# Patient Record
Sex: Female | Born: 1965 | Race: White | Hispanic: No | Smoking: Never smoker
Health system: Southern US, Community
[De-identification: ages and names within clinical notes are randomized; demographics above are authoritative.]

## PROBLEM LIST (undated history)

## (undated) DIAGNOSIS — I341 Nonrheumatic mitral (valve) prolapse: Secondary | ICD-10-CM

## (undated) DIAGNOSIS — D0510 Intraductal carcinoma in situ of unspecified breast: Secondary | ICD-10-CM

## (undated) DIAGNOSIS — C801 Malignant (primary) neoplasm, unspecified: Secondary | ICD-10-CM

## (undated) DIAGNOSIS — K589 Irritable bowel syndrome without diarrhea: Secondary | ICD-10-CM

## (undated) DIAGNOSIS — B019 Varicella without complication: Secondary | ICD-10-CM

## (undated) DIAGNOSIS — I7300 Raynaud's syndrome without gangrene: Secondary | ICD-10-CM

## (undated) DIAGNOSIS — D509 Iron deficiency anemia, unspecified: Principal | ICD-10-CM

## (undated) DIAGNOSIS — J302 Other seasonal allergic rhinitis: Secondary | ICD-10-CM

## (undated) DIAGNOSIS — N200 Calculus of kidney: Secondary | ICD-10-CM

## (undated) HISTORY — PX: HERNIA REPAIR: SHX51

## (undated) HISTORY — DX: Iron deficiency anemia, unspecified: D50.9

## (undated) HISTORY — DX: Intraductal carcinoma in situ of unspecified breast: D05.10

## (undated) HISTORY — DX: Other seasonal allergic rhinitis: J30.2

## (undated) HISTORY — DX: Calculus of kidney: N20.0

## (undated) HISTORY — PX: MASTECTOMY, PARTIAL: SHX709

## (undated) HISTORY — DX: Varicella without complication: B01.9

## (undated) HISTORY — DX: Raynaud's syndrome without gangrene: I73.00

## (undated) HISTORY — PX: TONSILLECTOMY: SUR1361

---

## 1997-11-09 ENCOUNTER — Encounter: Admission: RE | Admit: 1997-11-09 | Discharge: 1997-11-29 | Payer: Self-pay | Admitting: Gynecology

## 1997-12-26 ENCOUNTER — Other Ambulatory Visit: Admission: RE | Admit: 1997-12-26 | Discharge: 1997-12-26 | Payer: Self-pay | Admitting: Gynecology

## 1998-12-27 ENCOUNTER — Other Ambulatory Visit: Admission: RE | Admit: 1998-12-27 | Discharge: 1998-12-27 | Payer: Self-pay | Admitting: Gynecology

## 2000-01-04 ENCOUNTER — Other Ambulatory Visit: Admission: RE | Admit: 2000-01-04 | Discharge: 2000-01-04 | Payer: Self-pay | Admitting: Gynecology

## 2009-12-29 ENCOUNTER — Ambulatory Visit: Payer: Self-pay | Admitting: Diagnostic Radiology

## 2009-12-29 ENCOUNTER — Emergency Department (HOSPITAL_BASED_OUTPATIENT_CLINIC_OR_DEPARTMENT_OTHER): Admission: EM | Admit: 2009-12-29 | Discharge: 2009-12-29 | Payer: Self-pay | Admitting: Emergency Medicine

## 2010-08-23 ENCOUNTER — Ambulatory Visit: Admit: 2010-08-23 | Payer: Self-pay | Admitting: Psychology

## 2010-11-06 LAB — D-DIMER, QUANTITATIVE: D-Dimer, Quant: 0.24 ug/mL-FEU (ref 0.00–0.48)

## 2010-11-06 LAB — HEMOGLOBIN AND HEMATOCRIT, BLOOD
HCT: 29.7 % — ABNORMAL LOW (ref 36.0–46.0)
Hemoglobin: 9.5 g/dL — ABNORMAL LOW (ref 12.0–15.0)

## 2011-07-20 DIAGNOSIS — N200 Calculus of kidney: Secondary | ICD-10-CM

## 2011-07-20 HISTORY — DX: Calculus of kidney: N20.0

## 2011-07-23 ENCOUNTER — Encounter: Payer: Self-pay | Admitting: *Deleted

## 2011-07-23 ENCOUNTER — Emergency Department (INDEPENDENT_AMBULATORY_CARE_PROVIDER_SITE_OTHER): Payer: BC Managed Care – PPO

## 2011-07-23 ENCOUNTER — Emergency Department (HOSPITAL_BASED_OUTPATIENT_CLINIC_OR_DEPARTMENT_OTHER)
Admission: EM | Admit: 2011-07-23 | Discharge: 2011-07-23 | Disposition: A | Discharge status: Home / Self Care | Payer: BC Managed Care – PPO | Attending: Emergency Medicine | Admitting: Emergency Medicine

## 2011-07-23 DIAGNOSIS — R109 Unspecified abdominal pain: Secondary | ICD-10-CM | POA: Insufficient documentation

## 2011-07-23 DIAGNOSIS — N1330 Unspecified hydronephrosis: Secondary | ICD-10-CM | POA: Insufficient documentation

## 2011-07-23 DIAGNOSIS — K589 Irritable bowel syndrome without diarrhea: Secondary | ICD-10-CM | POA: Insufficient documentation

## 2011-07-23 DIAGNOSIS — N201 Calculus of ureter: Secondary | ICD-10-CM

## 2011-07-23 DIAGNOSIS — N134 Hydroureter: Secondary | ICD-10-CM

## 2011-07-23 HISTORY — DX: Irritable bowel syndrome, unspecified: K58.9

## 2011-07-23 HISTORY — DX: Nonrheumatic mitral (valve) prolapse: I34.1

## 2011-07-23 LAB — BASIC METABOLIC PANEL
CO2: 21 mEq/L (ref 19–32)
Calcium: 10 mg/dL (ref 8.4–10.5)

## 2011-07-23 LAB — URINALYSIS, ROUTINE W REFLEX MICROSCOPIC
Glucose, UA: NEGATIVE mg/dL
Nitrite: NEGATIVE
Protein, ur: NEGATIVE mg/dL
Specific Gravity, Urine: 1.019 (ref 1.005–1.030)
Urobilinogen, UA: 0.2 mg/dL (ref 0.0–1.0)
pH: 8.5 — ABNORMAL HIGH (ref 5.0–8.0)

## 2011-07-23 LAB — URINE MICROSCOPIC-ADD ON

## 2011-07-23 LAB — CBC
HCT: 37.1 % (ref 36.0–46.0)
MCH: 26.3 pg (ref 26.0–34.0)
MCHC: 34 g/dL (ref 30.0–36.0)
MCV: 77.3 fL — ABNORMAL LOW (ref 78.0–100.0)

## 2011-07-23 LAB — PREGNANCY, URINE: Preg Test, Ur: NEGATIVE

## 2011-07-23 MED ORDER — ONDANSETRON HCL 4 MG PO TABS: 4.0000 mg | ORAL_TABLET | Freq: Four times a day (QID) | ORAL | Status: AC

## 2011-07-23 MED ORDER — SODIUM CHLORIDE 0.9 % IV BOLUS (SEPSIS)
1000.0000 mL | Freq: Once | INTRAVENOUS | Status: AC
  Administered 2011-07-23: 1000 mL via INTRAVENOUS

## 2011-07-23 MED ORDER — KETOROLAC TROMETHAMINE 30 MG/ML IJ SOLN
30.0000 mg | Freq: Once | INTRAMUSCULAR | Status: AC
  Administered 2011-07-23: 30 mg via INTRAVENOUS
  Filled 2011-07-23: qty 1

## 2011-07-23 MED ORDER — IBUPROFEN 600 MG PO TABS: 600.0000 mg | ORAL_TABLET | Freq: Three times a day (TID) | ORAL | Status: AC | PRN

## 2011-07-23 MED ORDER — HYDROMORPHONE HCL PF 1 MG/ML IJ SOLN
1.0000 mg | Freq: Once | INTRAMUSCULAR | Status: AC
  Administered 2011-07-23: 1 mg via INTRAVENOUS
  Filled 2011-07-23: qty 1

## 2011-07-23 MED ORDER — ONDANSETRON HCL 4 MG/2ML IJ SOLN
4.0000 mg | Freq: Once | INTRAMUSCULAR | Status: AC
  Administered 2011-07-23: 4 mg via INTRAVENOUS
  Filled 2011-07-23: qty 2

## 2011-07-23 MED ORDER — OXYCODONE-ACETAMINOPHEN 5-325 MG PO TABS: 1.0000 | ORAL_TABLET | ORAL | Status: AC | PRN

## 2011-07-23 NOTE — ED Notes (Signed)
Patient states she developed sudden on set of left flank pain approximately one hour pta.  Now pain is radiating LLQ of abdomen which are associated with difficulty urinating.

## 2011-07-23 NOTE — ED Provider Notes (Signed)
History     CSN: [id number] Arrival date & time: 07/23/2011  9:21 AM   First MD Initiated Contact with Patient 07/23/11 [phone]      Chief Complaint  Patient presents with  . Flank Pain    left    (Consider location/radiation/quality/duration/timing/severity/associated sxs/prior treatment) The history is provided by the patient.   patient reports developing acute onset left flank pain with radiation down to the left groin.  She's been nauseated she has not vomited.  She's never had symptoms consistent with this before.  She has no discomfort with urination or frequency of urination.  She's had no fever or chills.  She reports this pain came on abruptly.  She still does have menstrual cycles.  She denies recent irregular vaginal bleeding and her last normal menstrual period was last week.  She has no prior history of ureteral stones.  Denies chest pain shortness of breath.  Her pain is severe.  Nothing worsens her symptoms.  Nothing improves her symptoms.  Her pain is constant  Past Medical History  Diagnosis Date  . Migraine   . Mitral valve prolapse   . IBS (irritable bowel syndrome)     Past Surgical History  Procedure Date  . Tonsillectomy     No family history on file.  History  Substance Use Topics  . Smoking status: Never Smoker   . Smokeless tobacco: Not on file  . Alcohol Use: No    OB History    Grav Para Term Preterm Abortions TAB SAB Ect Mult Living                  Review of Systems  Genitourinary: Positive for flank pain.  All other systems reviewed and are negative.    Allergies  Penicillins  Home Medications  No current outpatient prescriptions on file.  BP 90/58  Pulse 82  Temp 98.5 F (36.9 C)  Resp 26  Ht 5\' 5"  (1.651 m)  Wt 130 lb (58.968 kg)  BMI 21.63 kg/m2  SpO2 100%  LMP 07/17/2011  Physical Exam  Nursing note and vitals reviewed. Constitutional: She is oriented to person, place, and time. She appears well-developed and  well-nourished. No distress.       Writhing in pain  HENT:  Head: Normocephalic and atraumatic.  Eyes: EOM are normal.  Neck: Normal range of motion.  Cardiovascular: Normal rate, regular rhythm and normal heart sounds.   Pulmonary/Chest: Effort normal and breath sounds normal.  Abdominal: Soft. She exhibits no distension. There is no tenderness.  Musculoskeletal: Normal range of motion.  Neurological: She is alert and oriented to person, place, and time.  Skin: Skin is warm and dry.  Psychiatric: She has a normal mood and affect. Judgment normal.    ED Course  Procedures (including critical care time)  Labs Reviewed  URINALYSIS, ROUTINE W REFLEX MICROSCOPIC - Abnormal; Notable for the following:    APPearance TURBID (*)    pH 8.5 (*)    Hgb urine dipstick LARGE (*)    All other components within normal limits  CBC - Abnormal; Notable for the following:    MCV 77.3 (*)    All other components within normal limits  BASIC METABOLIC PANEL - Abnormal; Notable for the following:    Glucose, Bld 183 (*)    All other components within normal limits  URINE MICROSCOPIC-ADD ON - Abnormal; Notable for the following:    Bacteria, UA FEW (*)    All other components within normal  limits  PREGNANCY, URINE   Ct Abdomen Pelvis Wo Contrast  07/23/2011  *RADIOLOGY REPORT*  Clinical Data: Left flank pain  CT ABDOMEN AND PELVIS WITHOUT CONTRAST  Technique:  Multidetector CT imaging of the abdomen and pelvis was performed following the standard protocol without intravenous contrast.  Comparison: None.  Findings: Lung bases are unremarkable.  Sagittal images of the spine are unremarkable.  Post hernia repair changes noted lower pelvic wall.  Unenhanced liver, spleen, pancreas and adrenals are unremarkable. No calcified gallstones are noted within contracted gallbladder. No aortic aneurysm.  No nephrolithiasis.  There is minimal left hydronephrosis and minimal left hydroureter.  No proximal or mid  calcified ureteral calculi are noted.  No small bowel obstruction.  No ascites or free air.  No adenopathy.  There is no pericecal inflammation. Essure micro-inserts are noted bilateral fallopian tube region.  The urinary bladder is under distended limiting its assessment.  In axial image 69 there is a 3 mm calcification in distal left ureter region about 5 mm from left UVJ highly suspicious for distal left ureteral calculus.  Multiple bilateral pelvic phleboliths are noted.  IMPRESSION:  1.  No nephrolithiasis.  There is minimal left hydronephrosis and left hydroureter.  There is 3 mm calcification in distal left ureteral region about 5 mm from left UVJ suspicious for distal left ureteral calculus. 2.  No small bowel obstruction.  No ascites or free air. 3.  Essure micro-inserts in expected location of fallopian tubes.  Original Report Authenticated By: Natasha Mead, M.D.   I personally reviewed the CT scan  1. Ureteral stone       MDM  Patient symptoms are concerning for left ureteral lithiasis.  Pain medicine and nausea medicine given.  We'll obtain CT scan to stay time to better evaluate.  10:39 AM The patient feels much better at this time.  The patient be discharged home with urology followup.  She understands the importance of urology followup and to return to the ER for new or worsening symptoms.  She was instructed to return to the ER for development of fever severe nausea vomiting or worsening abdominal pain. Pt and family understand. All questions answered        Lyanne Co, MD 07/23/11 1042

## 2011-09-05 ENCOUNTER — Ambulatory Visit (INDEPENDENT_AMBULATORY_CARE_PROVIDER_SITE_OTHER): Payer: 59 | Admitting: Family Medicine

## 2011-09-05 ENCOUNTER — Encounter: Payer: Self-pay | Admitting: Family Medicine

## 2011-09-05 DIAGNOSIS — Z0000 Encounter for general adult medical examination without abnormal findings: Secondary | ICD-10-CM

## 2011-09-05 DIAGNOSIS — R799 Abnormal finding of blood chemistry, unspecified: Secondary | ICD-10-CM

## 2011-09-05 DIAGNOSIS — R790 Abnormal level of blood mineral: Secondary | ICD-10-CM

## 2011-09-05 DIAGNOSIS — Z23 Encounter for immunization: Secondary | ICD-10-CM

## 2011-09-05 LAB — POCT URINALYSIS DIPSTICK
Glucose, UA: NEGATIVE
Leukocytes, UA: NEGATIVE
Nitrite, UA: NEGATIVE
Protein, UA: NEGATIVE
Urobilinogen, UA: 0.2

## 2011-09-05 LAB — LIPID PANEL
Cholesterol: 209 mg/dL — ABNORMAL HIGH (ref 0–200)
Triglycerides: 41 mg/dL (ref 0.0–149.0)

## 2011-09-05 LAB — CBC WITH DIFFERENTIAL/PLATELET
Basophils Absolute: 0 10*3/uL (ref 0.0–0.1)
Basophils Relative: 0.1 % (ref 0.0–3.0)
HCT: 33.9 % — ABNORMAL LOW (ref 36.0–46.0)
Hemoglobin: 11.3 g/dL — ABNORMAL LOW (ref 12.0–15.0)
MCHC: 33.2 g/dL (ref 30.0–36.0)
MCV: 77.2 fl — ABNORMAL LOW (ref 78.0–100.0)
Monocytes Absolute: 0.4 10*3/uL (ref 0.1–1.0)
Neutrophils Relative %: 62 % (ref 43.0–77.0)
Platelets: 225 10*3/uL (ref 150.0–400.0)
RBC: 4.39 Mil/uL (ref 3.87–5.11)
RDW: 15.4 % — ABNORMAL HIGH (ref 11.5–14.6)
WBC: 4.9 10*3/uL (ref 4.5–10.5)

## 2011-09-05 LAB — BASIC METABOLIC PANEL
BUN: 15 mg/dL (ref 6–23)
Calcium: 9.1 mg/dL (ref 8.4–10.5)
Chloride: 104 mEq/L (ref 96–112)
Creatinine, Ser: 0.6 mg/dL (ref 0.4–1.2)
Glucose, Bld: 90 mg/dL (ref 70–99)
Sodium: 137 mEq/L (ref 135–145)

## 2011-09-05 LAB — HEPATIC FUNCTION PANEL
Albumin: 3.9 g/dL (ref 3.5–5.2)
Bilirubin, Direct: 0.1 mg/dL (ref 0.0–0.3)

## 2011-09-05 LAB — TSH: TSH: 1.6 u[IU]/mL (ref 0.35–5.50)

## 2011-09-05 NOTE — Assessment & Plan Note (Signed)
Pt's PE WNL.  UTD on health maintenance.  Check labs.  Anticipatory guidance provided.  

## 2011-09-05 NOTE — Progress Notes (Signed)
  Subjective:    Patient ID: Pamela Ray, female    DOB: 1965/11/11, 46 y.o.   MRN: [id number]  HPI New to establish.  Previous MD- none.  GYN- [facility], last pap 08/2010, mammo 12/12.  GI- Dr Loreta Ave (2010)  Migraines- following w/ Dr Neale Burly, getting Botox q3 months.  On Baclofen, imitrex, zonegran prn.  Was previously averaging 19 migraines monthly.   Review of Systems Patient reports no vision/ hearing changes,fever, weight change,  persistant/recurrent hoarseness , swallowing issues, chest pain, palpitations, edema, persistant/recurrent cough, hemoptysis, dyspnea (rest/exertional/paroxysmal nocturnal), gastrointestinal bleeding (melena, rectal bleeding), abdominal pain, significant heartburn, bowel changes, GU symptoms (dysuria, hematuria, incontinence), Gyn symptoms (abnormal  bleeding, pain),  syncope, focal weakness, memory loss, numbness & tingling, skin/hair/nail changes, abnormal bruising or bleeding, anxiety, or depression.  L post-auricular LAD (chronic) Hx of kidney stone    Objective:   Physical Exam General Appearance:    Alert, cooperative, no distress, appears stated age  Head:    Normocephalic, without obvious abnormality, atraumatic  Eyes:    PERRL, conjunctiva/corneas clear, EOM's intact, fundi    benign, both eyes  Ears:    Normal TM's and external ear canals, both ears  Nose:   Nares normal, septum midline, mucosa normal, no drainage    or sinus tenderness  Throat:   Lips, mucosa, and tongue normal; teeth and gums normal  Neck:   Supple, symmetrical, trachea midline, no adenopathy;    Thyroid: no enlargement/tenderness/nodules  Back:     Symmetric, no curvature, ROM normal, no CVA tenderness  Lungs:     Clear to auscultation bilaterally, respirations unlabored  Chest Wall:    No tenderness or deformity   Heart:    Regular rate and rhythm, S1 and S2 normal, no murmur, rub   or gallop  Breast Exam:    Deferred to GYN  Abdomen:     Soft, non-tender, bowel sounds  active all four quadrants,    no masses, no organomegaly  Genitalia:    Deferred to GYN  Rectal:    Extremities:   Extremities normal, atraumatic, no cyanosis or edema  Pulses:   2+ and symmetric all extremities  Skin:   Skin color, texture, turgor normal, no rashes or lesions  Lymph nodes:   Cervical, supraclavicular, and axillary nodes normal  Neurologic:   CNII-XII intact, normal strength, sensation and reflexes    throughout          Assessment & Plan:

## 2011-09-05 NOTE — Patient Instructions (Signed)
We'll notify you of your lab results Keep up the good work!  You look great! Call with any questions or concerns Think of us as your home base Welcome!  We're glad to have you!!! 

## 2011-09-09 LAB — VITAMIN D 1,25 DIHYDROXY
Vitamin D 1, 25 (OH)2 Total: 31 pg/mL (ref 18–72)
Vitamin D3 1, 25 (OH)2: 31 pg/mL

## 2011-09-10 LAB — FERRITIN: Ferritin: 5.1 ng/mL — ABNORMAL LOW (ref 10.0–291.0)

## 2011-09-11 ENCOUNTER — Encounter: Payer: Self-pay | Admitting: *Deleted

## 2011-09-11 NOTE — Progress Notes (Signed)
Addended by: Derry Lory A on: 09/11/2011 02:45 PM   Modules accepted: Orders

## 2011-09-16 ENCOUNTER — Telehealth: Payer: Self-pay | Admitting: Hematology & Oncology

## 2011-09-16 ENCOUNTER — Encounter: Payer: Self-pay | Admitting: *Deleted

## 2011-09-16 NOTE — Telephone Encounter (Signed)
Pt aware of 10-11-11 appointment

## 2011-10-10 ENCOUNTER — Telehealth: Payer: Self-pay | Admitting: Hematology & Oncology

## 2011-10-10 NOTE — Telephone Encounter (Signed)
Confirmed 11/08/11 appt.

## 2011-10-11 ENCOUNTER — Ambulatory Visit (HOSPITAL_BASED_OUTPATIENT_CLINIC_OR_DEPARTMENT_OTHER): Payer: 59 | Admitting: Hematology & Oncology

## 2011-10-11 ENCOUNTER — Other Ambulatory Visit (HOSPITAL_BASED_OUTPATIENT_CLINIC_OR_DEPARTMENT_OTHER): Payer: 59 | Admitting: Lab

## 2011-10-11 ENCOUNTER — Ambulatory Visit: Payer: 59

## 2011-10-11 ENCOUNTER — Encounter: Payer: Self-pay | Admitting: Hematology & Oncology

## 2011-10-11 DIAGNOSIS — G43909 Migraine, unspecified, not intractable, without status migrainosus: Secondary | ICD-10-CM | POA: Insufficient documentation

## 2011-10-11 DIAGNOSIS — I7300 Raynaud's syndrome without gangrene: Secondary | ICD-10-CM

## 2011-10-11 DIAGNOSIS — D509 Iron deficiency anemia, unspecified: Secondary | ICD-10-CM

## 2011-10-11 HISTORY — DX: Raynaud's syndrome without gangrene: I73.00

## 2011-10-11 HISTORY — DX: Iron deficiency anemia, unspecified: D50.9

## 2011-10-11 LAB — IRON AND TIBC
Iron: 20 ug/dL — ABNORMAL LOW (ref 42–145)
UIBC: 521 ug/dL — ABNORMAL HIGH (ref 125–400)

## 2011-10-11 LAB — CBC WITH DIFFERENTIAL (CANCER CENTER ONLY)
EOS%: 4 % (ref 0.0–7.0)
Eosinophils Absolute: 0.2 10*3/uL (ref 0.0–0.5)
LYMPH#: 1.9 10*3/uL (ref 0.9–3.3)
LYMPH%: 34.7 % (ref 14.0–48.0)
MCH: 23.4 pg — ABNORMAL LOW (ref 26.0–34.0)
MCV: 73 fL — ABNORMAL LOW (ref 81–101)
MONO#: 0.5 10*3/uL (ref 0.1–0.9)
NEUT#: 2.9 10*3/uL (ref 1.5–6.5)
NEUT%: 52.6 % (ref 39.6–80.0)
RDW: 15.5 % (ref 11.1–15.7)
WBC: 5.6 10*3/uL (ref 3.9–10.0)

## 2011-10-11 LAB — FERRITIN: Ferritin: 2 ng/mL — ABNORMAL LOW (ref 10–291)

## 2011-10-11 NOTE — Progress Notes (Signed)
This office note has been dictated.

## 2011-10-14 NOTE — Progress Notes (Signed)
CC:   Pamela Ray, M.D. [facility] Pamela Ray, Pamela Ray, [facility] Santiago Glad, Pamela Ray  DIAGNOSIS:  Microcytic anemia, likely iron deficiency.  HISTORY OF PRESENT ILLNESS:  Pamela Ray is a very nice, 46 year old, white female.  She is from [place] originally.  I had a great time talking to her about [place].  My relatives are from the same county that she is from.  She has been seen by Dr. Beverely Ray.  She has been having problems with anemia for quite a while.  She has a hard time taking oral iron.  She had been found to have iron-deficiency anemia.  Going through her records, she had a ferritin done back in January, which was 5.1.  She did have a CBC done previously.  This was done on 09/05/2011.  This showed a white cell count 4.9, hemoglobin 11.3, hematocrit 33.9, platelet count 225.  MCV was 77.  She had a normal white cell differential.  Pamela Ray has just been feeling tired.  She has been feeling tired for quite a while.  She just does not have a lot of energy.  She does have her monthly cycles.  However, she gets migraines with these.  She had been on a medication previously to try to regulate her cycle better so she would not get migraines.  She has not had any bleeding other than her monthly cycles.  She has had no cough.  There has been no weight loss or weight gain.  She has had no swallowing difficulties.  She has had no skin changes.  Her skin has not been dry or moist.  She is not a vegetarian.  Her husband actually is a Investment banker, operational.  He has a very interesting job in which he actually goes about, I think, cooking healthy for clients.  She does have 2 kids.  One is age, I think, 84 and one age 36.  They are healthy themselves.  She has not had any mouth sores.  Her tongue is not sore.  She has not been chewing ice.  Dr. Beverely Ray ask that we see Pamela Ray to try to help out with her anemia. We are more than happy to do this.  Again, Pamela Ray says that  she has been on iron before.  She says she just has a very are time taking oral iron because it causes a lot of cramping and abdominal pain.  She does state some Raynaud's type symptoms.  She says that her fingers do get blue when she exposes them to cold.  She has had no rashes.  She has had no swelling of the extremities.  PAST MEDICAL HISTORY:  Remarkable for: 1. Migraines, induced by menstrual cycles. 2. Chronic anemia.  ALLERGIES: 1. KEFLEX: 2. PENICILLIN.  MEDICATIONS: 1. Baclofen 10 mg p.o. t.i.d. p.r.n. 2. Skelaxin 800 mg p.o. b.i.d. p.r.n. 3. Phenergan 25 mg p.o. q.6 hours p.r.n. 4. Imitrex 100 mg p.o. q.2 hours p.r.n. 5. Sumavel 6 mg subcutaneously q.2 hours p.r.n. migraines. 6. Zonegran 400 mg p.o. at bedtime.  SOCIAL HISTORY:  Negative for tobacco use.  There is really no alcohol use.  She has no obvious occupational exposures.  She works for [employer].  FAMILY HISTORY:  Remarkable for, I think, a maternal grandmother with cervical cancer when she was fairly elderly.  There is no obvious anemia in the family.  REVIEW OF SYSTEMS:  As stated in the history of present illness.  No additional findings are noted on  a 12 system review.  PHYSICAL EXAMINATION:  General Appearance:  This is a well-developed, well-nourished, white female in no obvious distress.  Vital Signs: Temperature 97.7.  Pulse 78.  Respiratory rate 18.  Blood pressure 103/68.  Weight is 142.  Head and Neck Exam:  A normocephalic, atraumatic skull.  There are no ocular or oral lesions.  There are no palpable cervical or supraclavicular lymph nodes.  Lungs:  Clear to percussion and auscultation bilaterally.  Cardiac Exam:  Regular rhythm with a normal S1 and S2.  There are no murmurs, rubs, or bruits. Abdominal Exam:  Soft with good bowel sounds.  There is no palpable abdominal mass.  There is no fluid wave.  There is no palpable hepatosplenomegaly.  Back Exam:  No tenderness  over the spine, ribs, or hips.  Extremities:  No clubbing, cyanosis, or edema.  She has good range of motion of her joints.  Skin Exam:  A few hyperpigmented lesions.  None appear suspicious.  She has no rashes, ecchymosis, or petechia.  Neurological Exam:  No focal neurological deficits.  LABORATORY STUDIES:  White cell count is 5.6, hemoglobin 10.8, hematocrit 33.4, platelet count is 280.  MCV is 73.  Peripheral smear shows moderate anisocytosis and poikilocytosis.  She has microcytic red cells.  There are no schistocytes.  I do not see polychromasia.  I do not see any target cells.  There is no rouleaux formation.  White cells appear normal in morphology and maturation.  There are no immature myeloid or lymphoid forms.  Platelets are adequate in number and size. She has a few large platelets.  Platelets are well granulated.  IMPRESSION:  Pamela Ray is a very nice, 46 year old, white female.  She has microcytic anemia.  By her blood smear, one would have to suspect that this is truly iron deficiency.  She had a ferritin done about a month ago which showed a very Ray ferritin.  She is just not able to absorb iron.  She is losing iron through her monthly cycles.  We will see what her ferritin is.  If we do find that it is Ray, we will give her IV iron via Feraheme.  I believe this would be the best way to try to replenish her iron stores.  I do not see an indication for any additional testing on Pamela Ray.  I do not see a need for a bone marrow test.  I had a really nice time with Pamela Ray.  It was fun talking to her about [place].  We will plan to make a followup appointment for Pamela Ray once we get her iron results back.  We spent about an hour so with Pamela Ray today.    ______________________________ Josph Macho, M.D. PRE/MEDQ  D:  10/11/2011  T:  10/11/2011  Job:  [id number]

## 2011-10-17 ENCOUNTER — Other Ambulatory Visit: Payer: Self-pay | Admitting: Hematology & Oncology

## 2011-10-17 DIAGNOSIS — D509 Iron deficiency anemia, unspecified: Secondary | ICD-10-CM

## 2011-10-22 ENCOUNTER — Ambulatory Visit (HOSPITAL_BASED_OUTPATIENT_CLINIC_OR_DEPARTMENT_OTHER): Payer: 59

## 2011-10-22 VITALS — BP 104/69 | HR 88 | Temp 98.3°F

## 2011-10-22 DIAGNOSIS — D509 Iron deficiency anemia, unspecified: Secondary | ICD-10-CM

## 2011-10-22 MED ORDER — SODIUM CHLORIDE 0.9 % IV SOLN
1020.0000 mg | Freq: Once | INTRAVENOUS | Status: AC
  Administered 2011-10-22: 1020 mg via INTRAVENOUS
  Filled 2011-10-22: qty 34

## 2011-10-22 MED ORDER — SODIUM CHLORIDE 0.9 % IV SOLN
Freq: Once | INTRAVENOUS | Status: AC
  Administered 2011-10-22: 13:00:00 via INTRAVENOUS

## 2011-11-12 ENCOUNTER — Ambulatory Visit (HOSPITAL_BASED_OUTPATIENT_CLINIC_OR_DEPARTMENT_OTHER): Payer: 59 | Admitting: Hematology & Oncology

## 2011-11-12 ENCOUNTER — Other Ambulatory Visit (HOSPITAL_BASED_OUTPATIENT_CLINIC_OR_DEPARTMENT_OTHER): Payer: 59 | Admitting: Lab

## 2011-11-12 VITALS — BP 106/65 | HR 77 | Temp 97.3°F | Wt 142.0 lb

## 2011-11-12 DIAGNOSIS — D509 Iron deficiency anemia, unspecified: Secondary | ICD-10-CM

## 2011-11-12 LAB — CBC WITH DIFFERENTIAL (CANCER CENTER ONLY)
BASO#: 0 10*3/uL (ref 0.0–0.2)
Eosinophils Absolute: 0.1 10*3/uL (ref 0.0–0.5)
HGB: 11.6 g/dL (ref 11.6–15.9)
MCH: 25.9 pg — ABNORMAL LOW (ref 26.0–34.0)
MCV: 79 fL — ABNORMAL LOW (ref 81–101)
MONO#: 0.6 10*3/uL (ref 0.1–0.9)
MONO%: 10.2 % (ref 0.0–13.0)
NEUT#: 3.3 10*3/uL (ref 1.5–6.5)
RBC: 4.48 10*6/uL (ref 3.70–5.32)

## 2011-11-12 NOTE — Progress Notes (Signed)
This office note has been dictated.

## 2011-11-12 NOTE — Progress Notes (Signed)
CC:   Neena Rhymes, M.D. Anselmo Rod, MD, Clementeen Graham Santiago Glad, MD  DIAGNOSIS:  Iron-deficiency anemia.  CURRENT THERAPY:  IV iron as indicated.  INTERIM HISTORY:  Ms. Edinger comes in for followup.  When we first saw her back in February, she really was iron deficient.  We did iron studies on her.  Her ferritin was only 2.  Her iron saturation was 4. We did go ahead and give her IV iron.  She got IV Feraheme back on 10/22/2011.  She got a dose of 1020 mg.  She is feeling a little bit better.  She reports having a migraine right now.  This seems to be improving.  She takes several medications for this.  She does have her monthly cycles which have not changed.  She has no other bleeding.  She and her family did recently get a rescue pup.  They are happy with this.  She showed me a picture of him.  He is a cute little dog.  She has not had any "cravings."  She is not chewing ice.  She is having no change in bowel or bladder habits.  There have been no rashes.  PHYSICAL EXAMINATION:  General:  This is a well-developed, well- nourished white female in no obvious distress.  Vital Signs:  Show a temperature of 97.3, pulse 77, respiratory rate 18, blood pressure 106/65.  Weight is 142.  Head and Neck Exam:  Shows a normocephalic, atraumatic skull.  There are no ocular or oral lesions.  There are no palpable cervical or supraclavicular lymph nodes.  Lungs:  Clear bilaterally.  Cardiac Exam:  Regular rate and rhythm with normal S1 and S2.  There are no murmurs, rubs or bruits.  Abdominal Exam:  Soft with good bowel sounds.  There is no palpable abdominal mass.  There is no palpable hepatosplenomegaly.  Back Exam:  No tenderness over the spine, ribs or hips.  Extremities:  Show no clubbing, cyanosis, or edema. Neurological Exam:  Shows no focal neurological deficits.  LABORATORY STUDIES:  White cell count is 5.4, hemoglobin 11.6, hematocrit 35.3, platelet count 211.  MCV is  79.  IMPRESSION:  Ms. Karle is a charming 46 year old white female with iron- deficiency anemia.  She got IV iron.  This has improved her anemia.  Her MCV is also coming up.  Hopefully, since she just got this 3 weeks ago she will start feeling more of the effects in 1 or 2 weeks.  It would not surprise me if she needed another dose of iron.  Her ferritin was incredibly low; as such, 1 more dose of iron would not be a bad idea.  We will have to see what her ferritin levels are.  I will plan to see her back myself in 2 months.  Again, I believe that she may need to have another dose of iron. Hopefully, we will be able to do this, if necessary, this week as she does have some time off.    ______________________________ Josph Macho, M.D. PRE/MEDQ  D:  11/12/2011  T:  11/12/2011  Job:  [id number]

## 2011-11-18 ENCOUNTER — Telehealth: Payer: Self-pay | Admitting: *Deleted

## 2011-11-18 NOTE — Telephone Encounter (Addendum)
Message copied by Mirian Capuchin on Mon Nov 18, 2011  4:23 PM ------      Message from: Arlan Organ R      Created: Wed Nov 13, 2011  8:29 AM       Call:  Iron is much better!!  Ferritin is 200.  Will keep f/u appt as scheduled.  Cindee Lame  This message left on pt's home answering machine.

## 2011-12-17 ENCOUNTER — Encounter (HOSPITAL_BASED_OUTPATIENT_CLINIC_OR_DEPARTMENT_OTHER): Payer: Self-pay | Admitting: Student

## 2011-12-17 ENCOUNTER — Emergency Department (HOSPITAL_BASED_OUTPATIENT_CLINIC_OR_DEPARTMENT_OTHER)
Admission: EM | Admit: 2011-12-17 | Discharge: 2011-12-17 | Disposition: A | Discharge status: Home / Self Care | Payer: 59 | Attending: Emergency Medicine | Admitting: Emergency Medicine

## 2011-12-17 ENCOUNTER — Emergency Department (INDEPENDENT_AMBULATORY_CARE_PROVIDER_SITE_OTHER): Payer: 59

## 2011-12-17 DIAGNOSIS — S93409A Sprain of unspecified ligament of unspecified ankle, initial encounter: Secondary | ICD-10-CM

## 2011-12-17 DIAGNOSIS — M25579 Pain in unspecified ankle and joints of unspecified foot: Secondary | ICD-10-CM | POA: Insufficient documentation

## 2011-12-17 DIAGNOSIS — K589 Irritable bowel syndrome without diarrhea: Secondary | ICD-10-CM | POA: Insufficient documentation

## 2011-12-17 DIAGNOSIS — X500XXA Overexertion from strenuous movement or load, initial encounter: Secondary | ICD-10-CM | POA: Insufficient documentation

## 2011-12-17 DIAGNOSIS — M25476 Effusion, unspecified foot: Secondary | ICD-10-CM | POA: Insufficient documentation

## 2011-12-17 DIAGNOSIS — M25473 Effusion, unspecified ankle: Secondary | ICD-10-CM | POA: Insufficient documentation

## 2011-12-17 DIAGNOSIS — M7730 Calcaneal spur, unspecified foot: Secondary | ICD-10-CM

## 2011-12-17 DIAGNOSIS — I059 Rheumatic mitral valve disease, unspecified: Secondary | ICD-10-CM | POA: Insufficient documentation

## 2011-12-17 MED ORDER — IBUPROFEN 800 MG PO TABS: 800.0000 mg | ORAL_TABLET | Freq: Once | ORAL | Status: DC

## 2011-12-17 NOTE — ED Notes (Signed)
Twisting injury s/p getting up - able to ambulate and bear weight.

## 2011-12-17 NOTE — Discharge Instructions (Signed)
Ankle Sprain An ankle sprain is an injury to the strong, fibrous tissues (ligaments) that hold the bones of your ankle joint together.  CAUSES Ankle sprain usually is caused by a fall or by twisting your ankle. People who participate in sports are more prone to these types of injuries.  SYMPTOMS  Symptoms of ankle sprain include:  Pain in your ankle. The pain may be present at rest or only when you are trying to stand or walk.   Swelling.   Bruising. Bruising may develop immediately or within 1 to 2 days after your injury.   Difficulty standing or walking.  DIAGNOSIS  Your caregiver will ask you details about your injury and perform a physical exam of your ankle to determine if you have an ankle sprain. During the physical exam, your caregiver will press and squeeze specific areas of your foot and ankle. Your caregiver will try to move your ankle in certain ways. An X-ray exam may be done to be sure a bone was not broken or a ligament did not separate from one of the bones in your ankle (avulsion).  TREATMENT  Certain types of braces can help stabilize your ankle. Your caregiver can make a recommendation for this. Your caregiver may recommend the use of medication for pain. If your sprain is severe, your caregiver may refer you to a surgeon who helps to restore function to parts of your skeletal system (orthopedist) or a physical therapist. HOME CARE INSTRUCTIONS  Apply ice to your injury for 1 to 2 days or as directed by your caregiver. Applying ice helps to reduce inflammation and pain.  Put ice in a plastic bag.   Place a towel between your skin and the bag.   Leave the ice on for 15 to 20 minutes at a time, every 2 hours while you are awake.   Take over-the-counter or prescription medicines for pain, discomfort, or fever only as directed by your caregiver.   Keep your injured leg elevated, when possible, to lessen swelling.   If your caregiver recommends crutches, use them as  instructed. Gradually, put weight on the affected ankle. Continue to use crutches or a cane until you can walk without feeling pain in your ankle.   If you have a plaster splint, wear the splint as directed by your caregiver. Do not rest it on anything harder than a pillow the first 24 hours. Do not put weight on it. Do not get it wet. You may take it off to take a shower or bath.   You may have been given an elastic bandage to wear around your ankle to provide support. If the elastic bandage is too tight (you have numbness or tingling in your foot or your foot becomes cold and blue), adjust the bandage to make it comfortable.   If you have an air splint, you may blow more air into it or let air out to make it more comfortable. You may take your splint off at night and before taking a shower or bath.   Wiggle your toes in the splint several times per day if you are able.  SEEK MEDICAL CARE IF:   You have an increase in bruising, swelling, or pain.   Your toes feel cold.   Pain relief is not achieved with medication.  SEEK IMMEDIATE MEDICAL CARE IF: Your toes are numb or blue or you have severe pain. MAKE SURE YOU:   Understand these instructions.   Will watch your condition.     Will get help right away if you are not doing well or get worse.  Document Released: 08/05/2005 Document Revised: 07/25/2011 Document Reviewed: 03/09/2008 [employer] Patient Information 2012 [employer]. 

## 2011-12-20 NOTE — ED Provider Notes (Signed)
History     CSN: [id number]  Arrival date & time 12/17/11  1954   First MD Initiated Contact with Patient 12/17/11 2223      Chief Complaint  Patient presents with  . Ankle Pain    right    (Consider location/radiation/quality/duration/timing/severity/associated sxs/prior treatment) HPI Comments: Pt was attempting to stand up and pushed off and had a pain and a "pop" senstaion and noise from ankle on lateral portion near heel on right.  Was able to walk, but with each step, more pain and now slightly swollen.  No knee pain, no numbness or weakness, no bruising or abrasions or lacerations noted.  No fall.  Patient is a 46 y.o. female presenting with ankle pain. The history is provided by the patient.  Ankle Pain  Pertinent negatives include no numbness.    Past Medical History  Diagnosis Date  . Migraine   . Mitral valve prolapse   . IBS (irritable bowel syndrome)   . Kidney stone 12/12  . Chicken pox   . Seasonal allergies   . Anemia, iron deficiency 10/11/2011  . Migraine 10/11/2011  . Raynaud's phenomenon (by history or observed) 10/11/2011    Past Surgical History  Procedure Date  . Tonsillectomy   . Hernia repair     double 1999    Family History  Problem Relation Age of Onset  . Rheum arthritis Mother   . Rheum arthritis Maternal Grandmother   . Rheum arthritis Paternal Grandmother   . Heart disease      pgm,pgf,mgm,mgf  . Stroke Maternal Grandmother   . Stroke Maternal Grandfather   . Stroke Maternal Grandfather   . Stroke Paternal Grandmother   . Stroke Paternal Grandfather   . Hypertension Maternal Grandmother   . Hypertension Maternal Grandfather   . Hypertension Paternal Grandmother   . Hypertension Paternal Grandfather   . Diabetes Maternal Grandmother   . Diabetes Maternal Grandfather   . Diabetes Paternal Grandmother   . Diabetes Paternal Grandfather     History  Substance Use Topics  . Smoking status: Never Smoker   . Smokeless tobacco:  Not on file  . Alcohol Use: No    OB History    Grav Para Term Preterm Abortions TAB SAB Ect Mult Living                  Review of Systems  Constitutional: Negative.   Musculoskeletal: Positive for joint swelling and arthralgias.  Skin: Negative for wound.  Neurological: Negative for weakness and numbness.    Allergies  Cephalexin and Penicillins  Home Medications   Current Outpatient Rx  Name Route Sig Dispense Refill  . ASPIRIN 81 MG PO TABS Oral Take 81 mg by mouth daily.    Marland Kitchen METAXALONE 800 MG PO TABS Oral Take 800 mg by mouth 2 (two) times daily as needed. For migraines    . PRESCRIPTION MEDICATION Topical Apply 1 application topically 2 (two) times daily as needed. Eczema cream    . SUMATRIPTAN SUCCINATE 100 MG PO TABS Oral Take 100 mg by mouth every 2 (two) hours as needed. Take 1 tablet at onset of migraine  may repeat in 2 hours  Limit use to 2 days a week     . ZONISAMIDE 100 MG PO CAPS Oral Take 400 mg by mouth at bedtime.      . SUMATRIPTAN SUCCINATE 6 MG/0.5ML [place] DEVI Subcutaneous Inject 6 mg into the skin every 2 (two) hours as needed. Use 1 injection  subcutaneously at onset of migraine may repeat in 2 hours as directed       BP 106/58  Pulse 80  Temp(Src) 98.3 F (36.8 C) (Oral)  Resp 20  Ht 5\' 6"  (1.676 m)  Wt 130 lb (58.968 kg)  BMI 20.98 kg/m2  SpO2 99%  LMP 12/14/2011  Physical Exam  Nursing note and vitals reviewed. Constitutional: She appears well-developed and well-nourished.  HENT:  Head: Normocephalic and atraumatic.  Pulmonary/Chest: Effort normal.  Musculoskeletal: Normal range of motion.       Right ankle: She exhibits normal range of motion, no ecchymosis, no deformity, no laceration and normal pulse. No lateral malleolus, no medial malleolus, no head of 5th metatarsal and no proximal fibula tenderness found. Achilles tendon normal.       Feet:  Neurological: She is alert.  Skin: Skin is warm and dry. No rash noted. No pallor.    Psychiatric: She has a normal mood and affect.    ED Course  Procedures (including critical care time)  Labs Reviewed - No data to display No results found.   1. Ankle sprain       MDM  Likely ligamentous strain.  Ankle film performed.  I reviewed myself, discussed with pt, no acute fracture noted per radiologist.  ASO provided, pt has been able to ambulate.  Follow up encouraged if needed.  RICE at home.          Gavin Pound. Terryl Niziolek, MD 12/20/11 1512

## 2012-01-17 ENCOUNTER — Ambulatory Visit (HOSPITAL_BASED_OUTPATIENT_CLINIC_OR_DEPARTMENT_OTHER): Payer: 59 | Admitting: Hematology & Oncology

## 2012-01-17 ENCOUNTER — Other Ambulatory Visit (HOSPITAL_BASED_OUTPATIENT_CLINIC_OR_DEPARTMENT_OTHER): Payer: 59 | Admitting: Lab

## 2012-01-17 VITALS — BP 102/66 | HR 77 | Temp 97.1°F | Ht 66.0 in | Wt 141.0 lb

## 2012-01-17 DIAGNOSIS — D509 Iron deficiency anemia, unspecified: Secondary | ICD-10-CM

## 2012-01-17 DIAGNOSIS — R5383 Other fatigue: Secondary | ICD-10-CM

## 2012-01-17 DIAGNOSIS — R0602 Shortness of breath: Secondary | ICD-10-CM

## 2012-01-17 DIAGNOSIS — IMO0001 Reserved for inherently not codable concepts without codable children: Secondary | ICD-10-CM

## 2012-01-17 DIAGNOSIS — R5381 Other malaise: Secondary | ICD-10-CM

## 2012-01-17 LAB — CBC WITH DIFFERENTIAL (CANCER CENTER ONLY)
BASO%: 0.4 % (ref 0.0–2.0)
Eosinophils Absolute: 0.1 10*3/uL (ref 0.0–0.5)
HCT: 39.1 % (ref 34.8–46.6)
LYMPH#: 1.3 10*3/uL (ref 0.9–3.3)
MCV: 84 fL (ref 81–101)
MONO#: 0.5 10*3/uL (ref 0.1–0.9)
NEUT%: 59.1 % (ref 39.6–80.0)
RBC: 4.65 10*6/uL (ref 3.70–5.32)
RDW: 15.5 % (ref 11.1–15.7)
WBC: 4.8 10*3/uL (ref 3.9–10.0)

## 2012-01-17 NOTE — Progress Notes (Signed)
This office note has been dictated.

## 2012-01-17 NOTE — Progress Notes (Signed)
CC:   Neena Rhymes, M.D. Anselmo Rod, MD, Clementeen Graham Santiago Glad, MD  DIAGNOSIS:  Iron-deficiency anemia.  CURRENT THERAPY:  The patient is status post IV iron with Feraheme in March.  INTERIM HISTORY:  Ms. Samudio comes in for followup.  She is feeling better.  She still does have some fatigue.  She says she does get short- winded with exertion.  She thinks that she just needs to exercise more.  When we last checked her iron back in March, her ferritin was up to 196. When we initially saw her, her ferritin was only 2 with an iron saturation of 4%.  In March, her iron saturation was 27%.  As I said before, she is feeling better.  She is not having any issues with respect to bleeding.  She does have the issues with migraine. These seem to be improving, however.  She does have some myalgias which she associates with the migraines.  She has had no rashes.  There has been no leg swelling.  She has had no cough.  PHYSICAL EXAMINATION:  This is a well-developed, well-nourished white female in no obvious distress.  Vital Signs:  temperature 97.1, pulse 77, respiratory rate 18, blood pressure 102/66.  Weight is 141.  Head and neck:  A normocephalic, atraumatic skull.  There are no ocular or oral lesions.  There are no palpable cervical or supraclavicular lymph nodes.  Lungs:  Clear bilaterally.  Cardiac:  Regular rate and rhythm with a normal S1 and S2.  There are no murmurs, rubs or bruits. Abdomen:  Soft with good bowel sounds.  There is no palpable abdominal mass.  There is no palpable hepatosplenomegaly.  Back:  No tenderness over the spine, ribs, or hips.  Extremities:  No clubbing, cyanosis or edema.  Skin:  No rashes, ecchymosis or petechia.  LABORATORY STUDIES:  White cell count is 4.8, hemoglobin 13.2, hematocrit 39.1, platelet count 177.  MCV is 84.  IMPRESSION:  Ms. Fake is a nice 46 year old white female with a history of iron-deficiency anemia.  We corrected her  iron quite nicely with Feraheme.  I could not imagine her iron being low right now.  At this point in time, I really do not think we need to get Ms. Nunnelley back unless she feels she needs to have her blood checked.  I just do not see that we would be adding much to her medical care.  It certainly has been pleasure seeing Ms. Vonstein.  We will be more than happy to see her in the future if necessary.    ______________________________ Josph Macho, M.D. PRE/MEDQ  D:  01/17/2012  T:  01/17/2012  Job:  [id number]

## 2012-01-18 LAB — IRON AND TIBC
%SAT: 11 % — ABNORMAL LOW (ref 20–55)
TIBC: 362 ug/dL (ref 250–470)
UIBC: 323 ug/dL (ref 125–400)

## 2012-01-18 LAB — RETICULOCYTES (CHCC): RBC.: 4.68 MIL/uL (ref 3.87–5.11)

## 2012-01-18 LAB — FERRITIN: Ferritin: 9 ng/mL — ABNORMAL LOW (ref 10–291)

## 2012-01-21 ENCOUNTER — Telehealth: Payer: Self-pay | Admitting: Hematology & Oncology

## 2012-01-21 ENCOUNTER — Other Ambulatory Visit: Payer: Self-pay | Admitting: Hematology & Oncology

## 2012-01-21 DIAGNOSIS — D509 Iron deficiency anemia, unspecified: Secondary | ICD-10-CM

## 2012-01-21 NOTE — Telephone Encounter (Signed)
Patient made 6-7 iron appointment

## 2012-01-24 ENCOUNTER — Ambulatory Visit (HOSPITAL_BASED_OUTPATIENT_CLINIC_OR_DEPARTMENT_OTHER): Payer: 59

## 2012-01-24 VITALS — BP 96/64 | HR 77 | Temp 98.0°F

## 2012-01-24 DIAGNOSIS — D509 Iron deficiency anemia, unspecified: Secondary | ICD-10-CM

## 2012-01-24 MED ORDER — HEPARIN SOD (PORK) LOCK FLUSH 100 UNIT/ML IV SOLN
250.0000 [IU] | Freq: Once | INTRAVENOUS | Status: DC | PRN
  Filled 2012-01-24: qty 5

## 2012-01-24 MED ORDER — FERUMOXYTOL INJECTION 510 MG/17 ML
1020.0000 mg | Freq: Once | INTRAVENOUS | Status: AC
  Administered 2012-01-24: 1020 mg via INTRAVENOUS
  Filled 2012-01-24: qty 34

## 2012-01-24 MED ORDER — SODIUM CHLORIDE 0.9 % IJ SOLN
3.0000 mL | Freq: Once | INTRAMUSCULAR | Status: DC | PRN
  Filled 2012-01-24: qty 10

## 2012-01-24 MED ORDER — HEPARIN SOD (PORK) LOCK FLUSH 100 UNIT/ML IV SOLN
500.0000 [IU] | Freq: Once | INTRAVENOUS | Status: DC | PRN
  Filled 2012-01-24: qty 5

## 2012-01-24 MED ORDER — ALTEPLASE 2 MG IJ SOLR
2.0000 mg | Freq: Once | INTRAMUSCULAR | Status: DC | PRN
  Filled 2012-01-24: qty 2

## 2012-01-24 MED ORDER — SODIUM CHLORIDE 0.9 % IJ SOLN
10.0000 mL | INTRAMUSCULAR | Status: DC | PRN
  Filled 2012-01-24: qty 10

## 2012-01-24 MED ORDER — SODIUM CHLORIDE 0.9 % IV SOLN
Freq: Once | INTRAVENOUS | Status: AC
  Administered 2012-01-24: 15:00:00 via INTRAVENOUS

## 2012-01-24 NOTE — Patient Instructions (Signed)
Ferumoxytol injection What is this medicine? FERUMOXYTOL is an iron complex. Iron is used to make healthy red blood cells, which carry oxygen and nutrients throughout the body. This medicine is used to treat iron deficiency anemia in people with chronic kidney disease. This medicine may be used for other purposes; ask your health care provider or pharmacist if you have questions. What should I tell my health care provider before I take this medicine? They need to know if you have any of these conditions: -anemia not caused by low iron levels -high levels of iron in the blood -magnetic resonance imaging (MRI) test scheduled -an unusual or allergic reaction to iron, other medicines, foods, dyes, or preservatives -pregnant or trying to get pregnant -breast-feeding How should I use this medicine? This medicine is for infusion into a vein. It is given by a health care professional in a hospital or clinic setting. Talk to your pediatrician regarding the use of this medicine in children. Special care may be needed. Overdosage: If you think you've taken too much of this medicine contact a poison control center or emergency room at once. Overdosage: If you think you have taken too much of this medicine contact a poison control center or emergency room at once. NOTE: This medicine is only for you. Do not share this medicine with others. What if I miss a dose? It is important not to miss your dose. Call your doctor or health care professional if you are unable to keep an appointment. What may interact with this medicine? This medicine may interact with the following medications: -other iron products This list may not describe all possible interactions. Give your health care provider a list of all the medicines, herbs, non-prescription drugs, or dietary supplements you use. Also tell them if you smoke, drink alcohol, or use illegal drugs. Some items may interact with your medicine. What should I watch  for while using this medicine? Visit your doctor or healthcare professional regularly. Tell your doctor or healthcare professional if your symptoms do not start to get better or if they get worse. You may need blood work done while you are taking this medicine. You may need to follow a special diet. Talk to your doctor. Foods that contain iron include: whole grains/cereals, dried fruits, beans, or peas, leafy green vegetables, and organ meats (liver, kidney). What side effects may I notice from receiving this medicine? Side effects that you should report to your doctor or health care professional as soon as possible: -allergic reactions like skin rash, itching or hives, swelling of the face, lips, or tongue -breathing problems -changes in blood pressure -feeling faint or lightheaded, falls -fever or chills -flushing, sweating, or hot feelings -swelling of the ankles or feet Side effects that usually do not require medical attention (Report these to your doctor or health care professional if they continue or are bothersome.): -diarrhea -headache -nausea, vomiting -stomach pain This list may not describe all possible side effects. Call your doctor for medical advice about side effects. You may report side effects to FDA at 1-800-FDA-1088. Where should I keep my medicine? This drug is given in a hospital or clinic and will not be stored at home. NOTE: This sheet is a summary. It may not cover all possible information. If you have questions about this medicine, talk to your doctor, pharmacist, or health care provider.  2012, [employer]vier/Gold Standard. (04/27/2008 9:48:25 PM) 

## 2012-03-03 ENCOUNTER — Other Ambulatory Visit (HOSPITAL_BASED_OUTPATIENT_CLINIC_OR_DEPARTMENT_OTHER): Payer: 59 | Admitting: Lab

## 2012-03-06 ENCOUNTER — Other Ambulatory Visit: Payer: 59 | Admitting: Lab

## 2012-03-06 DIAGNOSIS — D509 Iron deficiency anemia, unspecified: Secondary | ICD-10-CM

## 2012-03-06 LAB — CBC WITH DIFFERENTIAL (CANCER CENTER ONLY)
BASO#: 0 10*3/uL (ref 0.0–0.2)
EOS%: 1.7 % (ref 0.0–7.0)
HCT: 41 % (ref 34.8–46.6)
HGB: 14 g/dL (ref 11.6–15.9)
MCH: 29.5 pg (ref 26.0–34.0)
MCHC: 34.1 g/dL (ref 32.0–36.0)
MONO%: 9.8 % (ref 0.0–13.0)
NEUT%: 57.8 % (ref 39.6–80.0)
RDW: 15.1 % (ref 11.1–15.7)

## 2012-03-10 ENCOUNTER — Telehealth: Payer: Self-pay | Admitting: *Deleted

## 2012-03-10 NOTE — Telephone Encounter (Addendum)
Message copied by Mirian Capuchin on Tue Mar 10, 2012 10:25 AM ------      Message from: Arlan Organ R      Created: Mon Mar 09, 2012  6:33 PM       Please call and tell her that her iron is much better. Thank you. Cindee Lame This message left on pt's home phone answering machine.

## 2012-04-16 ENCOUNTER — Emergency Department (HOSPITAL_BASED_OUTPATIENT_CLINIC_OR_DEPARTMENT_OTHER): Payer: 59

## 2012-04-16 ENCOUNTER — Encounter (HOSPITAL_BASED_OUTPATIENT_CLINIC_OR_DEPARTMENT_OTHER): Payer: Self-pay | Admitting: *Deleted

## 2012-04-16 ENCOUNTER — Telehealth: Payer: Self-pay | Admitting: Family Medicine

## 2012-04-16 ENCOUNTER — Emergency Department (HOSPITAL_BASED_OUTPATIENT_CLINIC_OR_DEPARTMENT_OTHER)
Admission: EM | Admit: 2012-04-16 | Discharge: 2012-04-16 | Disposition: A | Discharge status: Home / Self Care | Payer: 59 | Attending: Emergency Medicine | Admitting: Emergency Medicine

## 2012-04-16 DIAGNOSIS — G43809 Other migraine, not intractable, without status migrainosus: Secondary | ICD-10-CM | POA: Insufficient documentation

## 2012-04-16 DIAGNOSIS — Z9109 Other allergy status, other than to drugs and biological substances: Secondary | ICD-10-CM | POA: Insufficient documentation

## 2012-04-16 DIAGNOSIS — K589 Irritable bowel syndrome without diarrhea: Secondary | ICD-10-CM | POA: Insufficient documentation

## 2012-04-16 DIAGNOSIS — Z7982 Long term (current) use of aspirin: Secondary | ICD-10-CM | POA: Insufficient documentation

## 2012-04-16 LAB — COMPREHENSIVE METABOLIC PANEL
ALT: 8 U/L (ref 0–35)
CO2: 27 mEq/L (ref 19–32)
Calcium: 9.6 mg/dL (ref 8.4–10.5)
Creatinine, Ser: 0.5 mg/dL (ref 0.50–1.10)
GFR calc Af Amer: >90 mL/min (ref >90)
GFR calc non Af Amer: >90 mL/min (ref >90)
Glucose, Bld: 94 mg/dL (ref 70–99)
Sodium: 139 mEq/L (ref 135–145)

## 2012-04-16 LAB — CBC WITH DIFFERENTIAL/PLATELET
Eosinophils Relative: 2 % (ref 0–5)
HCT: 37.9 % (ref 36.0–46.0)
Hemoglobin: 13.2 g/dL (ref 12.0–15.0)
Lymphocytes Relative: 34 % (ref 12–46)
Lymphs Abs: 1.6 10*3/uL (ref 0.7–4.0)
MCV: 85.9 fL (ref 78.0–100.0)
Monocytes Absolute: 0.6 10*3/uL (ref 0.1–1.0)
Monocytes Relative: 12 % (ref 3–12)
RBC: 4.41 MIL/uL (ref 3.87–5.11)
WBC: 4.7 10*3/uL (ref 4.0–10.5)

## 2012-04-16 NOTE — Telephone Encounter (Signed)
Agree w/ immediate evaluation

## 2012-04-16 NOTE — Telephone Encounter (Signed)
Noted pt is currently in [facility] ED, MD Beverely Low made aware verbally

## 2012-04-16 NOTE — Telephone Encounter (Signed)
Caller: Pamela Ray/Patient; Patient Name: Pamela Ray; PCP: Sheliah Hatch.; Best Callback Phone Number: [phone]. Pt. reporting 2 episodes of nearly blacking out, with numbness on R face, and Right extremities.  Last episode 30 minutes ago.  Pt. seen by Neurology for migraines, and has sensation that a migraine is approaching.  She has never experienced these neuro deficits before.  Pt.s speech clear and appropriate. Pt. called her Neurologist, who said they could not see her until next week.   Triaged with Neurological Deficits and the Disposition for:  "Had recent episodes of paralysis, weakness, numbness/tingling of an arm or leg or the face, expecially on the same side of body, and NOW RESOLVED:  See ED Immediately.  Pt.s husband is available to drive to the ED now.  Care instructions given.  CAN/db

## 2012-04-16 NOTE — ED Notes (Signed)
Passed out this am. Same thing happened last week. States she had numbness on the right side of her body afterwards with both episodes. Ambulatory at triage. Alert oriented. Family is with her.

## 2012-04-16 NOTE — ED Provider Notes (Signed)
History     CSN: [id number]  Arrival date & time 04/16/12  1348   First MD Initiated Contact with Patient 04/16/12 1405      Chief Complaint  Patient presents with  . Loss of Consciousness    (Consider location/radiation/quality/duration/timing/severity/associated sxs/prior treatment) HPI Comments: Patient with history of migraines.  Today was sitting at her desk at work and had an apparent syncopal episode.  She woke with numbness to the right side of her body which lasted about 5 minutes then resolved.  She feels fine now.  She has a history of migraines and is receiving botox injections for this.  No injury or trauma.  No fevers or chills.    Patient is a 46 y.o. female presenting with weakness.  Weakness The primary symptoms include headaches and loss of consciousness. Episode onset: 11AM today. The episode lasted 1 minute. The symptoms are resolved. The neurological symptoms are focal.  The headache is associated with weakness.  Additional symptoms include weakness.    Past Medical History  Diagnosis Date  . Migraine   . Mitral valve prolapse   . IBS (irritable bowel syndrome)   . Kidney stone 12/12  . Chicken pox   . Seasonal allergies   . Anemia, iron deficiency 10/11/2011  . Migraine 10/11/2011  . Raynaud's phenomenon (by history or observed) 10/11/2011    Past Surgical History  Procedure Date  . Tonsillectomy   . Hernia repair     double 1999    Family History  Problem Relation Age of Onset  . Rheum arthritis Mother   . Rheum arthritis Maternal Grandmother   . Rheum arthritis Paternal Grandmother   . Heart disease      pgm,pgf,mgm,mgf  . Stroke Maternal Grandmother   . Stroke Maternal Grandfather   . Stroke Maternal Grandfather   . Stroke Paternal Grandmother   . Stroke Paternal Grandfather   . Hypertension Maternal Grandmother   . Hypertension Maternal Grandfather   . Hypertension Paternal Grandmother   . Hypertension Paternal Grandfather   .  Diabetes Maternal Grandmother   . Diabetes Maternal Grandfather   . Diabetes Paternal Grandmother   . Diabetes Paternal Grandfather     History  Substance Use Topics  . Smoking status: Never Smoker   . Smokeless tobacco: Not on file  . Alcohol Use: No    OB History    Grav Para Term Preterm Abortions TAB SAB Ect Mult Living                  Review of Systems  Neurological: Positive for loss of consciousness, weakness and headaches.  All other systems reviewed and are negative.    Allergies  Cephalexin and Penicillins  Home Medications   Current Outpatient Rx  Name Route Sig Dispense Refill  . ASPIRIN 81 MG PO TABS Oral Take 81 mg by mouth daily.    Marland Kitchen BACLOFEN 20 MG PO TABS Oral Take 10 mg by mouth as needed.    Marland Kitchen METAXALONE 800 MG PO TABS Oral Take 800 mg by mouth 2 (two) times daily as needed. For migraines    . PRESCRIPTION MEDICATION Topical Apply 1 application topically 2 (two) times daily as needed. Eczema cream    . SUMATRIPTAN SUCCINATE 100 MG PO TABS Oral Take 100 mg by mouth every 2 (two) hours as needed. Take 1 tablet at onset of migraine  may repeat in 2 hours  Limit use to 2 days a week     .  SUMATRIPTAN SUCCINATE 6 MG/0.5ML [place] DEVI Subcutaneous Inject 6 mg into the skin every 2 (two) hours as needed. Use 1 injection subcutaneously at onset of migraine may repeat in 2 hours as directed     . ZONISAMIDE 100 MG PO CAPS Oral Take 400 mg by mouth at bedtime.        BP 107/79  Pulse 74  Temp 98 F (36.7 C) (Oral)  Resp 18  SpO2 100%  Physical Exam  Nursing note and vitals reviewed. Constitutional: She is oriented to person, place, and time. She appears well-developed and well-nourished. No distress.  HENT:  Head: Normocephalic and atraumatic.  Eyes: EOM are normal. Pupils are equal, round, and reactive to light.  Neck: Normal range of motion. Neck supple.  Cardiovascular: Normal rate and regular rhythm.  Exam reveals no gallop and no friction rub.   No  murmur heard. Pulmonary/Chest: Effort normal and breath sounds normal. No respiratory distress. She has no wheezes.  Abdominal: Soft. Bowel sounds are normal. She exhibits no distension. There is no tenderness.  Musculoskeletal: Normal range of motion.  Neurological: She is alert and oriented to person, place, and time. No cranial nerve deficit. She exhibits normal muscle tone. Coordination normal.  Skin: Skin is warm and dry. She is not diaphoretic.    ED Course  Procedures (including critical care time)   Labs Reviewed  CBC WITH DIFFERENTIAL  COMPREHENSIVE METABOLIC PANEL   No results found.   No diagnosis found.    MDM  The patient presents after an apparent unresponsive episode followed by numbness to the right side of her body.  This lasted five minutes, then resolved.  She feels fine now and is neurologically intact.  The workup reveals a normal head ct, labs.  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  I have recommended that she follow up with her pcp/neurologist in the upcoming days and return prn.        Geoffery Lyons, MD 04/16/12 1530

## 2012-05-14 ENCOUNTER — Ambulatory Visit (INDEPENDENT_AMBULATORY_CARE_PROVIDER_SITE_OTHER): Payer: 59 | Admitting: Family Medicine

## 2012-05-14 ENCOUNTER — Encounter: Payer: Self-pay | Admitting: Family Medicine

## 2012-05-14 VITALS — BP 104/74 | HR 73 | Temp 98.2°F | Ht 66.0 in | Wt 138.2 lb

## 2012-05-14 DIAGNOSIS — D509 Iron deficiency anemia, unspecified: Secondary | ICD-10-CM

## 2012-05-14 DIAGNOSIS — R002 Palpitations: Secondary | ICD-10-CM

## 2012-05-14 DIAGNOSIS — G43809 Other migraine, not intractable, without status migrainosus: Secondary | ICD-10-CM

## 2012-05-14 DIAGNOSIS — Z23 Encounter for immunization: Secondary | ICD-10-CM

## 2012-05-14 DIAGNOSIS — R55 Syncope and collapse: Secondary | ICD-10-CM

## 2012-05-14 LAB — CBC WITH DIFFERENTIAL/PLATELET
Basophils Absolute: 0 10*3/uL (ref 0.0–0.1)
Hemoglobin: 12.5 g/dL (ref 12.0–15.0)
Lymphocytes Relative: 33.5 % (ref 12.0–46.0)
Monocytes Relative: 9.7 % (ref 3.0–12.0)
Neutro Abs: 2.3 10*3/uL (ref 1.4–7.7)
Platelets: 201 10*3/uL (ref 150.0–400.0)
RDW: 12.9 % (ref 11.5–14.6)

## 2012-05-14 LAB — IBC PANEL
Saturation Ratios: 14.1 % — ABNORMAL LOW (ref 20.0–50.0)
Transferrin: 264 mg/dL (ref 212.0–360.0)

## 2012-05-14 LAB — TSH: TSH: 2 u[IU]/mL (ref 0.35–5.50)

## 2012-05-14 NOTE — Assessment & Plan Note (Signed)
New.  Pt w/ hx of MVP.  Reports palpitations have increased in severity and frequency recently.  Short PR on EKG.  Will refer to cards.  Pt expressed understanding and is in agreement w/ plan.

## 2012-05-14 NOTE — Assessment & Plan Note (Signed)
Chronic problem, following w/ Dr Myna Hidalgo but would like labs done so that she doesn't need to make lab visit next month.  Labs ordered.

## 2012-05-14 NOTE — Progress Notes (Signed)
  Subjective:    Patient ID: Pamela Ray, female    DOB: [birth year], 46 y.o.   MRN: [id number]  HPI ER f/u- went to hospital on 8/29 after passing out at work.  Had passed out the previous week 'w/ the exact same feeling'.  Had associated numbness on entire R side of body.  Initially had sensation that she 'just fell asleep'.  Developed migraine after 2nd incident.  ER felt sxs were caused by migraine variant.  Pt w/ hx of MVP and reports sxs are 'much more pronounced'.  Reports she is having palpitations and at times will have SOB.  'it feels like my heart is hiccuping'.  Has called neuro- they told her to f/u here.  Has not had passing out or numbness since 8/29, but has had migraine.  Pt did not pass out w/ position change- was seated both times.  Hx of iron deficiency anemia- following w/ Dr Myna Hidalgo, would like labs rechecked.   Review of Systems For ROS see HPI     Objective:   Physical Exam  Vitals reviewed. Constitutional: She is oriented to person, place, and time. She appears well-developed and well-nourished. No distress.  HENT:  Head: Normocephalic and atraumatic.       TMs WNL No TTP over sinuses Minimal nasal congestion  Eyes: Conjunctivae normal and EOM are normal. Pupils are equal, round, and reactive to light.  Neck: Normal range of motion. Neck supple.  Cardiovascular: Normal rate, regular rhythm, normal heart sounds and intact distal pulses.   No murmur heard. Pulmonary/Chest: Effort normal and breath sounds normal. No respiratory distress. She has no wheezes. She has no rales.  Lymphadenopathy:    She has no cervical adenopathy.  Neurological: She is alert and oriented to person, place, and time. She has normal reflexes. No cranial nerve deficit. Coordination normal.  Psychiatric: She has a normal mood and affect. Her behavior is normal. Judgment and thought content normal.          Assessment & Plan:

## 2012-05-14 NOTE — Assessment & Plan Note (Signed)
New.  Pt's recent episodes may be consistent w/ hemiplegic migraine or variant of aura.  Today is asymptomatic.  Has f/u scheduled w/ Dr Neale Burly.  Will follow.

## 2012-05-14 NOTE — Patient Instructions (Addendum)
I think your passing out and numbness is likely a hemiplegic migraine or migraine variant Please discuss this w/ Dr Bess Harvest call you with your Cardiology appt to assess the palpitations Call with any questions or concerns Hang in there!

## 2012-05-14 NOTE — Assessment & Plan Note (Signed)
New.  Suspect this is part of migraine variant.  No arrhythmia on EKG.  Check TSH, CBC.  Will refer to cards for complete eval.

## 2012-05-15 ENCOUNTER — Encounter: Payer: Self-pay | Admitting: *Deleted

## 2012-05-20 ENCOUNTER — Encounter: Payer: 59 | Admitting: Cardiology

## 2012-05-20 NOTE — Progress Notes (Signed)
HPI: 46 year old female with PMH of MVP for evaluation of palpitations and syncope. Hemoglobin on September 26 was 12.5. Recent TSH normal. Recent potassium, BUN and creatinine normal.  Current Outpatient Prescriptions  Medication Sig Dispense Refill  . aspirin 81 MG tablet Take 81 mg by mouth daily.      . baclofen (LIORESAL) 20 MG tablet Take 10 mg by mouth as needed.      . metaxalone (SKELAXIN) 800 MG tablet Take 800 mg by mouth 2 (two) times daily as needed. For migraines      . PRESCRIPTION MEDICATION Apply 1 application topically 2 (two) times daily as needed. Eczema cream      . SUMAtriptan (IMITREX) 100 MG tablet Take 100 mg by mouth every 2 (two) hours as needed. Take 1 tablet at onset of migraine  may repeat in 2 hours  Limit use to 2 days a week       . SUMAtriptan Succinate (SUMAVEL DOSEPRO) 6 MG/0.5ML DEVI Inject 6 mg into the skin every 2 (two) hours as needed. Use 1 injection subcutaneously at onset of migraine may repeat in 2 hours as directed       . zonisamide (ZONEGRAN) 100 MG capsule Take 400 mg by mouth at bedtime.          Allergies  Allergen Reactions  . Cephalexin Itching  . Penicillins Hives    Past Medical History  Diagnosis Date  . Migraine   . Mitral valve prolapse   . IBS (irritable bowel syndrome)   . Kidney stone 12/12  . Chicken pox   . Seasonal allergies   . Anemia, iron deficiency 10/11/2011  . Migraine 10/11/2011  . Raynaud's phenomenon (by history or observed) 10/11/2011    Past Surgical History  Procedure Date  . Tonsillectomy   . Hernia repair     double 1999    History   Social History  . Marital Status: Married    Spouse Name: N/A    Number of Children: N/A  . Years of Education: N/A   Occupational History  . Not on file.   Social History Main Topics  . Smoking status: Never Smoker   . Smokeless tobacco: Not on file  . Alcohol Use: No  . Drug Use: No  . Sexually Active: Yes   Other Topics Concern  . Not on file    Social History Narrative  . No narrative on file    Family History  Problem Relation Age of Onset  . Rheum arthritis Mother   . Rheum arthritis Maternal Grandmother   . Rheum arthritis Paternal Grandmother   . Heart disease      pgm,pgf,mgm,mgf  . Stroke Maternal Grandmother   . Stroke Maternal Grandfather   . Stroke Maternal Grandfather   . Stroke Paternal Grandmother   . Stroke Paternal Grandfather   . Hypertension Maternal Grandmother   . Hypertension Maternal Grandfather   . Hypertension Paternal Grandmother   . Hypertension Paternal Grandfather   . Diabetes Maternal Grandmother   . Diabetes Maternal Grandfather   . Diabetes Paternal Grandmother   . Diabetes Paternal Grandfather     ROS:no fevers or chills, productive cough, hemoptysis, dysphasia, odynophagia, melena, hematochezia, dysuria, hematuria, rash, seizure activity, orthopnea, PND, pedal edema, claudication. Remaining systems are negative.  Physical Exam:   Last menstrual period 05/11/2012.  General:  Well developed/well nourished in NAD Skin warm/dry Patient not depressed No peripheral clubbing Back-normal HEENT-normal/normal eyelids Neck supple/normal carotid upstroke bilaterally; no bruits; no JVD;  no thyromegaly chest - CTA/ normal expansion CV - RRR/normal S1 and S2; no murmurs, rubs or gallops;  PMI nondisplaced Abdomen -NT/ND, no HSM, no mass, + bowel sounds, no bruit 2+ femoral pulses, no bruits Ext-no edema, chords, 2+ DP Neuro-grossly nonfocal  ECG 05/14/2012-sinus rhythm, short PR interval, RV conduction delay.   This encounter was created in error - please disregard.

## 2012-06-03 ENCOUNTER — Ambulatory Visit (INDEPENDENT_AMBULATORY_CARE_PROVIDER_SITE_OTHER): Payer: 59 | Admitting: Cardiology

## 2012-06-03 ENCOUNTER — Encounter: Payer: Self-pay | Admitting: Cardiology

## 2012-06-03 VITALS — BP 115/70 | HR 74 | Ht 66.0 in | Wt 138.0 lb

## 2012-06-03 DIAGNOSIS — R002 Palpitations: Secondary | ICD-10-CM

## 2012-06-03 DIAGNOSIS — R55 Syncope and collapse: Secondary | ICD-10-CM

## 2012-06-03 NOTE — Patient Instructions (Addendum)
Your physician recommends that you schedule a follow-up appointment in: 6 WEEKS WITH DR CRENSHAW IN [place]  Your physician has requested that you have an echocardiogram. Echocardiography is a painless test that uses sound waves to create images of your heart. It provides your doctor with information about the size and shape of your heart and how well your heart's chambers and valves are working. This procedure takes approximately one hour. There are no restrictions for this procedure.AT THE [facility] OFFICE  Your physician has recommended that you wear an event monitor. Event monitors are medical devices that record the heart's electrical activity. Doctors most often [place] these monitors to diagnose arrhythmias. Arrhythmias are problems with the speed or rhythm of the heartbeat. The monitor is a small, portable device. You can wear one while you do your normal daily activities. This is usually used to diagnose what is causing palpitations/syncope (passing out).AT THE [place] OFFICE

## 2012-06-03 NOTE — Assessment & Plan Note (Signed)
CardioNet an echocardiogram.

## 2012-06-03 NOTE — Progress Notes (Signed)
HPI: 46 year old female with past medical history of mitral valve prolapse for evaluation of palpitations and syncope. Laboratories in September of 2013 showed a normal hemoglobin, normal TSH and potassium was normal in August of 2013. Head CT in August of 2013 unremarkable. Patient has had intermittent palpitations and syncope in the past with previous negative evaluations by her report. She has had 2 recent episodes. She had sudden syncope followed by 5 minutes of none sensation on the right side of her body including her right upper and lower extremity. She did not have incontinence or seizure activity. There was no preceding palpitations, nausea, chest pain or dyspnea. She has had other times when she has had near syncopal episodes. These are not related to position, palpitations or chest pain. She does not have preceding nausea or diaphoresis. She does occasionally have palpitations. Otherwise she has dyspnea with more extreme activities but not routine activities. No orthopnea, PND, pedal edema or exertional chest pain.  Current Outpatient Prescriptions  Medication Sig Dispense Refill  . aspirin 81 MG tablet Take 81 mg by mouth daily.      . baclofen (LIORESAL) 20 MG tablet Take 10 mg by mouth as needed.      Marland Kitchen BOTOX 100 UNITS SOLR Every 3 months      . metaxalone (SKELAXIN) 800 MG tablet Take 800 mg by mouth 2 (two) times daily as needed. For migraines      . PRESCRIPTION MEDICATION Apply 1 application topically 2 (two) times daily as needed. Eczema cream      . SUMAtriptan (IMITREX) 100 MG tablet Take 100 mg by mouth every 2 (two) hours as needed. Take 1 tablet at onset of migraine  may repeat in 2 hours  Limit use to 2 days a week       . SUMAtriptan Succinate (SUMAVEL DOSEPRO) 6 MG/0.5ML DEVI Inject 6 mg into the skin every 2 (two) hours as needed. Use 1 injection subcutaneously at onset of migraine may repeat in 2 hours as directed       . zonisamide (ZONEGRAN) 100 MG capsule Take 400 mg by  mouth at bedtime.          Allergies  Allergen Reactions  . Cephalexin Itching  . Penicillins Hives    Past Medical History  Diagnosis Date  . Migraine   . Mitral valve prolapse   . IBS (irritable bowel syndrome)   . Kidney stone 12/12  . Chicken pox   . Seasonal allergies   . Anemia, iron deficiency 10/11/2011  . Migraine 10/11/2011  . Raynaud's phenomenon (by history or observed) 10/11/2011    Past Surgical History  Procedure Date  . Tonsillectomy   . Hernia repair     double 1999    History   Social History  . Marital Status: Married    Spouse Name: N/A    Number of Children: N/A  . Years of Education: N/A   Occupational History  . Not on file.   Social History Main Topics  . Smoking status: Never Smoker   . Smokeless tobacco: Not on file  . Alcohol Use: No  . Drug Use: No  . Sexually Active: Yes   Other Topics Concern  . Not on file   Social History Narrative  . No narrative on file    Family History  Problem Relation Age of Onset  . Rheum arthritis Mother   . Rheum arthritis Maternal Grandmother   . Rheum arthritis Paternal Grandmother   . Heart  disease      pgm,pgf,mgm,mgf  . Stroke Maternal Grandmother   . Stroke Maternal Grandfather   . Stroke Maternal Grandfather   . Stroke Paternal Grandmother   . Stroke Paternal Grandfather   . Hypertension Maternal Grandmother   . Hypertension Maternal Grandfather   . Hypertension Paternal Grandmother   . Hypertension Paternal Grandfather   . Diabetes Maternal Grandmother   . Diabetes Maternal Grandfather   . Diabetes Paternal Grandmother   . Diabetes Paternal Grandfather     ROS: no fevers or chills, productive cough, hemoptysis, dysphasia, odynophagia, melena, hematochezia, dysuria, hematuria, rash, seizure activity, orthopnea, PND, pedal edema, claudication. Remaining systems are negative.  Physical Exam:   Blood pressure 115/70, pulse 74, height 5\' 6"  (1.676 m), weight 138 lb (62.596 kg),  last menstrual period 05/11/2012.  General:  Well developed/well nourished in NAD Skin warm/dry Patient not depressed No peripheral clubbing Back-normal HEENT-normal/normal eyelids Neck supple/normal carotid upstroke bilaterally; no bruits; no JVD; no thyromegaly chest - CTA/ normal expansion CV - RRR/normal S1 and S2; no murmurs, rubs or gallops;  PMI nondisplaced Abdomen -NT/ND, no HSM, no mass, + bowel sounds, no bruit 2+ femoral pulses, no bruits Ext-no edema, chords, 2+ DP Neuro-grossly nonfocal  ECG 05/14/2012-sinus rhythm with RV conduction delay.

## 2012-06-03 NOTE — Assessment & Plan Note (Signed)
Etiology unclear. Given followup numbness on the right side of her body cardiac etiology seems less likely. Plan echocardiogram to quantify LV function and CardioNet. She is also scheduled to followup with her neurologist.

## 2012-06-11 ENCOUNTER — Other Ambulatory Visit (HOSPITAL_COMMUNITY): Payer: 59

## 2012-06-30 ENCOUNTER — Ambulatory Visit (HOSPITAL_COMMUNITY): Payer: 59 | Attending: Cardiology | Admitting: Radiology

## 2012-06-30 ENCOUNTER — Encounter (INDEPENDENT_AMBULATORY_CARE_PROVIDER_SITE_OTHER): Payer: 59

## 2012-06-30 ENCOUNTER — Encounter: Payer: Self-pay | Admitting: Cardiology

## 2012-06-30 DIAGNOSIS — R002 Palpitations: Secondary | ICD-10-CM | POA: Insufficient documentation

## 2012-06-30 DIAGNOSIS — R55 Syncope and collapse: Secondary | ICD-10-CM

## 2012-06-30 DIAGNOSIS — I059 Rheumatic mitral valve disease, unspecified: Secondary | ICD-10-CM | POA: Insufficient documentation

## 2012-06-30 DIAGNOSIS — G43909 Migraine, unspecified, not intractable, without status migrainosus: Secondary | ICD-10-CM | POA: Insufficient documentation

## 2012-06-30 NOTE — Progress Notes (Signed)
Echocardiogram performed.  

## 2012-07-28 ENCOUNTER — Telehealth: Payer: Self-pay | Admitting: *Deleted

## 2012-07-28 NOTE — Telephone Encounter (Signed)
Left message for pt, monitor reviewed by dr Jens Som shows sinus.

## 2012-07-29 ENCOUNTER — Ambulatory Visit: Payer: 59 | Admitting: Cardiology

## 2012-09-21 ENCOUNTER — Telehealth: Payer: Self-pay | Admitting: Hematology & Oncology

## 2012-09-21 NOTE — Telephone Encounter (Signed)
Pt called made 2-27 MD appointment. Dr. Myna Hidalgo aware. Talked with Andrey Campanile at Dr. Eliane Decree ( Oncologist at [facility] office [phone] and she will fax records to [place]. Pt is also going to [facility] for a special radiation treatment with Dr. Manson Passey.

## 2012-10-08 ENCOUNTER — Telehealth: Payer: Self-pay | Admitting: Family Medicine

## 2012-10-08 ENCOUNTER — Ambulatory Visit (INDEPENDENT_AMBULATORY_CARE_PROVIDER_SITE_OTHER): Payer: 59 | Admitting: Family Medicine

## 2012-10-08 ENCOUNTER — Encounter: Payer: Self-pay | Admitting: Family Medicine

## 2012-10-08 VITALS — BP 90/60 | HR 86 | Temp 98.1°F | Ht 66.0 in | Wt 144.0 lb

## 2012-10-08 DIAGNOSIS — I889 Nonspecific lymphadenitis, unspecified: Secondary | ICD-10-CM

## 2012-10-08 MED ORDER — SULFAMETHOXAZOLE-TRIMETHOPRIM 800-160 MG PO TABS: 1.0000 | ORAL_TABLET | Freq: Two times a day (BID) | ORAL | Status: DC

## 2012-10-08 NOTE — Patient Instructions (Addendum)
Start the Bactrim twice daily- take w/ food Warm compresses to the area If no improvement in 10 days- please call me and we'll get an ultrasound Call with any questions or concerns Hang in there!!!

## 2012-10-08 NOTE — Assessment & Plan Note (Signed)
New.  No obvious cause and pt otherwise feeling well.  Is currently having 6 day migraine.  No recent cat scratch or other illness.  Will start abx due to size and TTP.  If no improvement w/ abx will get [place] and/or bx.  Pt expressed understanding and is in agreement w/ plan.

## 2012-10-08 NOTE — Progress Notes (Signed)
  Subjective:    Patient ID: Pamela Ray, female    DOB: [birth year], 47 y.o.   MRN: [id number]  HPI Painful swollen gland- pt is currently in day 6 of migraine, got Botox yesterday for this.  Mentioned it to neuro and was told 'yeah, your gland is swollen'.  L sided, inferior to ear.  LN swelling will come and go.  + L ear pain but this has been associated w/ migraines in the past.  No fevers, nasal congestion, sore throat, cough.  1st noticed 4-5 days ago.  Has hx of similar LN swelling but 'not this pronounced' and not painful.   Review of Systems For ROS see HPI     Objective:   Physical Exam  Vitals reviewed. Constitutional: She appears well-developed and well-nourished. No distress.  HENT:  Head: Normocephalic and atraumatic.  Nose: Nose normal.  Mouth/Throat: Oropharynx is clear and moist. No oropharyngeal exudate.  TMs normal bilaterally No TTP over sinuses  Neck: Normal range of motion. Neck supple.  Lymphadenopathy:       Head (right side): No submental, no submandibular, no tonsillar, no preauricular, no posterior auricular and no occipital adenopathy present.       Head (left side): Posterior auricular (2 cm, mobile, TTP) adenopathy present. No submental, no submandibular, no tonsillar, no preauricular and no occipital adenopathy present.    She has no cervical adenopathy.       Right: No supraclavicular and no epitrochlear adenopathy present.       Left: No supraclavicular and no epitrochlear adenopathy present.          Assessment & Plan:

## 2012-10-08 NOTE — Telephone Encounter (Signed)
error 

## 2012-10-15 ENCOUNTER — Other Ambulatory Visit (HOSPITAL_BASED_OUTPATIENT_CLINIC_OR_DEPARTMENT_OTHER): Payer: 59 | Admitting: Lab

## 2012-10-15 ENCOUNTER — Ambulatory Visit (HOSPITAL_BASED_OUTPATIENT_CLINIC_OR_DEPARTMENT_OTHER): Payer: 59 | Admitting: Hematology & Oncology

## 2012-10-15 ENCOUNTER — Ambulatory Visit: Payer: 59

## 2012-10-15 VITALS — BP 91/58 | HR 84 | Temp 98.3°F | Resp 16 | Ht 66.0 in | Wt 139.0 lb

## 2012-10-15 DIAGNOSIS — D509 Iron deficiency anemia, unspecified: Secondary | ICD-10-CM

## 2012-10-15 DIAGNOSIS — D0512 Intraductal carcinoma in situ of left breast: Secondary | ICD-10-CM

## 2012-10-15 DIAGNOSIS — M810 Age-related osteoporosis without current pathological fracture: Secondary | ICD-10-CM

## 2012-10-15 DIAGNOSIS — D0590 Unspecified type of carcinoma in situ of unspecified breast: Secondary | ICD-10-CM

## 2012-10-15 LAB — CBC WITH DIFFERENTIAL (CANCER CENTER ONLY)
BASO%: 0.5 % (ref 0.0–2.0)
EOS%: 2.3 % (ref 0.0–7.0)
LYMPH#: 1.1 10*3/uL (ref 0.9–3.3)
MCHC: 31.1 g/dL — ABNORMAL LOW (ref 32.0–36.0)
NEUT#: 2.4 10*3/uL (ref 1.5–6.5)
Platelets: 267 10*3/uL (ref 145–400)
RDW: 18 % — ABNORMAL HIGH (ref 11.1–15.7)

## 2012-10-16 ENCOUNTER — Ambulatory Visit: Payer: 59

## 2012-10-16 ENCOUNTER — Ambulatory Visit (HOSPITAL_BASED_OUTPATIENT_CLINIC_OR_DEPARTMENT_OTHER): Payer: 59

## 2012-10-16 VITALS — BP 115/73 | HR 73 | Temp 97.0°F

## 2012-10-16 DIAGNOSIS — D509 Iron deficiency anemia, unspecified: Secondary | ICD-10-CM

## 2012-10-16 LAB — COMPREHENSIVE METABOLIC PANEL
ALT: 11 U/L (ref 0–35)
AST: 17 U/L (ref 0–37)
Albumin: 4 g/dL (ref 3.5–5.2)
Calcium: 9.1 mg/dL (ref 8.4–10.5)
Chloride: 104 mEq/L (ref 96–112)
Potassium: 4 mEq/L (ref 3.5–5.3)

## 2012-10-16 LAB — FERRITIN: Ferritin: 3 ng/mL — ABNORMAL LOW (ref 10–291)

## 2012-10-16 LAB — IRON AND TIBC
%SAT: 3 % — ABNORMAL LOW (ref 20–55)
TIBC: 493 ug/dL — ABNORMAL HIGH (ref 250–470)

## 2012-10-16 MED ORDER — SODIUM CHLORIDE 0.9 % IV SOLN
Freq: Once | INTRAVENOUS | Status: AC
  Administered 2012-10-16: 11:00:00 via INTRAVENOUS

## 2012-10-16 MED ORDER — SODIUM CHLORIDE 0.9 % IV SOLN
1020.0000 mg | Freq: Once | INTRAVENOUS | Status: AC
  Administered 2012-10-16: 1020 mg via INTRAVENOUS
  Filled 2012-10-16: qty 34

## 2012-10-16 NOTE — Patient Instructions (Signed)
Ferumoxytol injection What is this medicine? FERUMOXYTOL is an iron complex. Iron is used to make healthy red blood cells, which carry oxygen and nutrients throughout the body. This medicine is used to treat iron deficiency anemia in people with chronic kidney disease. This medicine may be used for other purposes; ask your health care provider or pharmacist if you have questions. What should I tell my health care provider before I take this medicine? They need to know if you have any of these conditions: -anemia not caused by low iron levels -high levels of iron in the blood -magnetic resonance imaging (MRI) test scheduled -an unusual or allergic reaction to iron, other medicines, foods, dyes, or preservatives -pregnant or trying to get pregnant -breast-feeding How should I use this medicine? This medicine is for infusion into a vein. It is given by a health care professional in a hospital or clinic setting. Talk to your pediatrician regarding the use of this medicine in children. Special care may be needed. Overdosage: If you think you've taken too much of this medicine contact a poison control center or emergency room at once. Overdosage: If you think you have taken too much of this medicine contact a poison control center or emergency room at once. NOTE: This medicine is only for you. Do not share this medicine with others. What if I miss a dose? It is important not to miss your dose. Call your doctor or health care professional if you are unable to keep an appointment. What may interact with this medicine? This medicine may interact with the following medications: -other iron products This list may not describe all possible interactions. Give your health care provider a list of all the medicines, herbs, non-prescription drugs, or dietary supplements you use. Also tell them if you smoke, drink alcohol, or use illegal drugs. Some items may interact with your medicine. What should I watch  for while using this medicine? Visit your doctor or healthcare professional regularly. Tell your doctor or healthcare professional if your symptoms do not start to get better or if they get worse. You may need blood work done while you are taking this medicine. You may need to follow a special diet. Talk to your doctor. Foods that contain iron include: whole grains/cereals, dried fruits, beans, or peas, leafy green vegetables, and organ meats (liver, kidney). What side effects may I notice from receiving this medicine? Side effects that you should report to your doctor or health care professional as soon as possible: -allergic reactions like skin rash, itching or hives, swelling of the face, lips, or tongue -breathing problems -changes in blood pressure -feeling faint or lightheaded, falls -fever or chills -flushing, sweating, or hot feelings -swelling of the ankles or feet Side effects that usually do not require medical attention (Report these to your doctor or health care professional if they continue or are bothersome.): -diarrhea -headache -nausea, vomiting -stomach pain This list may not describe all possible side effects. Call your doctor for medical advice about side effects. You may report side effects to FDA at 1-800-FDA-1088. Where should I keep my medicine? This drug is given in a hospital or clinic and will not be stored at home. NOTE: This sheet is a summary. It may not cover all possible information. If you have questions about this medicine, talk to your doctor, pharmacist, or health care provider.  2012, [employer]vier/Gold Standard. (04/27/2008 9:48:25 PM) 

## 2012-10-17 NOTE — Progress Notes (Signed)
This office note has been dictated.

## 2012-10-19 NOTE — Progress Notes (Signed)
CC:   Neena Rhymes, M.D. Lanice Schwab, MD, Fax [phone]  DIAGNOSES: 1. Ductal carcinoma in situ of the left breast. 2. Recurrent iron deficiency anemia.  CURRENT THERAPY: 1. The patient to start radiation therapy. 2. IV iron as indicated.  INTERIM HISTORY:  Pamela Ray comes in for a visit.  We have been seeing her in the past for iron deficiency anemia.  However, she now has a new problem.  She apparently has been found to have ductal carcinoma in situ of the left breast.  She did have a mammogram.  She, herself, did not notice anything that was unusual.  I think the mammogram was done in [facility].  She underwent a lumpectomy.  This was done on 08/06/2012.  The pathology report ([id number])showed ductal carcinoma in situ.  This was 2.2 mm in size.  Margins were negative.  There was noted to be florid duct hyperplasia.  There were some fibrocystic changes.  She has been seen by Radiation Oncology.  They feel that radiation therapy would be appropriate.  I totally agree with this.  Of note, her DCIS was ER positive and PR positive.  She wanted to come to see [place] to see what else needed to be done.  She feels good.  She really does not feel tired.  She is still premenopausal.  She has not noted any cough or shortness of breath.  There is no bony pain.  There is no leg swelling.  She has not noticed any rashes.  I think that the last time that we gave her iron was back in June of last year.  She does have 2 kids.  She is still premenopausal.  Her age at onset of menstrual cycles was age 47.  PHYSICAL EXAMINATION:  General:  This is a well-developed, well- nourished white female in no obvious distress.  Vital signs:  Show a temperature of 98.3, pulse 84, respiratory rate 16, blood pressure 91/58.  Weight is 139.  Head and neck:  Shows a normocephalic, atraumatic skull.  There are no ocular or oral lesions.  There are no palpable cervical or supraclavicular lymph  nodes.  Lungs:  Clear bilaterally.  Cardiac:  Regular rate and rhythm with a normal S1, S2. There are no murmurs, rubs or bruits.  Breasts:  Shows right breast with no masses, edema or erythema.  There is no right axillary adenopathy. Left breast shows well-healed lumpectomy at the 2 o'clock position.  No distinct masses noted in the left breast.  There is no left axillary adenopathy.  Abdomen:  Soft with good bowel sounds.  There is no palpable abdominal mass.  There is no palpable hepatosplenomegaly. Extremities:  Show no clubbing, cyanosis or edema.  Neurological:  Shows no focal neurological deficits.  Skin:  No rash, ecchymosis or petechia.  LABORATORY STUDIES:  White cell count is 4, hemoglobin 9.3, hematocrit 30, platelet count 267.  MCV is 67.  IMPRESSION:  Pamela Ray is a very nice 47 year old white female.  We have known her for a year or so.  She initially saw [place] for iron deficiency anemia.  She still has recurrent iron deficiency anemia.  We will go ahead and get her set up with IV iron on 02/28.  I do think that she would benefit from systemic therapy for the DCIS. This will be to try to help prevent recurrence.  As she is premenopausal, tamoxifen would be appropriate for her.  I do not think we need to start tamoxifen until  after the radiation is finished.  I think she is going out to [facility] for radiation.  They can have the special radiation done after that to minimize pulmonary cardiac side affects.  We will go ahead and get her back to see [place] in about 6 weeks or so.  By then the radiation should be done or getting close to being done.  We can then address the tamoxifen issue.  Given that she has DCIS, I do not see that she needs to have a BRCA analysis done.  There is no real family history of breast cancer.  There is a paternal grandmother with cervical cancer.  It was nice to see Pamela Ray.  This time, she brought her husband.  He is a Investment banker, operational who  travels and tries to promote good eating.  I spent about 45 minutes or so with Pamela Ray and her husband today. Again, it is always good to see her.    ______________________________ Josph Macho, M.D. PRE/MEDQ  D:  10/17/2012  T:  10/17/2012  Job:  [id number]

## 2012-11-27 ENCOUNTER — Ambulatory Visit (HOSPITAL_BASED_OUTPATIENT_CLINIC_OR_DEPARTMENT_OTHER): Payer: 59 | Admitting: Hematology & Oncology

## 2012-11-27 ENCOUNTER — Other Ambulatory Visit (HOSPITAL_BASED_OUTPATIENT_CLINIC_OR_DEPARTMENT_OTHER): Payer: 59 | Admitting: Lab

## 2012-11-27 VITALS — BP 87/63 | HR 84 | Temp 98.5°F | Resp 16 | Ht 66.0 in | Wt 139.0 lb

## 2012-11-27 DIAGNOSIS — D0590 Unspecified type of carcinoma in situ of unspecified breast: Secondary | ICD-10-CM

## 2012-11-27 DIAGNOSIS — D509 Iron deficiency anemia, unspecified: Secondary | ICD-10-CM

## 2012-11-27 DIAGNOSIS — D0511 Intraductal carcinoma in situ of right breast: Secondary | ICD-10-CM

## 2012-11-27 LAB — IRON AND TIBC
%SAT: 30 % (ref 20–55)
Iron: 99 ug/dL (ref 42–145)
UIBC: 234 ug/dL (ref 125–400)

## 2012-11-27 LAB — CBC WITH DIFFERENTIAL (CANCER CENTER ONLY)
BASO%: 0.5 % (ref 0.0–2.0)
HCT: 40 % (ref 34.8–46.6)
LYMPH%: 26.1 % (ref 14.0–48.0)
MCHC: 33.5 g/dL (ref 32.0–36.0)
MCV: 79 fL — ABNORMAL LOW (ref 81–101)
MONO#: 0.5 10*3/uL (ref 0.1–0.9)
MONO%: 11.2 % (ref 0.0–13.0)
NEUT#: 2.4 10*3/uL (ref 1.5–6.5)
Platelets: 170 10*3/uL (ref 145–400)

## 2012-11-27 LAB — CHCC SATELLITE - SMEAR

## 2012-11-27 LAB — FERRITIN: Ferritin: 67 ng/mL (ref 10–291)

## 2012-11-27 NOTE — Progress Notes (Signed)
This office note has been dictated.

## 2012-11-30 NOTE — Progress Notes (Signed)
CC:   Neena Rhymes, M.D. Lanice Schwab, MD, Fax [phone] [facility]iation [facility]cology, Fax [phone]  DIAGNOSES: 1. Ductal carcinoma in situ (DCIS) of the left breast. 2. History of recurrent iron-deficiency anemia.  CURRENT THERAPY: 1. The patient has completed radiation therapy to the left breast. 2. IV iron as indicated. 3. The patient to start Fareston 60 mg p.o. daily in 2 weeks.  INTERIM HISTORY:  Ms. Mandala comes in for followup.  She is doing well. She completed her radiation therapy, I think, a week or so ago.  She had this at [facility].  She feels well.  There is no fatigue.  She has had no bony pain.  She has had no abdominal pain.  She has had no cough or shortness of breath.  We have been giving her iron in the past.  She has always done well with iron infusions.  The last time we gave her iron was back on 10/16/2012. She got 1020 mg of Feraheme.  PHYSICAL EXAMINATION:  General:  This is a well-developed, well- nourished white female in no obvious distress.  Vital signs: Temperature of 98.5, pulse 84, respiratory rate 16, blood pressure 87/63.  Weight is 139.  Head and neck:  Normocephalic, atraumatic skull. There are no ocular or oral lesions.  There are no palpable cervical or supraclavicular lymph nodes.  Lungs:  Clear bilaterally.  Cardiac: Regular rate and rhythm, with a normal S1 and S2.  There are no murmurs, rubs, or bruits.  Abdomen:  Soft with good bowel sounds.  There is no palpable abdominal mass.  There is no fluid wave.  There is no palpable hepatosplenomegaly.  Breasts:  Exam shows right breast with no masses, edema, or erythema.  There is no right axillary adenopathy.  Left breast shows the well-healed lumpectomy at the 1 o'clock position.  She has erythema about the lumpectomy site from our radiation boost.  There is some tenderness to the left breast with palpitation.  There is no left axillary adenopathy.  Back:  Shows no tenderness over  the spine, ribs, or hips.  Extremities:  Show no clubbing, cyanosis, or edema. Neurological:  Shows no focal neurological deficits.  LABORATORY STUDIES:  White cell count is 4, hemoglobin is 13.4, hematocrit 40, platelet count 170,000.  Iron saturation 30%.  Her iron is 99.  IMPRESSION:  Ms. Gehlhausen is a very charming 47 year old white female with a recent diagnosis of ductal carcinoma in situ of the left breast.  She underwent lumpectomy.  She had a small tumor.  The margins were negative.  Her tumor was ER positive.  She has completed radiation therapy.  I believe that she would be a good candidate for systemic therapy to decrease relapse in the left breast and also contralateral involvement of the right breast.  I personally would prefer to use Fareston.  She is premenopausal. Malachi Bonds is a "relative" to tamoxifen.  The toxicity profile of Fareston seems to be better from my point of view than tamoxifen.  I do not see the risk of uterine cancer with Fareston.  There is also a lower risk of thromboembolic events.  We also do not need to really be too worried about other medications that could be inducers or inhibitors.  I gave her a 2-week sample of Fareston.  She will start this the first Monday in May.  I want to see her back in about 6 weeks' time.  We will see how she is doing with the Fareston.  We will keep her on Fareston for 5 years.  Whenever we see her, we always have to watch her iron studies as she does become iron deficient.    ______________________________ Josph Macho, M.D. PRE/MEDQ  D:  11/27/2012  T:  11/28/2012  Job:  [id number]

## 2012-12-07 ENCOUNTER — Encounter: Payer: Self-pay | Admitting: Hematology & Oncology

## 2013-01-01 ENCOUNTER — Ambulatory Visit (HOSPITAL_BASED_OUTPATIENT_CLINIC_OR_DEPARTMENT_OTHER): Payer: 59 | Admitting: Medical

## 2013-01-01 ENCOUNTER — Other Ambulatory Visit (HOSPITAL_BASED_OUTPATIENT_CLINIC_OR_DEPARTMENT_OTHER): Payer: 59 | Admitting: Lab

## 2013-01-01 VITALS — BP 100/59 | HR 68 | Temp 97.6°F | Resp 16 | Ht 66.0 in | Wt 138.0 lb

## 2013-01-01 DIAGNOSIS — D0511 Intraductal carcinoma in situ of right breast: Secondary | ICD-10-CM

## 2013-01-01 DIAGNOSIS — Z170 Estrogen receptor positive status [ER+]: Secondary | ICD-10-CM

## 2013-01-01 DIAGNOSIS — D0590 Unspecified type of carcinoma in situ of unspecified breast: Secondary | ICD-10-CM

## 2013-01-01 DIAGNOSIS — D509 Iron deficiency anemia, unspecified: Secondary | ICD-10-CM

## 2013-01-01 DIAGNOSIS — N644 Mastodynia: Secondary | ICD-10-CM

## 2013-01-01 DIAGNOSIS — D0512 Intraductal carcinoma in situ of left breast: Secondary | ICD-10-CM

## 2013-01-01 LAB — CBC WITH DIFFERENTIAL (CANCER CENTER ONLY)
BASO#: 0 10*3/uL (ref 0.0–0.2)
Eosinophils Absolute: 0.1 10*3/uL (ref 0.0–0.5)
HCT: 40.4 % (ref 34.8–46.6)
HGB: 13.7 g/dL (ref 11.6–15.9)
LYMPH%: 27.6 % (ref 14.0–48.0)
MCH: 28.6 pg (ref 26.0–34.0)
MCV: 84 fL (ref 81–101)
MONO#: 0.6 10*3/uL (ref 0.1–0.9)
NEUT%: 59.9 % (ref 39.6–80.0)
Platelets: 192 10*3/uL (ref 145–400)
RBC: 4.79 10*6/uL (ref 3.70–5.32)
WBC: 5.8 10*3/uL (ref 3.9–10.0)

## 2013-01-01 LAB — FERRITIN: Ferritin: 15 ng/mL (ref 10–291)

## 2013-01-01 LAB — IRON AND TIBC
%SAT: 14 % — ABNORMAL LOW (ref 20–55)
Iron: 66 ug/dL (ref 42–145)

## 2013-01-01 MED ORDER — TOREMIFENE CITRATE 60 MG PO TABS: 60.0000 mg | ORAL_TABLET | Freq: Every day | ORAL | Status: DC

## 2013-01-01 NOTE — Progress Notes (Signed)
DIAGNOSES: 1. Ductal carcinoma in situ (DCIS) of the left breast. 2. History of recurrent iron-deficiency anemia.  CURRENT THERAPY: 1. The patient has completed radiation therapy to the left breast. 2. IV iron as indicated. 3. Fareston 60 mg p.o. daily in 2 weeks.  INTERIM HISTORY:  [name] presents today for an office followup visit.  Overall she reports that she's doing relatively well.  She is completed radiation therapy.  She is now on Fareston 60 mg by mouth daily.  She's been on Fareston for about 2 weeks now.  She's not reporting any side effects associated Fareston.  She does state that since radiation she does have intermittent stabbing pain of the left breast.  She reports that she will feels a "shock" like type pain of the left axillary area that shoots down to her left breast.  She does not report any tenderness, erythema, or warmth associated with this.  I advised her that this could be secondary to radiation/scar tissue.  She's not reporting any excessive fatigue or weakness.  In terms of her iron deficiency, her last IV iron was back in February 2014.  She reports she denies any nausea, vomiting, diarrhea, constipation, chest pain, shortness of breath, or cough. She denies any fevers, chills, or night sweats. She denies any abdominal pain or bony-type pain. She denies any obvious or abnormal bleeding. She denies any headaches visual changes or rashes.    Review of Systems: Constitutional:Negative for malaise/fatigue, fever, chills, weight loss, diaphoresis, activity change, appetite change, and unexpected weight change.  HEENT: Negative for double vision, blurred vision, visual loss, ear pain, tinnitus, congestion, rhinorrhea, epistaxis sore throat or sinus disease, oral pain/lesion, tongue soreness Respiratory: Negative for cough, chest tightness, shortness of breath, wheezing and stridor.  Cardiovascular: Negative for chest pain, palpitations, leg swelling, orthopnea, PND, DOE or  claudication Gastrointestinal: Negative for nausea, vomiting, abdominal pain, diarrhea, constipation, blood in stool, melena, hematochezia, abdominal distention, anal bleeding, rectal pain, anorexia and hematemesis.  Genitourinary: Negative for dysuria, frequency, hematuria,  Musculoskeletal: Negative for myalgias, back pain, joint swelling, arthralgias and gait problem.  Skin: Negative for rash, color change, pallor and wound.  Neurological:. Negative for dizziness/light-headedness, tremors, seizures, syncope, facial asymmetry, speech difficulty, weakness, numbness, headaches and paresthesias.  Hematological: Negative for adenopathy. Does not bruise/bleed easily.  Psychiatric/Behavioral:  Negative for depression, no loss of interest in normal activity or change in sleep pattern.   Physical Exam: This is a pleasant 47 year old well-developed well-nourished white female in no obvious distress Vitals: Temperature 97.6 degrees pulse 68 respirations 18 blood pressure 100/59 weight 138 pounds a HEENT reveals a normocephalic, atraumatic skull, no scleral icterus, no oral lesions  Neck is supple without any cervical or supraclavicular adenopathy.  Lungs are clear to auscultation bilaterally. There are no wheezes, rales or rhonci Cardiac is regular rate and rhythm with a normal S1 and S2. There are no murmurs, rubs, or bruits.  Abdomen is soft with good bowel sounds, there is no palpable mass. There is no palpable hepatosplenomegaly. There is no palpable fluid wave.  Musculoskeletal no tenderness of the spine, ribs, or hips.  Extremities there are no clubbing, cyanosis, or edema.  Skin no petechia, purpura or ecchymosis Neurologic is nonfocal. Breast exam: Shows right breast with no masses, edema, or erythema, there is no right axillary adenopathy.  Left breast shows a well healed lumpectomy scar at the 1:00 position.  She is no erythema, tenderness, or warmth.  There is no edema.  There is no  left  axillary adenopathy.  Laboratory Data: White count 5.8 hemoglobin 13.7 hematocrit 40.4 platelets 192,000  Current Outpatient Prescriptions on File Prior to Visit  Medication Sig Dispense Refill  . aspirin 81 MG tablet Take 81 mg by mouth daily.      . baclofen (LIORESAL) 20 MG tablet Take 10 mg by mouth as needed.      Marland Kitchen BOTOX 100 UNITS SOLR Every 3 months      . metaxalone (SKELAXIN) 800 MG tablet Take 800 mg by mouth 2 (two) times daily as needed. For migraines      . PRESCRIPTION MEDICATION Apply 1 application topically 2 (two) times daily as needed. Eczema cream      . SUMAtriptan (IMITREX) 100 MG tablet Take 100 mg by mouth every 2 (two) hours as needed. Take 1 tablet at onset of migraine  may repeat in 2 hours  Limit use to 2 days a week       . SUMAtriptan Succinate (SUMAVEL DOSEPRO) 6 MG/0.5ML DEVI Inject 6 mg into the skin every 2 (two) hours as needed. Use 1 injection subcutaneously at onset of migraine may repeat in 2 hours as directed       . zonisamide (ZONEGRAN) 100 MG capsule Take 400 mg by mouth at bedtime.         No current facility-administered medications on file prior to visit.   Assessment/Plan: This is a pleasant 47 year old female with the following issues:  #1.  Ductal carcinoma in situ of the left breast.  She underwent a lumpectomy.  Her margins were negative.  Her tumor was ER positive.  She completed radiation therapy.  She is now on Fareston.  She's been on Fareston for about 2 weeks.  She states that she has not had any side effects to [employer].  She will continue on Fareston for a total of 5 years.  #2.   Intermittent left breast pain.  Is most likely secondary to radiation/scar tissue.  I do not see any abnormalities.  Her next mammogram is in November.  I advised her if this should continue to give [place] a call back and set her up with an ultrasound.  #3.  Iron deficiency anemia.  Her last dose of IV iron was back on 10/16/2012.  We are rechecking her iron  studies today.  #4.  Followup.  We will follow back up with Ms. Tabares in about 2 months but before then should there be questions or concerns.

## 2013-01-05 ENCOUNTER — Telehealth: Payer: Self-pay | Admitting: Hematology & Oncology

## 2013-01-05 NOTE — Progress Notes (Signed)
Unable to leave message, mailbox full 01-05-13

## 2013-01-05 NOTE — Telephone Encounter (Addendum)
Message copied by Cathi Roan on Tue Jan 05, 2013  3:26 PM ------      Message from: Josph Macho      Created: Sun Jan 03, 2013  8:44 AM       Please call and let her know that her iron is a little bit lower but we're still okay and did not need to give her another dose of iron right now. Thanks. Pete ------  01-05-13  Called patient, no answer. Unable to leave a message d/t mailbox being full.  Lupita Raider LPN

## 2013-01-06 ENCOUNTER — Encounter: Payer: Self-pay | Admitting: *Deleted

## 2013-02-01 ENCOUNTER — Encounter: Payer: Self-pay | Admitting: Hematology & Oncology

## 2013-02-02 ENCOUNTER — Other Ambulatory Visit (HOSPITAL_BASED_OUTPATIENT_CLINIC_OR_DEPARTMENT_OTHER): Payer: 59 | Admitting: Lab

## 2013-02-02 ENCOUNTER — Other Ambulatory Visit: Payer: Self-pay | Admitting: *Deleted

## 2013-02-02 DIAGNOSIS — D509 Iron deficiency anemia, unspecified: Secondary | ICD-10-CM

## 2013-02-02 LAB — CBC WITH DIFFERENTIAL (CANCER CENTER ONLY)
BASO%: 0.2 % (ref 0.0–2.0)
LYMPH%: 29.7 % (ref 14.0–48.0)
MCV: 86 fL (ref 81–101)
MONO#: 0.5 10*3/uL (ref 0.1–0.9)
MONO%: 12.2 % (ref 0.0–13.0)
Platelets: 203 10*3/uL (ref 145–400)
RDW: 13.1 % (ref 11.1–15.7)
WBC: 4.4 10*3/uL (ref 3.9–10.0)

## 2013-02-02 LAB — IRON AND TIBC
%SAT: 7 % — ABNORMAL LOW (ref 20–55)
Iron: 31 ug/dL — ABNORMAL LOW (ref 42–145)

## 2013-02-04 ENCOUNTER — Telehealth: Payer: Self-pay | Admitting: Hematology & Oncology

## 2013-02-04 ENCOUNTER — Encounter: Payer: Self-pay | Admitting: *Deleted

## 2013-02-04 ENCOUNTER — Other Ambulatory Visit: Payer: Self-pay | Admitting: *Deleted

## 2013-02-04 DIAGNOSIS — D509 Iron deficiency anemia, unspecified: Secondary | ICD-10-CM

## 2013-02-04 NOTE — Telephone Encounter (Signed)
Pt called made 6-26 iron appointment, she is aware she may have to leave and come back for MD appointment

## 2013-02-11 ENCOUNTER — Ambulatory Visit (HOSPITAL_BASED_OUTPATIENT_CLINIC_OR_DEPARTMENT_OTHER): Payer: 59 | Admitting: Hematology & Oncology

## 2013-02-11 ENCOUNTER — Other Ambulatory Visit: Payer: 59 | Admitting: Lab

## 2013-02-11 ENCOUNTER — Encounter: Payer: Self-pay | Admitting: Hematology & Oncology

## 2013-02-11 ENCOUNTER — Ambulatory Visit (HOSPITAL_BASED_OUTPATIENT_CLINIC_OR_DEPARTMENT_OTHER): Payer: 59

## 2013-02-11 VITALS — BP 106/65 | HR 75 | Temp 98.5°F | Resp 18

## 2013-02-11 DIAGNOSIS — D0590 Unspecified type of carcinoma in situ of unspecified breast: Secondary | ICD-10-CM

## 2013-02-11 DIAGNOSIS — D509 Iron deficiency anemia, unspecified: Secondary | ICD-10-CM

## 2013-02-11 DIAGNOSIS — D0510 Intraductal carcinoma in situ of unspecified breast: Secondary | ICD-10-CM

## 2013-02-11 HISTORY — DX: Intraductal carcinoma in situ of unspecified breast: D05.10

## 2013-02-11 MED ORDER — SODIUM CHLORIDE 0.9 % IV SOLN
1020.0000 mg | Freq: Once | INTRAVENOUS | Status: AC
  Administered 2013-02-11: 1020 mg via INTRAVENOUS
  Filled 2013-02-11: qty 34

## 2013-02-11 NOTE — Patient Instructions (Signed)
Ferumoxytol injection What is this medicine? FERUMOXYTOL is an iron complex. Iron is used to make healthy red blood cells, which carry oxygen and nutrients throughout the body. This medicine is used to treat iron deficiency anemia in people with chronic kidney disease. This medicine may be used for other purposes; ask your health care provider or pharmacist if you have questions. What should I tell my health care provider before I take this medicine? They need to know if you have any of these conditions: -anemia not caused by low iron levels -high levels of iron in the blood -magnetic resonance imaging (MRI) test scheduled -an unusual or allergic reaction to iron, other medicines, foods, dyes, or preservatives -pregnant or trying to get pregnant -breast-feeding How should I use this medicine? This medicine is for infusion into a vein. It is given by a health care professional in a hospital or clinic setting. Talk to your pediatrician regarding the use of this medicine in children. Special care may be needed. Overdosage: If you think you've taken too much of this medicine contact a poison control center or emergency room at once. Overdosage: If you think you have taken too much of this medicine contact a poison control center or emergency room at once. NOTE: This medicine is only for you. Do not share this medicine with others. What if I miss a dose? It is important not to miss your dose. Call your doctor or health care professional if you are unable to keep an appointment. What may interact with this medicine? This medicine may interact with the following medications: -other iron products This list may not describe all possible interactions. Give your health care provider a list of all the medicines, herbs, non-prescription drugs, or dietary supplements you use. Also tell them if you smoke, drink alcohol, or use illegal drugs. Some items may interact with your medicine. What should I watch  for while using this medicine? Visit your doctor or healthcare professional regularly. Tell your doctor or healthcare professional if your symptoms do not start to get better or if they get worse. You may need blood work done while you are taking this medicine. You may need to follow a special diet. Talk to your doctor. Foods that contain iron include: whole grains/cereals, dried fruits, beans, or peas, leafy green vegetables, and organ meats (liver, kidney). What side effects may I notice from receiving this medicine? Side effects that you should report to your doctor or health care professional as soon as possible: -allergic reactions like skin rash, itching or hives, swelling of the face, lips, or tongue -breathing problems -changes in blood pressure -feeling faint or lightheaded, falls -fever or chills -flushing, sweating, or hot feelings -swelling of the ankles or feet Side effects that usually do not require medical attention (Report these to your doctor or health care professional if they continue or are bothersome.): -diarrhea -headache -nausea, vomiting -stomach pain This list may not describe all possible side effects. Call your doctor for medical advice about side effects. You may report side effects to FDA at 1-800-FDA-1088. Where should I keep my medicine? This drug is given in a hospital or clinic and will not be stored at home. NOTE: This sheet is a summary. It may not cover all possible information. If you have questions about this medicine, talk to your doctor, pharmacist, or health care provider.  2012, [employer]vier/Gold Standard. (04/27/2008 9:48:25 PM) 

## 2013-02-11 NOTE — Progress Notes (Signed)
This office note has been dictated.

## 2013-02-12 NOTE — Progress Notes (Signed)
CC:   Pamela Ray, M.D.  DIAGNOSIS: 1. Ductal carcinoma in situ of the left breast. 2. Recurrent iron deficiency anemia.  CURRENT THERAPY: 1. IV iron as indicated.  The patient to receive a dose of Feraheme     today. 2. Fareston 60 mg p.o. daily.  INTERIM HISTORY:  Pamela Ray comes in for followup.  She is feeling quite tired.  We did do some iron studies recently on her.  She does tend to drop her iron relatively quickly.  No surprise, her ferritin was only 8 with an iron saturation of 7%.  As such, we got her in today for an iron infusion.  She was in [facility] for work recently.  She was very worn out.  This usually is a sign of low iron for her.  She has been on Fareston now for about 2-3 weeks.  So far she has had no problems with it.  She has had some night sweats.  This I told her was not unusual.  She did not complain of any breast pain with the left breast.  She was having some discomfort with her radiation.  She has had no cough.  There has been no leg swelling.  She has had no rashes.  She has had no change in bowel or bladder habits.  She says that her cycles are a little off.  This may be more related to the lack of iron.  She has lost some hair.  She also feels this might be from the iron.  PHYSICAL EXAMINATION:  General:  This is a well-developed, well- nourished white female in no obvious distress.  Vital signs: Temperature of 98.5, pulse 75, respiratory rate 16, blood pressure 105/67.  Weight is 138.  Head and neck:  Normocephalic, atraumatic skull.  There are no ocular or oral lesions.  There are no palpable cervical or supraclavicular lymph nodes.  Lungs:  Clear to percussion and auscultation bilaterally.  Cardiac:  Regular rate and rhythm with a normal S1 and S2.  There are no murmurs, rubs or bruits.  Breasts: Shows right breast with no masses, edema or erythema.  There is no right axillary adenopathy.  Left breast shows well-healed lumpectomy.   This is at the 2 o'clock position.  There is some slight contraction at the lumpectomy site.  No tenderness is noted.  There is no distinct mass in the left breast.  There is no left axillary adenopathy.  Abdominal exam: Soft with good bowel sounds.  There is no palpable abdominal mass. There is no fluid wave.  There is no palpable hepatosplenomegaly.  Back exam:  No tenderness over the spine, ribs, or hips.  Extremities:  Show no clubbing, cyanosis or edema.  Neurological:  Shows no focal neurological deficits.  LABORATORY STUDIES:  White cell count 4.4, hemoglobin 12.5, hematocrit 37.5, platelet count 203.  IMPRESSION:  Pamela Ray is a very charming 47 year old white female with a ductal carcinoma in situ of the left breast.  She had this resected. She underwent radiation therapy.  She is now on Fareston, and doing well.  I will keep her on Fareston for 5 years.  In all honesty, I think that her biggest issue is probably going to be the recurrent iron deficiency anemia.  She still is premenopausal.  As such, I suspect that she likely will continue to have some iron loss through her monthly cycle.  I think we can probably get her back in 3 months' time now.  I  think this would be appropriate for her. I told her that she can always give [place] a call to let us know if she feels tired and if so, we can get iron studies on her.    ______________________________ Josph Macho, M.D. PRE/MEDQ  D:  02/11/2013  T:  02/12/2013  Job:  [id number]

## 2013-02-16 ENCOUNTER — Encounter: Payer: Self-pay | Admitting: Hematology & Oncology

## 2013-02-16 ENCOUNTER — Telehealth: Payer: Self-pay | Admitting: *Deleted

## 2013-02-24 ENCOUNTER — Encounter: Payer: Self-pay | Admitting: Family Medicine

## 2013-02-24 ENCOUNTER — Ambulatory Visit (INDEPENDENT_AMBULATORY_CARE_PROVIDER_SITE_OTHER): Payer: 59 | Admitting: Family Medicine

## 2013-02-24 VITALS — BP 90/70 | HR 81 | Temp 98.1°F | Ht 66.0 in | Wt 141.2 lb

## 2013-02-24 DIAGNOSIS — R0602 Shortness of breath: Secondary | ICD-10-CM | POA: Insufficient documentation

## 2013-02-24 DIAGNOSIS — R5383 Other fatigue: Secondary | ICD-10-CM | POA: Insufficient documentation

## 2013-02-24 DIAGNOSIS — R5381 Other malaise: Secondary | ICD-10-CM | POA: Insufficient documentation

## 2013-02-24 NOTE — Patient Instructions (Addendum)
I suspect this is a viral illness We'll notify you of your lab results Drink plenty of fluids REST! Immodium as needed Call with any questions or concerns Hang in there!

## 2013-02-24 NOTE — Assessment & Plan Note (Signed)
New.  Suspect this is part of a viral syndrome.  EKG w/out obvious cause and lungs CTA.  Will follow.

## 2013-02-24 NOTE — Assessment & Plan Note (Signed)
New.  Suspect viral syndrome- particularly now that pt is having diarrhea.  Will check labs to r/o anemia, electrolyte abnormality, thyroid problem.  Reviewed supportive care and red flags that should prompt return.  Pt expressed understanding and is in agreement w/ plan.

## 2013-02-24 NOTE — Progress Notes (Signed)
  Subjective:    Patient ID: Pamela Ray, female    DOB: May 14, 1966, 47 y.o.   MRN: [id number]  HPI Fatigue- 'i just feel yucky'  sxs started Monday.  Has been having SOB w/ exertion- called Dr Myna Hidalgo and had blood counts checked.  Required iron infusion.  Thought she would feel better after that.  Is making concerted effort to improve stamina- walking 30 min daily.  Monday started having SOB w/ conversation.  'just wiped'.  No fevers- temp was 96 when checked on 2 different thermometers.  No CP but reports chest heaviness.  Has not had chemo for DCIS but did have radiation.  No known sick contacts.  + diarrhea this AM.  + nausea, no vomiting.  No cough.  + migraine this morning.   Review of Systems For ROS see HPI     Objective:   Physical Exam  Vitals reviewed. Constitutional: She is oriented to person, place, and time. She appears well-developed and well-nourished. No distress.  HENT:  Head: Normocephalic and atraumatic.  Nose: Nose normal.  Mouth/Throat: Oropharynx is clear and moist. No oropharyngeal exudate.  TMs normal bilaterally No TTP over sinuses  Neck: Normal range of motion. Neck supple.  Cardiovascular: Normal rate, regular rhythm and normal heart sounds.   Pulmonary/Chest: Effort normal and breath sounds normal. No respiratory distress. She has no wheezes. She has no rales.  Abdominal: Soft. Bowel sounds are normal. She exhibits no distension. There is no tenderness. There is no rebound and no guarding.  Musculoskeletal: She exhibits no edema.  Lymphadenopathy:    She has no cervical adenopathy.  Neurological: She is alert and oriented to person, place, and time.  Skin: Skin is warm and dry.          Assessment & Plan:

## 2013-02-25 ENCOUNTER — Other Ambulatory Visit: Payer: 59

## 2013-02-25 LAB — CBC WITH DIFFERENTIAL/PLATELET
Basophils Absolute: 0 10*3/uL (ref 0.0–0.1)
Eosinophils Absolute: 0.1 10*3/uL (ref 0.0–0.7)
Hemoglobin: 13.6 g/dL (ref 12.0–15.0)
Lymphocytes Relative: 26.3 % (ref 12.0–46.0)
MCHC: 34.1 g/dL (ref 30.0–36.0)
Monocytes Relative: 13.7 % — ABNORMAL HIGH (ref 3.0–12.0)
Neutrophils Relative %: 57.7 % (ref 43.0–77.0)
RBC: 4.65 Mil/uL (ref 3.87–5.11)
RDW: 15.1 % — ABNORMAL HIGH (ref 11.5–14.6)

## 2013-02-25 LAB — BASIC METABOLIC PANEL
CO2: 27 mEq/L (ref 19–32)
Chloride: 109 mEq/L (ref 96–112)
Potassium: 3.9 mEq/L (ref 3.5–5.1)

## 2013-02-25 LAB — TSH: TSH: 2.38 u[IU]/mL (ref 0.35–5.50)

## 2013-05-12 ENCOUNTER — Telehealth: Payer: Self-pay | Admitting: Hematology & Oncology

## 2013-05-12 NOTE — Telephone Encounter (Signed)
Left message moved time of 9-26 appointment

## 2013-05-14 ENCOUNTER — Ambulatory Visit (HOSPITAL_BASED_OUTPATIENT_CLINIC_OR_DEPARTMENT_OTHER): Payer: 59 | Admitting: Hematology & Oncology

## 2013-05-14 ENCOUNTER — Other Ambulatory Visit: Payer: 59 | Admitting: Lab

## 2013-05-14 ENCOUNTER — Other Ambulatory Visit (HOSPITAL_BASED_OUTPATIENT_CLINIC_OR_DEPARTMENT_OTHER): Payer: 59 | Admitting: Lab

## 2013-05-14 ENCOUNTER — Ambulatory Visit: Payer: 59 | Admitting: Hematology & Oncology

## 2013-05-14 VITALS — BP 95/58 | HR 78 | Temp 98.2°F | Resp 14 | Ht 65.0 in | Wt 142.0 lb

## 2013-05-14 DIAGNOSIS — D0590 Unspecified type of carcinoma in situ of unspecified breast: Secondary | ICD-10-CM

## 2013-05-14 DIAGNOSIS — D0511 Intraductal carcinoma in situ of right breast: Secondary | ICD-10-CM

## 2013-05-14 DIAGNOSIS — D0512 Intraductal carcinoma in situ of left breast: Secondary | ICD-10-CM

## 2013-05-14 DIAGNOSIS — D509 Iron deficiency anemia, unspecified: Secondary | ICD-10-CM

## 2013-05-14 LAB — COMPREHENSIVE METABOLIC PANEL
AST: 15 U/L (ref 0–37)
Albumin: 3.8 g/dL (ref 3.5–5.2)
Alkaline Phosphatase: 42 U/L (ref 39–117)
Calcium: 9.4 mg/dL (ref 8.4–10.5)
Chloride: 104 mEq/L (ref 96–112)
Glucose, Bld: 91 mg/dL (ref 70–99)
Potassium: 4 mEq/L (ref 3.5–5.3)
Sodium: 136 mEq/L (ref 135–145)
Total Bilirubin: 0.5 mg/dL (ref 0.3–1.2)
Total Protein: 5.8 g/dL — ABNORMAL LOW (ref 6.0–8.3)

## 2013-05-14 LAB — CBC WITH DIFFERENTIAL (CANCER CENTER ONLY)
BASO#: 0 10*3/uL (ref 0.0–0.2)
BASO%: 0.3 % (ref 0.0–2.0)
Eosinophils Absolute: 0.1 10*3/uL (ref 0.0–0.5)
HCT: 38.3 % (ref 34.8–46.6)
HGB: 13 g/dL (ref 11.6–15.9)
LYMPH#: 1.2 10*3/uL (ref 0.9–3.3)
MCV: 88 fL (ref 81–101)
MONO#: 0.4 10*3/uL (ref 0.1–0.9)
NEUT%: 54.4 % (ref 39.6–80.0)
RBC: 4.34 10*6/uL (ref 3.70–5.32)
WBC: 3.7 10*3/uL — ABNORMAL LOW (ref 3.9–10.0)

## 2013-05-14 LAB — IRON AND TIBC CHCC: %SAT: 30 % (ref 21–57)

## 2013-05-14 NOTE — Progress Notes (Signed)
This office note has been dictated.

## 2013-05-18 NOTE — Progress Notes (Signed)
CC:   Neena Rhymes, M.D.  DIAGNOSES: 1. Ductal carcinoma in situ of the left breast. 2. Recurrent iron deficiency anemia.  CURRENT THERAPY: 1. IV iron as indicated.  The patient last received a dose on     02/11/2013. 2. Fareston 60 mg p.o. daily.  INTERIM HISTORY:  Ms. Clontz comes in for followup.  She is doing okay. She is having a little bit of tenderness with the left breast.  She says that it aches every now and then.  It is nothing that she cannot handle. She just has noticed this a little more frequently.  There has been no swelling of the left breast.  There are no left axillary issues.  The right breast has not given her any issues or problems.  She is still working.  She works for [employer].  She works at home.  She is trying to lose some weight.  She is watching what she eats.  She is trying to excise a little more.  She has had no problems with fever, sweats or chills.  She has had no bony pain.  She has had no arthralgias.  She is on Fareston and doing well with this.  She still has her monthly cycles.  She has had no rashes.  There has been no swallowing issues.  There has been no visual problems.  PHYSICAL EXAMINATION:  General:  This is a well-developed, well- nourished white female in no obvious distress.  Vital signs: Temperature of 98.2, pulse 78, respiratory rate 14, blood pressure 95/58.  Weight is 142 pounds.  Head and Neck:  Show a normocephalic, atraumatic skull.  There are no ocular or oral lesions.  There are no palpable cervical or supraclavicular lymph nodes.  Lungs:  Clear to percussion and auscultation bilaterally.  Cardiac:  Regular rate and rhythm with a normal S1 and S2.  There are no murmurs, rubs or bruits. Abdomen:  Soft.  She has good bowel sounds.  There is no fluid wave. There is no palpable abdominal mass.  There is no palpable hepatosplenomegaly.  Breasts:  Shows right breast with no masses, edema or erythema.  There is  no right axillary adenopathy.  Left breast shows a well-healed lumpectomy scar at the 2 o'clock position.  There is some slight firmness at the lumpectomy site.  There is some slight tenderness in the lower portion of the left breast.  There is no swelling or erythema of the left breast.  There is no mass in the left breast. There is no left axillary adenopathy.  Back exam:  No tenderness over the spine, ribs, or hips.  Extremities:  Show no clubbing, cyanosis or edema.  She has no lymphedema of the left arm.  She has good range motion of her joints.  Skin:  No rashes, ecchymosis, or petechia. Neurological:  No focal neurological deficits.  LABORATORY STUDIES:  White cell count is 3.7, hemoglobin 13, hematocrit 38.3, platelet count 145.  IMPRESSION:  Ms. Almendariz is a very charming 47 year old white female with DCIS of the left breast.  She underwent a lumpectomy.  She had radiation therapy.  I suspect that some of this discomfort that she is having might be related to her radiation and surgery.  I told her that this may be noted for another year or so.  For now, we will continue on the [facility].  I think this would be appropriate for her DCIS.  We will go ahead and plan to get her back in  another 3 months again. We are still checking her iron studies.  Her blood smear looked better.  Her red cells appeared more normochromic and normocytic.    ______________________________ Josph Macho, M.D. PRE/MEDQ  D:  05/14/2013  T:  05/18/2013  Job:  [id number]

## 2013-06-24 ENCOUNTER — Other Ambulatory Visit: Payer: Self-pay

## 2013-07-14 LAB — HM PAP SMEAR: HM Pap smear: NORMAL

## 2013-07-26 NOTE — Progress Notes (Signed)
CC:   Pamela Ray, M.D.  DIAGNOSES: 1. Ductal carcinoma in situ of the left breast. 2. Recurrent iron-deficiency anemia.  CURRENT THERAPY: 1. IV iron as indicated - the patient last received a dose on     02/11/2013. 2. Fareston 60 mg p.o. daily.  INTERIM HISTORY:  Pamela Ray comes in for followup.  She is doing okay. She is having a little bit of tenderness with the left breast.  She says that it aches every now and then.  It is nothing that she cannot handle. She just has noticed this a little more frequently.  There has been no swelling of the left breast.  There are no left axillary issues.  The right breast has not given her any issues or any problems.  She is still working.  She works for [employer].  She works at home.  She is trying to lose some weight.  She is watching what she eats.  She is trying to exercise a little more.  She has had no problems with fever, sweats, or chills.  She has had no bony pain.  She has had no arthralgias.  She is on Fareston and doing well with this.  She still has her monthly cycles.  She has had no rashes.  There has been no swallowing issues.  There have been no visual problems.  PHYSICAL EXAMINATION:  General:  This is a well-developed, well- nourished white female, in no obvious distress.  Vital Signs: Temperature of 98.2, pulse 78, respiratory rate 14, blood pressure 95/58.  Weight is 142 pounds.  Head and Neck:  Normocephalic and atraumatic skull.  There are no ocular or oral lesions.  There are no palpable cervical or supraclavicular lymph nodes.  Lungs:  Clear to percussion and auscultation bilaterally.  Cardiac:  Regular rate and rhythm with a normal S1 and S2.  There are no murmurs, rubs, or bruits. Abdomen:  Soft.  She has good bowel sounds.  There is no fluid wave. There is no palpable abdominal mass.  There is no palpable hepatosplenomegaly.  Breasts:  Right breast with no masses, edema, or erythema.  There is  no right axillary adenopathy.  Left breast shows a well-healed lumpectomy scar at the 2 o'clock position.  There is some slight firmness at the lumpectomy site.  There is some slight tenderness in the lower portion of the left breast.  There is no swelling or erythema of the left breast.  There is no mass in the left breast. There is no left axillary adenopathy.  Back:  No tenderness over the spine, ribs, or hips.  Extremities:  No clubbing, cyanosis, or edema. She has no lymphedema of the left arm.  She has good range motion of her joints.  Skin:  No rashes, ecchymoses, or petechiae.  Neurologic:  No focal neurological deficits.  LABORATORY STUDIES:  White cell count is 3.7, hemoglobin 13, hematocrit 38.3, platelet count 145.  IMPRESSION:  Pamela Ray is a very charming 47 year old white female with ductal carcinoma in situ of the left breast.  She underwent lumpectomy. She had radiation therapy.  I suspect that some of this discomfort that she is having might be related to her radiation and surgery.  I told her this may be noted for another year or so.  For now, we will continue her on the [facility].  I think this would be appropriate for her ductal carcinoma in situ.  We will go ahead and plan to get her back in  another 3 months again.  We are still checking her iron studies.  Her blood smear looked better.  Her red cells appeared more normochromic and normocytic.    ______________________________ Josph Macho, M.D. PRE/MEDQ  D:  05/14/2013  T:  07/25/2013  Job:  [id number]

## 2013-08-06 ENCOUNTER — Other Ambulatory Visit (HOSPITAL_BASED_OUTPATIENT_CLINIC_OR_DEPARTMENT_OTHER): Payer: 59 | Admitting: Lab

## 2013-08-06 ENCOUNTER — Ambulatory Visit (HOSPITAL_BASED_OUTPATIENT_CLINIC_OR_DEPARTMENT_OTHER): Payer: 59 | Admitting: Hematology & Oncology

## 2013-08-06 ENCOUNTER — Ambulatory Visit (HOSPITAL_BASED_OUTPATIENT_CLINIC_OR_DEPARTMENT_OTHER): Payer: 59

## 2013-08-06 VITALS — BP 127/63 | HR 81 | Temp 97.1°F | Resp 16

## 2013-08-06 VITALS — BP 109/62 | HR 75 | Temp 97.3°F | Resp 14 | Ht 65.0 in | Wt 143.0 lb

## 2013-08-06 DIAGNOSIS — D509 Iron deficiency anemia, unspecified: Secondary | ICD-10-CM

## 2013-08-06 DIAGNOSIS — D0512 Intraductal carcinoma in situ of left breast: Secondary | ICD-10-CM

## 2013-08-06 DIAGNOSIS — D0590 Unspecified type of carcinoma in situ of unspecified breast: Secondary | ICD-10-CM

## 2013-08-06 DIAGNOSIS — D0511 Intraductal carcinoma in situ of right breast: Secondary | ICD-10-CM

## 2013-08-06 LAB — CBC WITH DIFFERENTIAL (CANCER CENTER ONLY)
Eosinophils Absolute: 0.1 10*3/uL (ref 0.0–0.5)
HCT: 34.2 % — ABNORMAL LOW (ref 34.8–46.6)
LYMPH#: 1.4 10*3/uL (ref 0.9–3.3)
LYMPH%: 32.7 % (ref 14.0–48.0)
MCHC: 32.5 g/dL (ref 32.0–36.0)
MCV: 85 fL (ref 81–101)
MONO#: 0.4 10*3/uL (ref 0.1–0.9)
MONO%: 8.9 % (ref 0.0–13.0)
NEUT%: 55.2 % (ref 39.6–80.0)
Platelets: 211 10*3/uL (ref 145–400)
RBC: 4.01 10*6/uL (ref 3.70–5.32)
WBC: 4.4 10*3/uL (ref 3.9–10.0)

## 2013-08-06 LAB — IRON AND TIBC CHCC
%SAT: 4 % — ABNORMAL LOW (ref 21–57)
Iron: 19 ug/dL — ABNORMAL LOW (ref 41–142)
TIBC: 469 ug/dL — ABNORMAL HIGH (ref 236–444)
UIBC: 450 ug/dL — ABNORMAL HIGH (ref 120–384)

## 2013-08-06 LAB — CMP (CANCER CENTER ONLY)
ALT(SGPT): 16 U/L (ref 10–47)
Alkaline Phosphatase: 53 U/L (ref 26–84)
BUN, Bld: 11 mg/dL (ref 7–22)
CO2: 30 mEq/L (ref 18–33)
Calcium: 9.2 mg/dL (ref 8.0–10.3)
Chloride: 106 mEq/L (ref 98–108)
Creat: 0.7 mg/dl (ref 0.6–1.2)
Glucose, Bld: 93 mg/dL (ref 73–118)
Total Bilirubin: 0.6 mg/dl (ref 0.20–1.60)

## 2013-08-06 MED ORDER — SODIUM CHLORIDE 0.9 % IV SOLN
Freq: Once | INTRAVENOUS | Status: AC
  Administered 2013-08-06: 15:00:00 via INTRAVENOUS

## 2013-08-06 MED ORDER — SODIUM CHLORIDE 0.9 % IV SOLN
1020.0000 mg | Freq: Once | INTRAVENOUS | Status: AC
  Administered 2013-08-06: 1020 mg via INTRAVENOUS
  Filled 2013-08-06: qty 34

## 2013-08-06 NOTE — Progress Notes (Signed)
This office note has been dictated.

## 2013-08-06 NOTE — Patient Instructions (Signed)

## 2013-08-07 NOTE — Progress Notes (Signed)
CC:   Pamela Ray, M.D.  DIAGNOSES: 1. Ductal carcinoma in situ of the left breast. 2. Recurrent iron deficiency anemia.  CURRENT THERAPY: 1. IV iron as indicated. 2. Fareston 60 mg p.o. daily.  INTERIM HISTORY:  Pamela Ray comes in for followup.  She is doing pretty well.  She does feel quite tired.  This usually is a great indicator for her to have low iron.  We last gave her iron back in June.  Back when we saw her in September, her ferritin was 60 with an iron saturation of 30%.  Again, I suspect that she probably is low again.  She has had no problems with cough.  She has had no fever.  There has been no bony pain.  She did have a recent mammogram.  Unfortunately, this must be in out of the system.  I will have to somehow get the results of this.  She said that it was negative.  She has had no rashes.  She has had no dysphagia or odynophagia.  She does have some migraines.  She is on Botox injections every 3 months for the migraines.  PHYSICAL EXAMINATION:  General:  This is a well-developed, well- nourished white female in no obvious distress.  Vital Signs: Temperature of 97.3, pulse 75, respiratory rate 14, blood pressure 109/62, weight is 143 pounds.  Head and Neck:  Normocephalic, atraumatic skull.  There are no ocular or oral lesions.  There are no palpable cervical or supraclavicular lymph nodes.  Lungs:  Clear bilaterally. Cardiac:  Regular rate and rhythm with a normal S1 and S2.  There are no murmurs, rubs, or bruits.  Abdomen:  Soft.  She has good bowel sounds. There is no fluid wave.  There is no palpable abdominal mass.  There is no palpable hepatosplenomegaly.  Breasts:  Right breast with no masses, edema, or erythema.  There is no right axillary adenopathy.  Left breast shows well-healed lumpectomy at the 2 o'clock position.  There is some firmness at the lumpectomy site.  She has no distinct mass in the left breast.  There is no nipple discharge.   There is no left axillary adenopathy.  Back:  No tenderness over the spine, ribs, or hips. Extremities:  No clubbing, cyanosis, or edema.  She has good range motion of her joints.  Skin:  No rashes, ecchymosis, or petechia.  LABORATORY STUDIES:  White cell count is 4.4, hemoglobin 11.1, hematocrit 34.2, platelet count 211.  MCV is 85.  IMPRESSION:  Pamela Ray is very charming 47 year old white female.  She is premenopausal.  We have her on Fareston for the DCIS.  I forgot to mention that she does report a discharge from the left nipple.  It is not bloody.  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 If this continues, she will probably need to be seen by Surgery and may need to have a procedure done.  We will go ahead and get herself with the iron today.  Again, this always works for her.  She really does well when she needs IV iron.  We will plan to get her back in 3 more months.  I think the iron today, which will be Feraheme 1020 mg, should help [place] out.  She was diagnosed with DCIS a year ago in December 2013.  I will plan to see her back in 3 months.    ______________________________ Josph Macho, M.D. PRE/MEDQ  D:  08/06/2013  T:  08/07/2013  Job:  [id number] 

## 2013-10-25 ENCOUNTER — Other Ambulatory Visit: Payer: Self-pay | Admitting: Nurse Practitioner

## 2013-10-25 DIAGNOSIS — D0510 Intraductal carcinoma in situ of unspecified breast: Secondary | ICD-10-CM

## 2013-10-25 MED ORDER — TOREMIFENE CITRATE 60 MG PO TABS: 60.0000 mg | ORAL_TABLET | Freq: Every day | ORAL | Status: DC

## 2013-10-25 NOTE — Telephone Encounter (Signed)
Pt called and requested her Fareston rx be sent in to [facility] on [address]. She usually gets 11mos supply from [facility] however, due to finances will be feasible to get 59month at this time. Request completed.

## 2013-10-26 ENCOUNTER — Encounter: Payer: Self-pay | Admitting: General Practice

## 2013-11-05 ENCOUNTER — Ambulatory Visit: Payer: 59

## 2013-11-05 ENCOUNTER — Ambulatory Visit (HOSPITAL_BASED_OUTPATIENT_CLINIC_OR_DEPARTMENT_OTHER): Payer: 59 | Admitting: Hematology & Oncology

## 2013-11-05 ENCOUNTER — Other Ambulatory Visit (HOSPITAL_BASED_OUTPATIENT_CLINIC_OR_DEPARTMENT_OTHER): Payer: 59 | Admitting: Lab

## 2013-11-05 VITALS — Ht 65.0 in | Wt 139.0 lb

## 2013-11-05 DIAGNOSIS — D509 Iron deficiency anemia, unspecified: Secondary | ICD-10-CM

## 2013-11-05 DIAGNOSIS — D0512 Intraductal carcinoma in situ of left breast: Secondary | ICD-10-CM

## 2013-11-05 DIAGNOSIS — D0590 Unspecified type of carcinoma in situ of unspecified breast: Secondary | ICD-10-CM

## 2013-11-05 LAB — CBC WITH DIFFERENTIAL (CANCER CENTER ONLY)
BASO#: 0 10*3/uL (ref 0.0–0.2)
BASO%: 0.2 % (ref 0.0–2.0)
EOS ABS: 0.1 10*3/uL (ref 0.0–0.5)
EOS%: 1.6 % (ref 0.0–7.0)
HCT: 38.9 % (ref 34.8–46.6)
HEMOGLOBIN: 13.3 g/dL (ref 11.6–15.9)
LYMPH#: 1.2 10*3/uL (ref 0.9–3.3)
LYMPH%: 28.1 % (ref 14.0–48.0)
MCH: 30 pg (ref 26.0–34.0)
MCHC: 34.2 g/dL (ref 32.0–36.0)
MCV: 88 fL (ref 81–101)
MONO#: 0.4 10*3/uL (ref 0.1–0.9)
MONO%: 10 % (ref 0.0–13.0)
NEUT#: 2.6 10*3/uL (ref 1.5–6.5)
NEUT%: 60.1 % (ref 39.6–80.0)
Platelets: 189 10*3/uL (ref 145–400)
RBC: 4.44 10*6/uL (ref 3.70–5.32)
RDW: 14.3 % (ref 11.1–15.7)
WBC: 4.3 10*3/uL (ref 3.9–10.0)

## 2013-11-05 LAB — CMP (CANCER CENTER ONLY)
ALT: 15 U/L (ref 10–47)
AST: 21 U/L (ref 11–38)
Albumin: 3.4 g/dL (ref 3.3–5.5)
Alkaline Phosphatase: 42 U/L (ref 26–84)
BILIRUBIN TOTAL: 0.6 mg/dL (ref 0.20–1.60)
BUN: 11 mg/dL (ref 7–22)
CO2: 28 meq/L (ref 18–33)
CREATININE: 0.6 mg/dL (ref 0.6–1.2)
Calcium: 9.1 mg/dL (ref 8.0–10.3)
Chloride: 105 mEq/L (ref 98–108)
Glucose, Bld: 123 mg/dL — ABNORMAL HIGH (ref 73–118)
Potassium: 3.7 mEq/L (ref 3.3–4.7)
Sodium: 134 mEq/L (ref 128–145)
Total Protein: 6.5 g/dL (ref 6.4–8.1)

## 2013-11-05 LAB — FERRITIN CHCC: Ferritin: 25 ng/ml (ref 9–269)

## 2013-11-05 LAB — RETICULOCYTES (CHCC)
ABS RETIC: 49.9 10*3/uL (ref 19.0–186.0)
RBC.: 4.54 MIL/uL (ref 3.87–5.11)
Retic Ct Pct: 1.1 % (ref 0.4–2.3)

## 2013-11-05 LAB — CHCC SATELLITE - SMEAR

## 2013-11-05 LAB — IRON AND TIBC CHCC
%SAT: 22 % (ref 21–57)
Iron: 79 ug/dL (ref 41–142)
TIBC: 361 ug/dL (ref 236–444)
UIBC: 282 ug/dL (ref 120–384)

## 2013-11-05 NOTE — Progress Notes (Signed)
Hematology and Oncology Follow Up Visit  Pamela Ray [id number] [birth year] 48 y.o. 11/05/2013   Principle Diagnosis:   DCIS of the left breast  Recurrent iron deficiency anemia  Worsening migraines  Current Therapy:    IV iron as indicated-patient last received a dose in December 2014  Fareston 60 mg po daily. We will hold this for now.     Interim History:  Pamela Ray is back for followup. Unfortunately, she's not doing well. She just has not felt well for quite a while. We did give her iron back in December. Her ferritin I think was 7. Usually this helps her out.  She does has had more the way of migraines and headaches. She has not had any problems with nausea vomiting. She just feels tired. She has no energy.  I will go ahead and hold the [facility] and we will see how this makes her feel. We will hold this for 4 weeks.  She's going to see her headache Dr. Marlowe Sax for the medicines that he gave her is making her feel this way. She recently was started on a new headache medicine.  Medications: Current outpatient prescriptions:aspirin 81 MG tablet, Take 81 mg by mouth daily., Disp: , Rfl: ;  baclofen (LIORESAL) 20 MG tablet, Take 10 mg by mouth as needed., Disp: , Rfl: ;  BOTOX 100 UNITS SOLR, Every 3 months, Disp: , Rfl: ;  desonide (DESOWEN) 0.05 % ointment, Apply 1 application topically 2 (two) times daily. , Disp: , Rfl: ;  docusate sodium (COLACE) 100 MG capsule, Take 100 mg by mouth as needed for mild constipation., Disp: , Rfl:  metaxalone (SKELAXIN) 800 MG tablet, Take 800 mg by mouth 2 (two) times daily as needed. For migraines, Disp: , Rfl: ;  PRESCRIPTION MEDICATION, Apply 1 application topically 2 (two) times daily as needed (Cortisone). Eczema cream, Disp: , Rfl: ;  rizatriptan (MAXALT) 10 MG tablet, , Disp: , Rfl:  SUMAtriptan (IMITREX) 100 MG tablet, Take 100 mg by mouth every 2 (two) hours as needed. Take 1 tablet at onset of migraine  may repeat in 2 hours  Limit use to  2 days a week , Disp: , Rfl: ;  tiZANidine (ZANAFLEX) 4 MG tablet, Take 4 mg by mouth as needed. , Disp: , Rfl: ;  zonisamide (ZONEGRAN) 50 MG capsule, , Disp: , Rfl: ;  Toremifene Citrate (FARESTON) 60 MG tablet, Take 1 tablet (60 mg total) by mouth daily., Disp: 30 tablet, Rfl: 0  Allergies:  Allergies  Allergen Reactions  . Cephalexin Itching  . Penicillins Hives    Past Medical History, Surgical history, Social history, and Family History were reviewed and updated.  Review of Systems: As above  Physical Exam:  height is 5\' 5"  (1.651 m) and weight is 139 lb (63.05 kg).   Well-developed and well-nourished. Head and neck exam shows no ocular or oral lesions. She has no nystagmus. No adenopathy is noted. Thyroid is not palpable. Lungs are clear. Cardiac exam regular rate and rhythm with no murmurs rubs or bruits. Abdomen is soft. Has good bowel sounds. There is no fluid wave. There is no palpable liver or spleen. Breasts exam shows right breast with no masses edema or erythema. There is no right axillary adenopathy. Left breast shows the lobectomy at the 2:00 position. This is well-healed. No mass is noted in the left breast. There is no left axillary adenopathy. Extremities shows good range of motion of her joints. She has no edema.  Has good strength. Neurological exam shows no focal neurological deficits. Skin exam no rashes.  Lab Results  Component Value Date   WBC 4.3 11/05/2013   HGB 13.3 11/05/2013   HCT 38.9 11/05/2013   MCV 88 11/05/2013   PLT 189 11/05/2013     Chemistry      Component Value Date/Time   NA 134 11/05/2013 0912   NA 136 05/14/2013 0933   K 3.7 11/05/2013 0912   K 4.0 05/14/2013 0933   CL 105 11/05/2013 0912   CL 104 05/14/2013 0933   CO2 28 11/05/2013 0912   CO2 26 05/14/2013 0933   BUN 11 11/05/2013 0912   BUN 12 05/14/2013 0933   CREATININE 0.6 11/05/2013 0912   CREATININE 0.58 05/14/2013 0933      Component Value Date/Time   CALCIUM 9.1 11/05/2013 0912   CALCIUM  9.4 05/14/2013 0933   ALKPHOS 42 11/05/2013 0912   ALKPHOS 42 05/14/2013 0933   AST 21 11/05/2013 0912   AST 15 05/14/2013 0933   ALT 15 11/05/2013 0912   ALT 10 05/14/2013 0933   BILITOT 0.60 11/05/2013 0912   BILITOT 0.5 05/14/2013 0933         Impression and Plan: Pamela Ray is 57 her with white female. She is. Menopausal. She has been onFareston now probably for 10 months. I will stop this. Again to stop it for a month will not hurt her.  I spent over half hour with her. I am trying to help her feel better. I does feel bad that she feels bad.  We will see what her iron studies show.  I will see her in one month.   Volanda Napoleon, MD 3/20/201510:38 AM

## 2013-11-07 ENCOUNTER — Other Ambulatory Visit: Payer: Self-pay | Admitting: *Deleted

## 2013-11-07 ENCOUNTER — Encounter: Payer: Self-pay | Admitting: *Deleted

## 2013-11-07 DIAGNOSIS — D509 Iron deficiency anemia, unspecified: Secondary | ICD-10-CM

## 2013-11-08 ENCOUNTER — Telehealth: Payer: Self-pay | Admitting: Hematology & Oncology

## 2013-11-08 NOTE — Telephone Encounter (Signed)
Pt aware of 3-25 iron infusion appointment

## 2013-11-10 ENCOUNTER — Ambulatory Visit (HOSPITAL_BASED_OUTPATIENT_CLINIC_OR_DEPARTMENT_OTHER): Payer: 59

## 2013-11-10 VITALS — BP 100/65 | HR 70 | Temp 98.3°F | Resp 16

## 2013-11-10 DIAGNOSIS — D509 Iron deficiency anemia, unspecified: Secondary | ICD-10-CM

## 2013-11-10 MED ORDER — SODIUM CHLORIDE 0.9 % IV SOLN
Freq: Once | INTRAVENOUS | Status: AC
Start: 2013-11-10 — End: 2013-11-10
  Administered 2013-11-10: 12:00:00 via INTRAVENOUS

## 2013-11-10 MED ORDER — SODIUM CHLORIDE 0.9 % IV SOLN
1020.0000 mg | Freq: Once | INTRAVENOUS | Status: AC
  Administered 2013-11-10: 1020 mg via INTRAVENOUS
  Filled 2013-11-10: qty 34

## 2013-11-10 NOTE — Patient Instructions (Signed)

## 2013-12-03 ENCOUNTER — Other Ambulatory Visit: Payer: 59 | Admitting: Lab

## 2013-12-03 ENCOUNTER — Ambulatory Visit (HOSPITAL_BASED_OUTPATIENT_CLINIC_OR_DEPARTMENT_OTHER): Payer: 59 | Admitting: Hematology & Oncology

## 2013-12-03 ENCOUNTER — Encounter: Payer: Self-pay | Admitting: Lab

## 2013-12-03 ENCOUNTER — Ambulatory Visit (HOSPITAL_BASED_OUTPATIENT_CLINIC_OR_DEPARTMENT_OTHER): Payer: 59 | Admitting: Lab

## 2013-12-03 ENCOUNTER — Ambulatory Visit: Payer: 59 | Admitting: Hematology & Oncology

## 2013-12-03 DIAGNOSIS — G43909 Migraine, unspecified, not intractable, without status migrainosus: Secondary | ICD-10-CM

## 2013-12-03 DIAGNOSIS — D0590 Unspecified type of carcinoma in situ of unspecified breast: Secondary | ICD-10-CM

## 2013-12-03 DIAGNOSIS — D509 Iron deficiency anemia, unspecified: Secondary | ICD-10-CM

## 2013-12-03 DIAGNOSIS — E559 Vitamin D deficiency, unspecified: Secondary | ICD-10-CM

## 2013-12-03 DIAGNOSIS — D0510 Intraductal carcinoma in situ of unspecified breast: Secondary | ICD-10-CM

## 2013-12-03 LAB — CMP (CANCER CENTER ONLY)
ALK PHOS: 58 U/L (ref 26–84)
ALT: 14 U/L (ref 10–47)
AST: 23 U/L (ref 11–38)
Albumin: 3.4 g/dL (ref 3.3–5.5)
BILIRUBIN TOTAL: 0.6 mg/dL (ref 0.20–1.60)
BUN, Bld: 14 mg/dL (ref 7–22)
CO2: 31 mEq/L (ref 18–33)
Calcium: 9.3 mg/dL (ref 8.0–10.3)
Chloride: 103 mEq/L (ref 98–108)
Creat: 0.9 mg/dl (ref 0.6–1.2)
Glucose, Bld: 92 mg/dL (ref 73–118)
Potassium: 3.6 mEq/L (ref 3.3–4.7)
SODIUM: 139 meq/L (ref 128–145)
TOTAL PROTEIN: 6.8 g/dL (ref 6.4–8.1)

## 2013-12-03 LAB — CBC WITH DIFFERENTIAL (CANCER CENTER ONLY)
BASO#: 0 10*3/uL (ref 0.0–0.2)
BASO%: 0.5 % (ref 0.0–2.0)
EOS%: 1.7 % (ref 0.0–7.0)
Eosinophils Absolute: 0.1 10*3/uL (ref 0.0–0.5)
HCT: 40.6 % (ref 34.8–46.6)
HGB: 13.8 g/dL (ref 11.6–15.9)
LYMPH#: 1.2 10*3/uL (ref 0.9–3.3)
LYMPH%: 29.3 % (ref 14.0–48.0)
MCH: 30.5 pg (ref 26.0–34.0)
MCHC: 34 g/dL (ref 32.0–36.0)
MCV: 90 fL (ref 81–101)
MONO#: 0.5 10*3/uL (ref 0.1–0.9)
MONO%: 12.5 % (ref 0.0–13.0)
NEUT%: 56 % (ref 39.6–80.0)
NEUTROS ABS: 2.4 10*3/uL (ref 1.5–6.5)
Platelets: 192 10*3/uL (ref 145–400)
RBC: 4.52 10*6/uL (ref 3.70–5.32)
RDW: 14.2 % (ref 11.1–15.7)
WBC: 4.2 10*3/uL (ref 3.9–10.0)

## 2013-12-05 NOTE — Progress Notes (Signed)
Hematology and Oncology Follow Up Visit  Pamela Ray [id number] 04/22/1966 48 y.o. 12/05/2013   Principle Diagnosis:   DCIS of the left breast  Recurrent iron deficiency anemia  Worsening migraines    Current Therapy:   IV iron as indicated.    Interim History:  Ms.  Ray is back for followup. She still feels rough. Being I did not make her feel better. Stop the ferrous.did not help. She thinks this is from her migraine medications. She's going to talk to her headache doctor about this.  She's had no bleeding now so that with the cycles. She's had no rashes. She's had no fever.  She was down in [place] for a meeting. She enjoy herself down there.  She's had no change in bowel or bladder habits. She has had no swelling. She's had no nausea vomiting.  Medications: Current outpatient prescriptions:aspirin 81 MG tablet, Take 81 mg by mouth daily., Disp: , Rfl: ;  baclofen (LIORESAL) 20 MG tablet, Take 10 mg by mouth as needed., Disp: , Rfl: ;  BOTOX 100 UNITS SOLR, Every 3 months, Disp: , Rfl: ;  desonide (DESOWEN) 0.05 % ointment, Apply 1 application topically 2 (two) times daily. , Disp: , Rfl: ;  docusate sodium (COLACE) 100 MG capsule, Take 100 mg by mouth as needed for mild constipation., Disp: , Rfl:  metaxalone (SKELAXIN) 800 MG tablet, Take 800 mg by mouth 2 (two) times daily as needed. For migraines, Disp: , Rfl: ;  PRESCRIPTION MEDICATION, Apply 1 application topically 2 (two) times daily as needed (Cortisone). Eczema cream, Disp: , Rfl: ;  rizatriptan (MAXALT) 10 MG tablet, , Disp: , Rfl:  SUMAtriptan (IMITREX) 100 MG tablet, Take 100 mg by mouth every 2 (two) hours as needed. Take 1 tablet at onset of migraine  may repeat in 2 hours  Limit use to 2 days a week , Disp: , Rfl: ;  tiZANidine (ZANAFLEX) 4 MG tablet, Take 4 mg by mouth as needed. , Disp: , Rfl: ;  Toremifene Citrate (FARESTON) 60 MG tablet, Take 1 tablet (60 mg total) by mouth daily., Disp: 30 tablet, Rfl: 0;   zonisamide (ZONEGRAN) 50 MG capsule, , Disp: , Rfl:   Allergies:  Allergies  Allergen Reactions  . Cephalexin Itching  . Penicillins Hives    Past Medical History, Surgical history, Social history, and Family History were reviewed and updated.  Review of Systems: As above  Physical Exam:  vitals were not taken for this visit.  Well-developed and well-nourished white female. Vital signs are temperature of 98. Pulse 82. Blood pressure 104/65. Weight is 141 pounds. Head and neck exam shows no ocular or oral lesions. Shows no palpable cervical or supraclavicular lymph nodes. Lungs are clear. Cardiac exam regular rate rhythm. Showed no murmurs rubs or bruits. Abdomen is soft. Has good bowel sounds. There is no fluid wave. There is no palpable abdominal mass. There is no palpable hepato- spleno megaly back exam no tenderness over the spine ribs or hips. Extremities shows no clubbing cyanosis or edema.  Lab Results  Component Value Date   WBC 4.2 12/03/2013   HGB 13.8 12/03/2013   HCT 40.6 12/03/2013   MCV 90 12/03/2013   PLT 192 12/03/2013     Chemistry      Component Value Date/Time   NA 139 12/03/2013 1319   NA 136 05/14/2013 0933   K 3.6 12/03/2013 1319   K 4.0 05/14/2013 0933   CL 103 12/03/2013 1319   CL  104 05/14/2013 0933   CO2 31 12/03/2013 1319   CO2 26 05/14/2013 0933   BUN 14 12/03/2013 1319   BUN 12 05/14/2013 0933   CREATININE 0.9 12/03/2013 1319   CREATININE 0.58 05/14/2013 0933      Component Value Date/Time   CALCIUM 9.3 12/03/2013 1319   CALCIUM 9.4 05/14/2013 0933   ALKPHOS 58 12/03/2013 1319   ALKPHOS 42 05/14/2013 0933   AST 23 12/03/2013 1319   AST 15 05/14/2013 0933   ALT 14 12/03/2013 1319   ALT 10 05/14/2013 0933   BILITOT 0.60 12/03/2013 1319   BILITOT 0.5 05/14/2013 0933         Impression and Plan: Pamela Ray is a very nice 48 year old white female. She has carcinoma in situ of the left breast. We have her on ferrous done. She's doing well with this. She'll get  back on it now. It did not make her feel the migraine medicine.  Will try get her back for a routine followup now. Will see back in her back in about 2 months or so.  Avoid see her back, we can discuss with her genetic testing given that she has the DCIS at a young age. I think there is a history of breast cancer in her family.   Volanda Napoleon, MD 4/19/20153:56 PM

## 2013-12-06 LAB — FERRITIN CHCC: Ferritin: 369 ng/ml — ABNORMAL HIGH (ref 9–269)

## 2013-12-06 LAB — IRON AND TIBC CHCC
%SAT: 35 % (ref 21–57)
Iron: 104 ug/dL (ref 41–142)
TIBC: 293 ug/dL (ref 236–444)
UIBC: 189 ug/dL (ref 120–384)

## 2013-12-07 ENCOUNTER — Encounter: Payer: Self-pay | Admitting: Nurse Practitioner

## 2014-02-24 ENCOUNTER — Telehealth: Payer: Self-pay | Admitting: Hematology & Oncology

## 2014-02-24 NOTE — Telephone Encounter (Signed)
Patient called and cx her 03/04/14 lab apt only and resch for 02/25/14

## 2014-02-25 ENCOUNTER — Other Ambulatory Visit (HOSPITAL_BASED_OUTPATIENT_CLINIC_OR_DEPARTMENT_OTHER): Payer: 59 | Admitting: Lab

## 2014-02-25 DIAGNOSIS — D509 Iron deficiency anemia, unspecified: Secondary | ICD-10-CM

## 2014-02-25 LAB — CBC WITH DIFFERENTIAL (CANCER CENTER ONLY)
BASO#: 0 10*3/uL (ref 0.0–0.2)
BASO%: 0.5 % (ref 0.0–2.0)
EOS%: 2.2 % (ref 0.0–7.0)
Eosinophils Absolute: 0.1 10*3/uL (ref 0.0–0.5)
HCT: 39.2 % (ref 34.8–46.6)
HGB: 13.4 g/dL (ref 11.6–15.9)
LYMPH#: 1 10*3/uL (ref 0.9–3.3)
LYMPH%: 23.5 % (ref 14.0–48.0)
MCH: 30.9 pg (ref 26.0–34.0)
MCHC: 34.2 g/dL (ref 32.0–36.0)
MCV: 91 fL (ref 81–101)
MONO#: 0.4 10*3/uL (ref 0.1–0.9)
MONO%: 8.5 % (ref 0.0–13.0)
NEUT#: 2.7 10*3/uL (ref 1.5–6.5)
NEUT%: 65.3 % (ref 39.6–80.0)
PLATELETS: 177 10*3/uL (ref 145–400)
RBC: 4.33 10*6/uL (ref 3.70–5.32)
RDW: 13.2 % (ref 11.1–15.7)
WBC: 4.1 10*3/uL (ref 3.9–10.0)

## 2014-02-25 LAB — IRON AND TIBC CHCC
%SAT: 28 % (ref 21–57)
IRON: 80 ug/dL (ref 41–142)
TIBC: 289 ug/dL (ref 236–444)
UIBC: 209 ug/dL (ref 120–384)

## 2014-02-25 LAB — FERRITIN CHCC: FERRITIN: 60 ng/mL (ref 9–269)

## 2014-02-26 LAB — COMPREHENSIVE METABOLIC PANEL
ALBUMIN: 3.9 g/dL (ref 3.5–5.2)
ALT: 14 U/L (ref 0–35)
AST: 17 U/L (ref 0–37)
Alkaline Phosphatase: 52 U/L (ref 39–117)
BUN: 12 mg/dL (ref 6–23)
CALCIUM: 9.6 mg/dL (ref 8.4–10.5)
CHLORIDE: 104 meq/L (ref 96–112)
CO2: 28 mEq/L (ref 19–32)
CREATININE: 0.55 mg/dL (ref 0.50–1.10)
Glucose, Bld: 80 mg/dL (ref 70–99)
Potassium: 4.4 mEq/L (ref 3.5–5.3)
Sodium: 139 mEq/L (ref 135–145)
Total Bilirubin: 0.5 mg/dL (ref 0.2–1.2)
Total Protein: 6.1 g/dL (ref 6.0–8.3)

## 2014-02-26 LAB — VITAMIN D 25 HYDROXY (VIT D DEFICIENCY, FRACTURES): Vit D, 25-Hydroxy: 29 ng/mL — ABNORMAL LOW (ref 30–89)

## 2014-02-28 ENCOUNTER — Telehealth: Payer: Self-pay | Admitting: *Deleted

## 2014-02-28 NOTE — Telephone Encounter (Addendum)
Message copied by Lenn Sink on Mon Feb 28, 2014 12:23 PM ------      Message from: Burney Gauze R      Created: Fri Feb 25, 2014 10:45 PM       Call - iron is a little lower but still ok!!  pete ------Informed pt that iron is little lower, but per Dr Marin Olp it is okay.

## 2014-03-02 ENCOUNTER — Telehealth: Payer: Self-pay | Admitting: *Deleted

## 2014-03-02 NOTE — Telephone Encounter (Addendum)
Message copied by Lenn Sink on Wed Mar 02, 2014  1:08 PM ------      Message from: Burney Gauze R      Created: Wed Mar 02, 2014  7:08 AM       Call-  Vit D is low!!  Need to take 2000units a day.  Pete ------Informed pt that vit d result came back being low. Dr Marin Olp wants the pt to take 2000 units vit d daily. Pt verbalized understanding.

## 2014-03-04 ENCOUNTER — Other Ambulatory Visit: Payer: 59 | Admitting: Lab

## 2014-03-04 ENCOUNTER — Encounter: Payer: Self-pay | Admitting: Hematology & Oncology

## 2014-03-04 ENCOUNTER — Other Ambulatory Visit: Payer: Self-pay | Admitting: *Deleted

## 2014-03-04 ENCOUNTER — Ambulatory Visit (HOSPITAL_BASED_OUTPATIENT_CLINIC_OR_DEPARTMENT_OTHER): Payer: 59 | Admitting: Hematology & Oncology

## 2014-03-04 VITALS — BP 106/60 | HR 84 | Temp 98.0°F | Resp 14 | Ht 65.0 in | Wt 140.0 lb

## 2014-03-04 DIAGNOSIS — R5382 Chronic fatigue, unspecified: Secondary | ICD-10-CM

## 2014-03-04 DIAGNOSIS — D0512 Intraductal carcinoma in situ of left breast: Secondary | ICD-10-CM

## 2014-03-04 DIAGNOSIS — D509 Iron deficiency anemia, unspecified: Secondary | ICD-10-CM

## 2014-03-04 DIAGNOSIS — D0590 Unspecified type of carcinoma in situ of unspecified breast: Secondary | ICD-10-CM

## 2014-03-04 DIAGNOSIS — M810 Age-related osteoporosis without current pathological fracture: Secondary | ICD-10-CM

## 2014-03-04 MED ORDER — TOREMIFENE CITRATE 60 MG PO TABS: 60.0000 mg | ORAL_TABLET | Freq: Every day | ORAL | Status: DC

## 2014-03-04 NOTE — Progress Notes (Signed)
Hematology and Oncology Follow Up Visit  Pamela Ray [id number] [birth year] 48 y.o. 03/04/2014   Principle Diagnosis:   DCIS of the left breast  Recurrent iron deficiency anemia  Current Therapy:    IV iron with Feraheme as indicated  Fareston 60mg  po q day     Interim History:  Ms.  Ray is back for followup. Her main problem continues to be fatigued. I am not sure what else we can do to try to solve this problem for her. We gave her iron about 3 months ago. This helped her a little bit.  She has had no weight gain. She is working okay. She's had no problems with migraines.  She is here to go on vacation in a week or so. She is looking forward to this.  She actually has been off the [employer].. She ran out and she has not been able to get another refill. We will take care of this today.  She still has her monthly cycles.  She's had no rashes. She's had no cough. There's been no nausea or vomiting. Medications: Current outpatient prescriptions:baclofen (LIORESAL) 20 MG tablet, Take 10 mg by mouth as needed., Disp: , Rfl: ;  BOTOX 100 UNITS SOLR, Every 3 months, Disp: , Rfl: ;  desonide (DESOWEN) 0.05 % ointment, Apply 1 application topically 2 (two) times daily. , Disp: , Rfl: ;  docusate sodium (COLACE) 100 MG capsule, Take 100 mg by mouth as needed for mild constipation., Disp: , Rfl:  fluticasone (CUTIVATE) 0.005 % ointment, Apply 1 application topically as needed. , Disp: , Rfl: ;  metaxalone (SKELAXIN) 800 MG tablet, Take 800 mg by mouth 2 (two) times daily as needed. For migraines, Disp: , Rfl: ;  PRESCRIPTION MEDICATION, Apply 1 application topically 2 (two) times daily as needed (Cortisone). Eczema cream, Disp: , Rfl:  SUMAtriptan (IMITREX) 100 MG tablet, Take 100 mg by mouth every 2 (two) hours as needed. Take 1 tablet at onset of migraine  may repeat in 2 hours  Limit use to 2 days a week , Disp: , Rfl: ;  tiZANidine (ZANAFLEX) 4 MG tablet, Take 4 mg by mouth as needed. ,  Disp: , Rfl: ;  Toremifene Citrate (FARESTON) 60 MG tablet, Take 1 tablet (60 mg total) by mouth daily., Disp: 90 tablet, Rfl: 0  Allergies:  Allergies  Allergen Reactions  . Cephalexin Itching  . Penicillins Hives    Past Medical History, Surgical history, Social history, and Family History were reviewed and updated.  Review of Systems: As above  Physical Exam:  height is 5\' 5"  (1.651 m) and weight is 140 lb (63.504 kg). Her oral temperature is 98 F (36.7 C). Her blood pressure is 106/60 and her pulse is 84. Her respiration is 14.   Well-developed and well-nourished white female. Head and neck exam shows no ocular or oral lesions. She has no periorbital edema. There is no intraoral lesions. She no adenopathy in the neck. Thyroid is not double. Lungs are clear. Cardiac exam regular in rhythm with no murmurs rubs or bruits. Breast exam shows right breast no masses edema or erythema. There is no right axillary adenopathy. Left breast a side effect from radiation. She is a well-healed likely scar at about the 2:00 position. There is no mass in the left breast. There is no left axillary adenopathy. Abdomen is soft. His good bowel sounds. There is no fluid wave. No palpable liver or spleen tip. Back exam shows no tenderness over the spine  ribs or hips. Extremities shows no clubbing cyanosis or edema. There is no edema of the left arm. She's good range most of her joints. Skin exam no rashes. Neurological exam is nonfocal.  Lab Results  Component Value Date   WBC 4.1 02/25/2014   HGB 13.4 02/25/2014   HCT 39.2 02/25/2014   MCV 91 02/25/2014   PLT 177 02/25/2014     Chemistry      Component Value Date/Time   NA 139 02/25/2014 0958   NA 139 12/03/2013 1319   K 4.4 02/25/2014 0958   K 3.6 12/03/2013 1319   CL 104 02/25/2014 0958   CL 103 12/03/2013 1319   CO2 28 02/25/2014 0958   CO2 31 12/03/2013 1319   BUN 12 02/25/2014 0958   BUN 14 12/03/2013 1319   CREATININE 0.55 02/25/2014 0958   CREATININE  0.9 12/03/2013 1319      Component Value Date/Time   CALCIUM 9.6 02/25/2014 0958   CALCIUM 9.3 12/03/2013 1319   ALKPHOS 52 02/25/2014 0958   ALKPHOS 58 12/03/2013 1319   AST 17 02/25/2014 0958   AST 23 12/03/2013 1319   ALT 14 02/25/2014 0958   ALT 14 12/03/2013 1319   BILITOT 0.5 02/25/2014 0958   BILITOT 0.60 12/03/2013 1319         Impression and Plan: Pamela Ray is 47 year old white female. She is a history of ductal carcinoma in situ of the left breast. She was diagnosed back in December of 2013. She underwent radiation. She is on Fareston We will keep her on 5 years of Fareston.  I don't think she needs any iron. We will recheck her iron in one month.  , I am not sure what else we can do about her fatigue. She had thyroid test done a year ago which were normal.  We will see her back ourselves in 3 months. She will come back in one month for labs.  Volanda Napoleon, MD 7/17/201511:15 AM

## 2014-04-01 ENCOUNTER — Other Ambulatory Visit (HOSPITAL_BASED_OUTPATIENT_CLINIC_OR_DEPARTMENT_OTHER): Payer: 59 | Admitting: Lab

## 2014-04-01 DIAGNOSIS — D509 Iron deficiency anemia, unspecified: Secondary | ICD-10-CM

## 2014-04-01 DIAGNOSIS — D0512 Intraductal carcinoma in situ of left breast: Secondary | ICD-10-CM

## 2014-04-01 DIAGNOSIS — R5382 Chronic fatigue, unspecified: Secondary | ICD-10-CM

## 2014-04-01 DIAGNOSIS — M810 Age-related osteoporosis without current pathological fracture: Secondary | ICD-10-CM

## 2014-04-01 DIAGNOSIS — D0590 Unspecified type of carcinoma in situ of unspecified breast: Secondary | ICD-10-CM

## 2014-04-01 LAB — CBC WITH DIFFERENTIAL (CANCER CENTER ONLY)
BASO#: 0 10*3/uL (ref 0.0–0.2)
BASO%: 0.6 % (ref 0.0–2.0)
EOS%: 2.6 % (ref 0.0–7.0)
Eosinophils Absolute: 0.1 10*3/uL (ref 0.0–0.5)
HCT: 40.8 % (ref 34.8–46.6)
HEMOGLOBIN: 14.1 g/dL (ref 11.6–15.9)
LYMPH#: 1.6 10*3/uL (ref 0.9–3.3)
LYMPH%: 32.2 % (ref 14.0–48.0)
MCH: 30.9 pg (ref 26.0–34.0)
MCHC: 34.6 g/dL (ref 32.0–36.0)
MCV: 89 fL (ref 81–101)
MONO#: 0.6 10*3/uL (ref 0.1–0.9)
MONO%: 12.4 % (ref 0.0–13.0)
NEUT%: 52.2 % (ref 39.6–80.0)
NEUTROS ABS: 2.6 10*3/uL (ref 1.5–6.5)
Platelets: 186 10*3/uL (ref 145–400)
RBC: 4.57 10*6/uL (ref 3.70–5.32)
RDW: 13.2 % (ref 11.1–15.7)
WBC: 5 10*3/uL (ref 3.9–10.0)

## 2014-04-01 LAB — IRON AND TIBC CHCC
%SAT: 23 % (ref 21–57)
Iron: 83 ug/dL (ref 41–142)
TIBC: 366 ug/dL (ref 236–444)
UIBC: 283 ug/dL (ref 120–384)

## 2014-04-01 LAB — RETICULOCYTES (CHCC)
ABS Retic: 50.7 10*3/uL (ref 19.0–186.0)
RBC.: 4.61 MIL/uL (ref 3.87–5.11)
Retic Ct Pct: 1.1 % (ref 0.4–2.3)

## 2014-04-01 LAB — FERRITIN CHCC: Ferritin: 44 ng/ml (ref 9–269)

## 2014-04-01 LAB — TSH CHCC: TSH: 2.697 m[IU]/L (ref 0.308–3.960)

## 2014-04-04 ENCOUNTER — Telehealth: Payer: Self-pay

## 2014-04-04 NOTE — Telephone Encounter (Addendum)
Message copied by Johny Drilling on Mon Apr 04, 2014 12:58 PM ------      Message from: Volanda Napoleon      Created: Fri Apr 01, 2014  4:31 PM       Call - iron and thyroid levels are ok!  pete ------ This information left on personalized VM with instructions to contact our office for questions. dph

## 2014-06-03 ENCOUNTER — Other Ambulatory Visit (HOSPITAL_BASED_OUTPATIENT_CLINIC_OR_DEPARTMENT_OTHER): Payer: 59 | Admitting: Lab

## 2014-06-03 ENCOUNTER — Ambulatory Visit (HOSPITAL_BASED_OUTPATIENT_CLINIC_OR_DEPARTMENT_OTHER): Payer: 59 | Admitting: Hematology & Oncology

## 2014-06-03 ENCOUNTER — Encounter: Payer: Self-pay | Admitting: Hematology & Oncology

## 2014-06-03 VITALS — BP 98/60 | HR 93 | Temp 98.6°F | Resp 14 | Ht 66.0 in | Wt 140.0 lb

## 2014-06-03 DIAGNOSIS — R5382 Chronic fatigue, unspecified: Secondary | ICD-10-CM

## 2014-06-03 DIAGNOSIS — M810 Age-related osteoporosis without current pathological fracture: Secondary | ICD-10-CM

## 2014-06-03 DIAGNOSIS — D509 Iron deficiency anemia, unspecified: Secondary | ICD-10-CM

## 2014-06-03 DIAGNOSIS — D0512 Intraductal carcinoma in situ of left breast: Secondary | ICD-10-CM

## 2014-06-03 LAB — CBC WITH DIFFERENTIAL (CANCER CENTER ONLY)
BASO#: 0 10e3/uL (ref 0.0–0.2)
BASO%: 0.5 % (ref 0.0–2.0)
EOS%: 1.6 % (ref 0.0–7.0)
Eosinophils Absolute: 0.1 10e3/uL (ref 0.0–0.5)
HCT: 40.9 % (ref 34.8–46.6)
HGB: 14.1 g/dL (ref 11.6–15.9)
LYMPH#: 1.4 10e3/uL (ref 0.9–3.3)
LYMPH%: 23.1 % (ref 14.0–48.0)
MCH: 29.7 pg (ref 26.0–34.0)
MCHC: 34.5 g/dL (ref 32.0–36.0)
MCV: 86 fL (ref 81–101)
MONO#: 0.6 10e3/uL (ref 0.1–0.9)
MONO%: 9.7 % (ref 0.0–13.0)
NEUT#: 4 10e3/uL (ref 1.5–6.5)
NEUT%: 65.1 % (ref 39.6–80.0)
Platelets: 208 10e3/uL (ref 145–400)
RBC: 4.74 10e6/uL (ref 3.70–5.32)
RDW: 13.2 % (ref 11.1–15.7)
WBC: 6.1 10e3/uL (ref 3.9–10.0)

## 2014-06-03 LAB — IRON AND TIBC CHCC
%SAT: 24 % (ref 21–57)
Iron: 99 ug/dL (ref 41–142)
TIBC: 416 ug/dL (ref 236–444)
UIBC: 317 ug/dL (ref 120–384)

## 2014-06-03 LAB — TSH CHCC: TSH: 1.626 m[IU]/L (ref 0.308–3.960)

## 2014-06-03 LAB — FERRITIN CHCC: Ferritin: 23 ng/mL (ref 9–269)

## 2014-06-03 MED ORDER — TOREMIFENE CITRATE 60 MG PO TABS: 60.0000 mg | ORAL_TABLET | Freq: Every day | ORAL | Status: DC

## 2014-06-04 LAB — COMPREHENSIVE METABOLIC PANEL
ALBUMIN: 4 g/dL (ref 3.5–5.2)
ALT: 10 U/L (ref 0–35)
AST: 15 U/L (ref 0–37)
Alkaline Phosphatase: 54 U/L (ref 39–117)
BUN: 15 mg/dL (ref 6–23)
CALCIUM: 9.8 mg/dL (ref 8.4–10.5)
CO2: 25 meq/L (ref 19–32)
CREATININE: 0.74 mg/dL (ref 0.50–1.10)
Chloride: 105 mEq/L (ref 96–112)
Glucose, Bld: 91 mg/dL (ref 70–99)
Potassium: 4.4 mEq/L (ref 3.5–5.3)
SODIUM: 139 meq/L (ref 135–145)
TOTAL PROTEIN: 6.5 g/dL (ref 6.0–8.3)
Total Bilirubin: 0.6 mg/dL (ref 0.2–1.2)

## 2014-06-04 LAB — MAGNESIUM: Magnesium: 2 mg/dL (ref 1.5–2.5)

## 2014-06-04 LAB — VITAMIN D 25 HYDROXY (VIT D DEFICIENCY, FRACTURES): Vit D, 25-Hydroxy: 29 ng/mL — ABNORMAL LOW (ref 30–89)

## 2014-06-05 NOTE — Progress Notes (Signed)
Hematology and Oncology Follow Up Visit  Pamela Ray [id number] [birth year] 48 y.o. 06/05/2014   Principle Diagnosis:   DCIS of the left breast  Recurrent iron deficiency anemia  Current Therapy:    IV iron with Feraheme as indicated  Fareston $RemoveB'60mg'EJWgkEUc$  po q day     Interim History:  Ms.  Ray is back for followup. Her main problem continues to be fatigued. She just got back from [place] city. She is there for a business trip. She did okay although she still has some tiredness. She did have a migraine while she was there. She does see a headache Dr. I think she will see him soon.  We'll I saw her in August, her ferritin was 44. Her iron saturation was 23%.  She  doing well with Fareston..  she has had some occasional arthralgias.  She and her family do have nice summer vacationI back in August. She actually met a brother who was given up for adoption before she was born. This was very emotional for her. She did have wonderful time meeting him and his family   She's had no rashes. She's had no cough. There's been no nausea or vomiting. Medications: Current outpatient prescriptions:baclofen (LIORESAL) 20 MG tablet, Take 10 mg by mouth as needed., Disp: , Rfl: ;  BOTOX 100 UNITS SOLR, Every 3 months, Disp: , Rfl: ;  desonide (DESOWEN) 0.05 % ointment, Apply 1 application topically 2 (two) times daily. , Disp: , Rfl: ;  docusate sodium (COLACE) 100 MG capsule, Take 100 mg by mouth as needed for mild constipation., Disp: , Rfl:  fluticasone (CUTIVATE) 0.005 % ointment, Apply 1 application topically as needed. , Disp: , Rfl: ;  metaxalone (SKELAXIN) 800 MG tablet, Take 800 mg by mouth 2 (two) times daily as needed. For migraines, Disp: , Rfl: ;  PRESCRIPTION MEDICATION, Apply 1 application topically 2 (two) times daily as needed (Cortisone). Eczema cream, Disp: , Rfl:  SUMAtriptan (IMITREX) 100 MG tablet, Take 100 mg by mouth every 2 (two) hours as needed. Take 1 tablet at onset of migraine  may  repeat in 2 hours  Limit use to 2 days a week , Disp: , Rfl: ;  tiZANidine (ZANAFLEX) 4 MG tablet, Take 4 mg by mouth as needed. , Disp: , Rfl: ;  Toremifene Citrate (FARESTON) 60 MG tablet, Take 1 tablet (60 mg total) by mouth daily., Disp: 90 tablet, Rfl: 3  Allergies:  Allergies  Allergen Reactions  . Cephalexin Itching  . Penicillins Hives    Past Medical History, Surgical history, Social history, and Family History were reviewed and updated.  Review of Systems: As above  Physical Exam:  height is $RemoveB'5\' 6"'akIlKTGC$  (1.676 m) and weight is 140 lb (63.504 kg). Her oral temperature is 98.6 F (37 C). Her blood pressure is 98/60 and her pulse is 93. Her respiration is 14.   Well-developed and well-nourished white female. Head and neck exam shows no ocular or oral lesions. She has no periorbital edema. There is no intraoral lesions. She no adenopathy in the neck. Thyroid is not double. Lungs are clear. Cardiac exam regular in rhythm with no murmurs rubs or bruits. Breast exam shows right breast no masses edema or erythema. There is no right axillary adenopathy. Left breast a side effect from radiation. She is a well-healed likely scar at about the 2:00 position. There is no mass in the left breast. There is no left axillary adenopathy. Abdomen is soft. His good bowel sounds. There  is no fluid wave. No palpable liver or spleen tip. Back exam shows no tenderness over the spine ribs or hips. Extremities shows no clubbing cyanosis or edema. There is no edema of the left arm. She's good range most of her joints. Skin exam no rashes. Neurological exam is nonfocal.  Lab Results  Component Value Date   WBC 6.1 06/03/2014   HGB 14.1 06/03/2014   HCT 40.9 06/03/2014   MCV 86 06/03/2014   PLT 208 06/03/2014     Chemistry      Component Value Date/Time   NA 139 06/03/2014 0914   NA 139 12/03/2013 1319   K 4.4 06/03/2014 0914   K 3.6 12/03/2013 1319   CL 105 06/03/2014 0914   CL 103 12/03/2013 1319   CO2 25  06/03/2014 0914   CO2 31 12/03/2013 1319   BUN 15 06/03/2014 0914   BUN 14 12/03/2013 1319   CREATININE 0.74 06/03/2014 0914   CREATININE 0.9 12/03/2013 1319      Component Value Date/Time   CALCIUM 9.8 06/03/2014 0914   CALCIUM 9.3 12/03/2013 1319   ALKPHOS 54 06/03/2014 0914   ALKPHOS 58 12/03/2013 1319   AST 15 06/03/2014 0914   AST 23 12/03/2013 1319   ALT 10 06/03/2014 0914   ALT 14 12/03/2013 1319   BILITOT 0.6 06/03/2014 0914   BILITOT 0.60 12/03/2013 1319       Ferritin is 23. Iron saturation 24%.  Vitamin D is 29   Impression and Plan: Pamela Ray is 48 year old white female. She is a history of ductal carcinoma in situ of the left breast. She was diagnosed back in December of 2013. She underwent radiation. She is on Fareston We will keep her on 5 years of Fareston.  We will see about given her iron. She always responds well to iron.  I'll plan to see her back in another couple months.   Nile Riggs, MD 10/18/201512:49 PM

## 2014-06-08 ENCOUNTER — Ambulatory Visit (HOSPITAL_BASED_OUTPATIENT_CLINIC_OR_DEPARTMENT_OTHER): Payer: 59

## 2014-06-08 VITALS — BP 111/73 | HR 76 | Temp 98.2°F | Resp 20

## 2014-06-08 DIAGNOSIS — D509 Iron deficiency anemia, unspecified: Secondary | ICD-10-CM

## 2014-06-08 DIAGNOSIS — Z23 Encounter for immunization: Secondary | ICD-10-CM

## 2014-06-08 MED ORDER — INFLUENZA VAC SPLIT QUAD 0.5 ML IM SUSY
0.5000 mL | PREFILLED_SYRINGE | Freq: Once | INTRAMUSCULAR | Status: AC
  Administered 2014-06-08: 0.5 mL via INTRAMUSCULAR
  Filled 2014-06-08: qty 0.5

## 2014-06-08 MED ORDER — SODIUM CHLORIDE 0.9 % IV SOLN
Freq: Once | INTRAVENOUS | Status: AC
  Administered 2014-06-08: 16:00:00 via INTRAVENOUS

## 2014-06-08 MED ORDER — FAMOTIDINE IN NACL 20-0.9 MG/50ML-% IV SOLN
20.0000 mg | Freq: Two times a day (BID) | INTRAVENOUS | Status: DC
  Administered 2014-06-08: 20 mg via INTRAVENOUS

## 2014-06-08 MED ORDER — SODIUM CHLORIDE 0.9 % IV SOLN
1020.0000 mg | Freq: Once | INTRAVENOUS | Status: AC
  Administered 2014-06-08: 1020 mg via INTRAVENOUS
  Filled 2014-06-08: qty 34

## 2014-06-08 MED ORDER — METHYLPREDNISOLONE SODIUM SUCC 125 MG IJ SOLR
125.0000 mg | Freq: Once | INTRAMUSCULAR | Status: AC
  Administered 2014-06-08: 125 mg via INTRAVENOUS

## 2014-06-08 NOTE — Patient Instructions (Signed)
Influenza Virus Vaccine injection (Fluarix) What is this medicine? INFLUENZA VIRUS VACCINE (in floo EN zuh VAHY ruhs vak SEEN) helps to reduce the risk of getting influenza also known as the flu. This medicine may be used for other purposes; ask your health care provider or pharmacist if you have questions. COMMON BRAND NAME(S): Fluarix, Fluzone What should I tell my health care provider before I take this medicine? They need to know if you have any of these conditions: -bleeding disorder like hemophilia -fever or infection -Guillain-Barre syndrome or other neurological problems -immune system problems -infection with the human immunodeficiency virus (HIV) or AIDS -low blood platelet counts -multiple sclerosis -an unusual or allergic reaction to influenza virus vaccine, eggs, chicken proteins, latex, gentamicin, other medicines, foods, dyes or preservatives -pregnant or trying to get pregnant -breast-feeding How should I use this medicine? This vaccine is for injection into a muscle. It is given by a health care professional. A copy of Vaccine Information Statements will be given before each vaccination. Read this sheet carefully each time. The sheet may change frequently. Talk to your pediatrician regarding the use of this medicine in children. Special care may be needed. Overdosage: If you think you have taken too much of this medicine contact a poison control center or emergency room at once. NOTE: This medicine is only for you. Do not share this medicine with others. What if I miss a dose? This does not apply. What may interact with this medicine? -chemotherapy or radiation therapy -medicines that lower your immune system like etanercept, anakinra, infliximab, and adalimumab -medicines that treat or prevent blood clots like warfarin -phenytoin -steroid medicines like prednisone or cortisone -theophylline -vaccines This list may not describe all possible interactions. Give your  health care provider a list of all the medicines, herbs, non-prescription drugs, or dietary supplements you use. Also tell them if you smoke, drink alcohol, or use illegal drugs. Some items may interact with your medicine. What should I watch for while using this medicine? Report any side effects that do not go away within 3 days to your doctor or health care professional. Call your health care provider if any unusual symptoms occur within 6 weeks of receiving this vaccine. You may still catch the flu, but the illness is not usually as bad. You cannot get the flu from the vaccine. The vaccine will not protect against colds or other illnesses that may cause fever. The vaccine is needed every year. What side effects may I notice from receiving this medicine? Side effects that you should report to your doctor or health care professional as soon as possible: -allergic reactions like skin rash, itching or hives, swelling of the face, lips, or tongue Side effects that usually do not require medical attention (report to your doctor or health care professional if they continue or are bothersome): -fever -headache -muscle aches and pains -pain, tenderness, redness, or swelling at site where injected -weak or tired This list may not describe all possible side effects. Call your doctor for medical advice about side effects. You may report side effects to FDA at 1-800-FDA-1088. Where should I keep my medicine? This vaccine is only given in a clinic, pharmacy, doctor's office, or other health care setting and will not be stored at home. NOTE: This sheet is a summary. It may not cover all possible information. If you have questions about this medicine, talk to your doctor, pharmacist, or health care provider.  2015, Elsevier/Gold Standard. (2008-03-02 09:30:40) Ferumoxytol injection What is this   medicine? FERUMOXYTOL is an iron complex. Iron is used to make healthy red blood cells, which carry oxygen and  nutrients throughout the body. This medicine is used to treat iron deficiency anemia in people with chronic kidney disease. This medicine may be used for other purposes; ask your health care provider or pharmacist if you have questions. COMMON BRAND NAME(S): Feraheme What should I tell my health care provider before I take this medicine? They need to know if you have any of these conditions: -anemia not caused by low iron levels -high levels of iron in the blood -magnetic resonance imaging (MRI) test scheduled -an unusual or allergic reaction to iron, other medicines, foods, dyes, or preservatives -pregnant or trying to get pregnant -breast-feeding How should I use this medicine? This medicine is for injection into a vein. It is given by a health care professional in a hospital or clinic setting. Talk to your pediatrician regarding the use of this medicine in children. Special care may be needed. Overdosage: If you think you've taken too much of this medicine contact a poison control center or emergency room at once. Overdosage: If you think you have taken too much of this medicine contact a poison control center or emergency room at once. NOTE: This medicine is only for you. Do not share this medicine with others. What if I miss a dose? It is important not to miss your dose. Call your doctor or health care professional if you are unable to keep an appointment. What may interact with this medicine? This medicine may interact with the following medications: -other iron products This list may not describe all possible interactions. Give your health care provider a list of all the medicines, herbs, non-prescription drugs, or dietary supplements you use. Also tell them if you smoke, drink alcohol, or use illegal drugs. Some items may interact with your medicine. What should I watch for while using this medicine? Visit your doctor or healthcare professional regularly. Tell your doctor or  healthcare professional if your symptoms do not start to get better or if they get worse. You may need blood work done while you are taking this medicine. You may need to follow a special diet. Talk to your doctor. Foods that contain iron include: whole grains/cereals, dried fruits, beans, or peas, leafy green vegetables, and organ meats (liver, kidney). What side effects may I notice from receiving this medicine? Side effects that you should report to your doctor or health care professional as soon as possible: -allergic reactions like skin rash, itching or hives, swelling of the face, lips, or tongue -breathing problems -changes in blood pressure -feeling faint or lightheaded, falls -fever or chills -flushing, sweating, or hot feelings -swelling of the ankles or feet Side effects that usually do not require medical attention (Report these to your doctor or health care professional if they continue or are bothersome.): -diarrhea -headache -nausea, vomiting -stomach pain This list may not describe all possible side effects. Call your doctor for medical advice about side effects. You may report side effects to FDA at 1-800-FDA-1088. Where should I keep my medicine? This drug is given in a hospital or clinic and will not be stored at home. NOTE: This sheet is a summary. It may not cover all possible information. If you have questions about this medicine, talk to your doctor, pharmacist, or health care provider.  2015, Elsevier/Gold Standard. (2012-03-20 15:23:36)  

## 2014-06-08 NOTE — Progress Notes (Signed)
1605  Complain of throat burning and eyes and ears itching, iron had infused. Also complained of some chest tightness. Normal Saline bolus started. Solu-Medrol and Pepcid given per [facility] [place]'s  verbal order.  VSS. 4:26 PM No complaint of ears or throat burning, chest continues to feel tight.  [facility] aware. 4:50 PM Feeling better, husband to take patient home.  Pt/husband verbalized understanding of reaction and to take Benadryl 50mg  PO when arriving home.

## 2014-09-02 ENCOUNTER — Encounter: Payer: Self-pay | Admitting: Hematology & Oncology

## 2014-09-02 ENCOUNTER — Other Ambulatory Visit (HOSPITAL_BASED_OUTPATIENT_CLINIC_OR_DEPARTMENT_OTHER): Payer: 59 | Admitting: Lab

## 2014-09-02 ENCOUNTER — Ambulatory Visit (HOSPITAL_BASED_OUTPATIENT_CLINIC_OR_DEPARTMENT_OTHER): Payer: 59 | Admitting: Hematology & Oncology

## 2014-09-02 VITALS — BP 115/66 | HR 78 | Temp 98.4°F | Resp 14 | Ht 66.0 in | Wt 145.0 lb

## 2014-09-02 DIAGNOSIS — E559 Vitamin D deficiency, unspecified: Secondary | ICD-10-CM

## 2014-09-02 DIAGNOSIS — Z170 Estrogen receptor positive status [ER+]: Secondary | ICD-10-CM

## 2014-09-02 DIAGNOSIS — D509 Iron deficiency anemia, unspecified: Secondary | ICD-10-CM

## 2014-09-02 DIAGNOSIS — D0512 Intraductal carcinoma in situ of left breast: Secondary | ICD-10-CM

## 2014-09-02 DIAGNOSIS — D0510 Intraductal carcinoma in situ of unspecified breast: Secondary | ICD-10-CM

## 2014-09-02 LAB — COMPREHENSIVE METABOLIC PANEL (CC13)
ALK PHOS: 54 U/L (ref 40–150)
ALT: 13 U/L (ref 0–55)
AST: 16 U/L (ref 5–34)
Albumin: 3.6 g/dL (ref 3.5–5.0)
Anion Gap: 7 mEq/L (ref 3–11)
BUN: 9 mg/dL (ref 7.0–26.0)
CHLORIDE: 106 meq/L (ref 98–109)
CO2: 25 meq/L (ref 22–29)
CREATININE: 0.6 mg/dL (ref 0.6–1.1)
Calcium: 8.9 mg/dL (ref 8.4–10.4)
Glucose: 100 mg/dl (ref 70–140)
POTASSIUM: 4.1 meq/L (ref 3.5–5.1)
Sodium: 138 mEq/L (ref 136–145)
TOTAL PROTEIN: 6.2 g/dL — AB (ref 6.4–8.3)
Total Bilirubin: 0.67 mg/dL (ref 0.20–1.20)

## 2014-09-02 LAB — IRON AND TIBC CHCC
%SAT: 23 % (ref 21–57)
IRON: 64 ug/dL (ref 41–142)
TIBC: 279 ug/dL (ref 236–444)
UIBC: 215 ug/dL (ref 120–384)

## 2014-09-02 LAB — CBC WITH DIFFERENTIAL (CANCER CENTER ONLY)
BASO#: 0 10*3/uL (ref 0.0–0.2)
BASO%: 0.5 % (ref 0.0–2.0)
EOS%: 4.4 % (ref 0.0–7.0)
Eosinophils Absolute: 0.3 10*3/uL (ref 0.0–0.5)
HCT: 38.8 % (ref 34.8–46.6)
HEMOGLOBIN: 13.2 g/dL (ref 11.6–15.9)
LYMPH#: 1.1 10*3/uL (ref 0.9–3.3)
LYMPH%: 17.3 % (ref 14.0–48.0)
MCH: 30.6 pg (ref 26.0–34.0)
MCHC: 34 g/dL (ref 32.0–36.0)
MCV: 90 fL (ref 81–101)
MONO#: 0.6 10*3/uL (ref 0.1–0.9)
MONO%: 9.2 % (ref 0.0–13.0)
NEUT#: 4.2 10*3/uL (ref 1.5–6.5)
NEUT%: 68.6 % (ref 39.6–80.0)
Platelets: 180 10*3/uL (ref 145–400)
RBC: 4.31 10*6/uL (ref 3.70–5.32)
RDW: 13.8 % (ref 11.1–15.7)
WBC: 6.1 10*3/uL (ref 3.9–10.0)

## 2014-09-02 LAB — FERRITIN CHCC: FERRITIN: 146 ng/mL (ref 9–269)

## 2014-09-02 NOTE — Progress Notes (Signed)
Hematology and Oncology Follow Up Visit  Pamela Ray [id number] 03/13/1966 49 y.o. 09/02/2014   Principle Diagnosis:   DCIS of the left breast  Recurrent iron deficiency anemia  Current Therapy:    IV iron with Feraheme as indicated  Fareston 60mg  po q day     Interim History:  Ms.  Ray is back for followup. Her big problem now that she has a periungual wart on the thumb on her right hand. This is been incredibly painful for her. She had she's gone to [facility] to try to help this. She's had laser therapy. She's had topical therapy. Nothing is helping. She is told that surgery does not help.  Again, this is causing quite a bit of pain for her. She had she has some oxycodone that she takes which she does not like to take.  I went ahead and gave her some and will cream to see this might help as a topical anesthetic.  I did a little bit of research. I gave her the name of a topical powder that has been used before. This is referred to as  DPCP. Otherwise, she asked he feels okay. The headaches are not as bad. She is not as tired.  She is doing well with the Fareston.  The last time we gave her iron was in October 2015.  She still has her monthly cycles.  Medications:  Current outpatient prescriptions:  .  baclofen (LIORESAL) 20 MG tablet, Take 10 mg by mouth as needed., Disp: , Rfl:  .  BOTOX 100 UNITS SOLR, Every 3 months, Disp: , Rfl:  .  desonide (DESOWEN) 0.05 % ointment, Apply 1 application topically 2 (two) times daily. , Disp: , Rfl:  .  docusate sodium (COLACE) 100 MG capsule, Take 100 mg by mouth as needed for mild constipation., Disp: , Rfl:  .  fluticasone (CUTIVATE) 0.005 % ointment, Apply 1 application topically as needed. , Disp: , Rfl:  .  metaxalone (SKELAXIN) 800 MG tablet, Take 800 mg by mouth 2 (two) times daily as needed. For migraines, Disp: , Rfl:  .  PRESCRIPTION MEDICATION, Apply 1 application topically 2 (two) times daily as needed (Cortisone). Eczema  cream, Disp: , Rfl:  .  SUMAtriptan (IMITREX) 100 MG tablet, Take 100 mg by mouth every 2 (two) hours as needed. Take 1 tablet at onset of migraine  may repeat in 2 hours  Limit use to 2 days a week , Disp: , Rfl:  .  tiZANidine (ZANAFLEX) 4 MG tablet, Take 4 mg by mouth as needed. , Disp: , Rfl:  .  Toremifene Citrate (FARESTON) 60 MG tablet, Take 1 tablet (60 mg total) by mouth daily., Disp: 90 tablet, Rfl: 3  Allergies:  Allergies  Allergen Reactions  . Cephalexin Itching  . Penicillins Hives    Past Medical History, Surgical history, Social history, and Family History were reviewed and updated.  Review of Systems: As above  Physical Exam:  height is 5\' 6"  (1.676 m) and weight is 145 lb (65.772 kg). Her oral temperature is 98.4 F (36.9 C). Her blood pressure is 115/66 and her pulse is 78. Her respiration is 14.   Well-developed and well-nourished white female. Head and neck exam shows no ocular or oral lesions. She has no periorbital edema. There is no intraoral lesions. She no adenopathy in the neck. Thyroid is not double. Lungs are clear. Cardiac exam regular in rhythm with no murmurs rubs or bruits. Breast exam shows right breast no  masses edema or erythema. There is no right axillary adenopathy. Left breast a side effect from radiation. She is a well-healed likely scar at about the 2:00 position. There is no mass in the left breast. There is no left axillary adenopathy. Abdomen is soft. His good bowel sounds. There is no fluid wave. No palpable liver or spleen tip. Back exam shows no tenderness over the spine ribs or hips. Extremities shows no clubbing cyanosis or edema. She has the periungual wart on the medial aspect of the right thumb..  Lab Results  Component Value Date   WBC 6.1 09/02/2014   HGB 13.2 09/02/2014   HCT 38.8 09/02/2014   MCV 90 09/02/2014   PLT 180 09/02/2014     Chemistry      Component Value Date/Time   NA 138 09/02/2014 0915   NA 139 06/03/2014 0914    NA 139 12/03/2013 1319   K 4.1 09/02/2014 0915   K 4.4 06/03/2014 0914   K 3.6 12/03/2013 1319   CL 105 06/03/2014 0914   CL 103 12/03/2013 1319   CO2 25 09/02/2014 0915   CO2 25 06/03/2014 0914   CO2 31 12/03/2013 1319   BUN 9.0 09/02/2014 0915   BUN 15 06/03/2014 0914   BUN 14 12/03/2013 1319   CREATININE 0.6 09/02/2014 0915   CREATININE 0.74 06/03/2014 0914   CREATININE 0.9 12/03/2013 1319      Component Value Date/Time   CALCIUM 8.9 09/02/2014 0915   CALCIUM 9.8 06/03/2014 0914   CALCIUM 9.3 12/03/2013 1319   ALKPHOS 54 09/02/2014 0915   ALKPHOS 54 06/03/2014 0914   ALKPHOS 58 12/03/2013 1319   AST 16 09/02/2014 0915   AST 15 06/03/2014 0914   AST 23 12/03/2013 1319   ALT 13 09/02/2014 0915   ALT 10 06/03/2014 0914   ALT 14 12/03/2013 1319   BILITOT 0.67 09/02/2014 0915   BILITOT 0.6 06/03/2014 0914   BILITOT 0.60 12/03/2013 1319       Ferritin is 146. Iron saturation 23 %.    Impression and Plan: Pamela Ray is 49 year old white female. She is a history of ductal carcinoma in situ of the left breast. She was diagnosed back in December of 2013. She underwent radiation. She is on Fareston We will keep her on 5 years of Fareston.  Her iron level is good so I don't think we are to give her any iron.  Hopefully, this periungual wart can be treated. This is been incredibly painful for her.  I spent about 30 minutes with her trying to help her out with this wart situation.   I'll plan to see her back in another couple months.   Nile Riggs, MD 1/15/20165:35 PM

## 2014-09-03 LAB — VITAMIN D 25 HYDROXY (VIT D DEFICIENCY, FRACTURES): VIT D 25 HYDROXY: 21 ng/mL — AB (ref 30–100)

## 2014-09-05 ENCOUNTER — Telehealth: Payer: Self-pay | Admitting: *Deleted

## 2014-09-05 NOTE — Telephone Encounter (Signed)
-----   Message from Volanda Napoleon, MD sent at 09/02/2014  2:04 PM EST ----- Call and tell her that iron level is okay. Thanks

## 2014-09-06 ENCOUNTER — Telehealth: Payer: Self-pay | Admitting: *Deleted

## 2014-09-06 NOTE — Telephone Encounter (Signed)
-----   Message from Volanda Napoleon, MD sent at 09/06/2014 11:24 AM EST ----- Call - vit d is very low. Have her take 50,000units a week.  Call this in for her to pharmacy. pere

## 2014-09-07 ENCOUNTER — Telehealth: Payer: Self-pay | Admitting: *Deleted

## 2014-09-07 DIAGNOSIS — D0512 Intraductal carcinoma in situ of left breast: Secondary | ICD-10-CM

## 2014-09-07 MED ORDER — ERGOCALCIFEROL 1.25 MG (50000 UT) PO CAPS: 50000.0000 [IU] | ORAL_CAPSULE | ORAL | Status: DC

## 2014-09-07 NOTE — Telephone Encounter (Addendum)
Spoke with patient. Medication sent into pharmacy  ----- Message from Volanda Napoleon, MD sent at 09/06/2014 11:24 AM EST ----- Call - vit d is very low. Have her take 50,000units a week.  Call this in for her to pharmacy. pere

## 2014-12-02 ENCOUNTER — Other Ambulatory Visit (HOSPITAL_BASED_OUTPATIENT_CLINIC_OR_DEPARTMENT_OTHER): Payer: 59

## 2014-12-02 ENCOUNTER — Ambulatory Visit (HOSPITAL_BASED_OUTPATIENT_CLINIC_OR_DEPARTMENT_OTHER): Payer: 59 | Admitting: Hematology & Oncology

## 2014-12-02 ENCOUNTER — Encounter: Payer: Self-pay | Admitting: Hematology & Oncology

## 2014-12-02 VITALS — BP 106/70 | HR 72 | Temp 97.8°F | Resp 14 | Ht 66.0 in | Wt 142.0 lb

## 2014-12-02 DIAGNOSIS — D509 Iron deficiency anemia, unspecified: Secondary | ICD-10-CM

## 2014-12-02 DIAGNOSIS — Z79810 Long term (current) use of selective estrogen receptor modulators (SERMs): Secondary | ICD-10-CM

## 2014-12-02 DIAGNOSIS — D0512 Intraductal carcinoma in situ of left breast: Secondary | ICD-10-CM | POA: Diagnosis not present

## 2014-12-02 DIAGNOSIS — M810 Age-related osteoporosis without current pathological fracture: Secondary | ICD-10-CM

## 2014-12-02 LAB — CBC WITH DIFFERENTIAL (CANCER CENTER ONLY)
BASO#: 0 10*3/uL (ref 0.0–0.2)
BASO%: 0.4 % (ref 0.0–2.0)
EOS%: 1.7 % (ref 0.0–7.0)
Eosinophils Absolute: 0.1 10*3/uL (ref 0.0–0.5)
HCT: 39 % (ref 34.8–46.6)
HEMOGLOBIN: 13.1 g/dL (ref 11.6–15.9)
LYMPH#: 1.1 10*3/uL (ref 0.9–3.3)
LYMPH%: 23.5 % (ref 14.0–48.0)
MCH: 29.9 pg (ref 26.0–34.0)
MCHC: 33.6 g/dL (ref 32.0–36.0)
MCV: 89 fL (ref 81–101)
MONO#: 0.4 10*3/uL (ref 0.1–0.9)
MONO%: 8 % (ref 0.0–13.0)
NEUT%: 66.4 % (ref 39.6–80.0)
NEUTROS ABS: 3.1 10*3/uL (ref 1.5–6.5)
Platelets: 190 10*3/uL (ref 145–400)
RBC: 4.38 10*6/uL (ref 3.70–5.32)
RDW: 13.5 % (ref 11.1–15.7)
WBC: 4.7 10*3/uL (ref 3.9–10.0)

## 2014-12-02 LAB — COMPREHENSIVE METABOLIC PANEL
ALBUMIN: 3.8 g/dL (ref 3.5–5.2)
ALT: 14 U/L (ref 0–35)
AST: 20 U/L (ref 0–37)
Alkaline Phosphatase: 48 U/L (ref 39–117)
BUN: 15 mg/dL (ref 6–23)
CO2: 26 mEq/L (ref 19–32)
Calcium: 9 mg/dL (ref 8.4–10.5)
Chloride: 106 mEq/L (ref 96–112)
Creatinine, Ser: 0.57 mg/dL (ref 0.50–1.10)
Glucose, Bld: 95 mg/dL (ref 70–99)
Potassium: 4.3 mEq/L (ref 3.5–5.3)
SODIUM: 141 meq/L (ref 135–145)
TOTAL PROTEIN: 6.3 g/dL (ref 6.0–8.3)
Total Bilirubin: 0.5 mg/dL (ref 0.2–1.2)

## 2014-12-02 LAB — IRON AND TIBC
%SAT: 15 % — AB (ref 20–55)
Iron: 53 ug/dL (ref 42–145)
TIBC: 356 ug/dL (ref 250–470)
UIBC: 303 ug/dL (ref 125–400)

## 2014-12-02 LAB — FERRITIN: Ferritin: 22 ng/mL (ref 10–291)

## 2014-12-02 MED ORDER — TOREMIFENE CITRATE 60 MG PO TABS: 60.0000 mg | ORAL_TABLET | Freq: Every day | ORAL | Status: DC

## 2014-12-02 NOTE — Progress Notes (Signed)
Hematology and Oncology Follow Up Visit  Pamela Ray [id number] 1965-11-24 49 y.o. 12/02/2014   Principle Diagnosis:   DCIS of the left breast  Recurrent iron deficiency anemia  Current Therapy:    IV iron with Feraheme as indicated  Fareston 60mg  po q day     Interim History:  Pamela Ray is back for followup. Her right thumb is doing a lot better. She had a wart under the fingernail. This was treated with some injections. His is doing a lot better for her.  Unfortunately, there is still a lot of stress at home. His really been tough on her.  She is still working. She is asked to going out to lunch with some of her colleagues today.  She's had no other issues. She does get migraines. Because of all the stress, she has had some migraines this week.  Her kids are doing okay. One son will be going off to college this year at [employer].   Her iron studies done back in generation of a ferritin of 146 and iron saturation of 23%.  She still has her monthly cycles.  Medications:  Current outpatient prescriptions:  .  BOTOX 100 UNITS SOLR, Every 3 months, Disp: , Rfl:  .  desonide (DESOWEN) 0.05 % ointment, Apply 1 application topically 2 (two) times daily. , Disp: , Rfl:  .  docusate sodium (COLACE) 100 MG capsule, Take 100 mg by mouth as needed for mild constipation., Disp: , Rfl:  .  ergocalciferol (VITAMIN D2) 50000 UNITS capsule, Take 1 capsule (50,000 Units total) by mouth once a week., Disp: 4 capsule, Rfl: 3 .  fluticasone (CUTIVATE) 0.005 % ointment, Apply 1 application topically as needed. , Disp: , Rfl:  .  metaxalone (SKELAXIN) 800 MG tablet, Take 800 mg by mouth 2 (two) times daily as needed. For migraines, Disp: , Rfl:  .  PRESCRIPTION MEDICATION, Apply 1 application topically 2 (two) times daily as needed (Cortisone). Eczema cream, Disp: , Rfl:  .  SUMAtriptan (IMITREX) 100 MG tablet, Take 100 mg by mouth every 2 (two) hours as needed. Take 1 tablet at onset of migraine   may repeat in 2 hours  Limit use to 2 days a week , Disp: , Rfl:  .  Toremifene Citrate (FARESTON) 60 MG tablet, Take 1 tablet (60 mg total) by mouth daily., Disp: 90 tablet, Rfl: 3  Allergies:  Allergies  Allergen Reactions  . Cephalexin Itching  . Penicillins Hives    Past Medical History, Surgical history, Social history, and Family History were reviewed and updated.  Review of Systems: As above  Physical Exam:  height is 5\' 6"  (1.676 m) and weight is 142 lb (64.411 kg). Her oral temperature is 97.8 F (36.6 C). Her blood pressure is 106/70 and her pulse is 72. Her respiration is 14.   Well-developed and well-nourished white female. Head and neck exam shows no ocular or oral lesions. She has no periorbital edema. There is no intraoral lesions. She no adenopathy in the neck. Thyroid is not double. Lungs are clear. Cardiac exam regular in rhythm with no murmurs rubs or bruits. Breast exam shows right breast no masses edema or erythema. There is no right axillary adenopathy. Left breast a side effect from radiation. She is a well-healed likely scar at about the 2:00 position. There is no mass in the left breast. There is no left axillary adenopathy. Abdomen is soft. His good bowel sounds. There is no fluid wave. No palpable  liver or spleen tip. Back exam shows no tenderness over the spine ribs or hips. Extremities shows no clubbing cyanosis or edema. She has the periungual wart on the medial aspect of the right thumb..  Lab Results  Component Value Date   WBC 4.7 12/02/2014   HGB 13.1 12/02/2014   HCT 39.0 12/02/2014   MCV 89 12/02/2014   PLT 190 12/02/2014     Chemistry      Component Value Date/Time   NA 141 12/02/2014 0939   NA 138 09/02/2014 0915   NA 139 12/03/2013 1319   K 4.3 12/02/2014 0939   K 4.1 09/02/2014 0915   K 3.6 12/03/2013 1319   CL 106 12/02/2014 0939   CL 103 12/03/2013 1319   CO2 26 12/02/2014 0939   CO2 25 09/02/2014 0915   CO2 31 12/03/2013 1319    BUN 15 12/02/2014 0939   BUN 9.0 09/02/2014 0915   BUN 14 12/03/2013 1319   CREATININE 0.57 12/02/2014 0939   CREATININE 0.6 09/02/2014 0915   CREATININE 0.9 12/03/2013 1319      Component Value Date/Time   CALCIUM 9.0 12/02/2014 0939   CALCIUM 8.9 09/02/2014 0915   CALCIUM 9.3 12/03/2013 1319   ALKPHOS 48 12/02/2014 0939   ALKPHOS 54 09/02/2014 0915   ALKPHOS 58 12/03/2013 1319   AST 20 12/02/2014 0939   AST 16 09/02/2014 0915   AST 23 12/03/2013 1319   ALT 14 12/02/2014 0939   ALT 13 09/02/2014 0915   ALT 14 12/03/2013 1319   BILITOT 0.5 12/02/2014 0939   BILITOT 0.67 09/02/2014 0915   BILITOT 0.60 12/03/2013 1319       Ferritin is 22 with an iron saturation of 15%..    Impression and Plan: Pamela Ray is 49 year old white female. She is a history of ductal carcinoma in situ of the left breast. She was diagnosed back in December of 2013. She underwent radiation. She is on Fareston We will keep her on 5 years of Fareston.  Her iron level is dropping so we will have to give her some IV iron. This was works for her.  Hopefully, this periungual wart will stay in remission. This is been incredibly painful for her but I'm glad to see that her is really no pain right now.  I spent about 30 minutes with her trying to help her out with all the stress that she is in. I reassured her and told her that I would pray for her.   I'll plan to see her back in another 3 months.   Nile Riggs, MD 4/15/20165:27 PM

## 2014-12-03 LAB — VITAMIN D 25 HYDROXY (VIT D DEFICIENCY, FRACTURES): VIT D 25 HYDROXY: 25 ng/mL — AB (ref 30–100)

## 2014-12-05 ENCOUNTER — Telehealth: Payer: Self-pay | Admitting: Hematology & Oncology

## 2014-12-05 ENCOUNTER — Telehealth: Payer: Self-pay | Admitting: *Deleted

## 2014-12-05 ENCOUNTER — Other Ambulatory Visit: Payer: Self-pay | Admitting: *Deleted

## 2014-12-05 DIAGNOSIS — M810 Age-related osteoporosis without current pathological fracture: Secondary | ICD-10-CM

## 2014-12-05 DIAGNOSIS — D509 Iron deficiency anemia, unspecified: Secondary | ICD-10-CM

## 2014-12-05 DIAGNOSIS — D0512 Intraductal carcinoma in situ of left breast: Secondary | ICD-10-CM

## 2014-12-05 MED ORDER — TOREMIFENE CITRATE 60 MG PO TABS: 60.0000 mg | ORAL_TABLET | Freq: Every day | ORAL | Status: DC

## 2014-12-05 MED ORDER — ERGOCALCIFEROL 1.25 MG (50000 UT) PO CAPS: 50000.0000 [IU] | ORAL_CAPSULE | ORAL | Status: DC

## 2014-12-05 NOTE — Telephone Encounter (Signed)
I called pt to schedule iron infusion she said she would call back to schedule

## 2014-12-05 NOTE — Telephone Encounter (Addendum)
Patient aware of results. She will call to schedule.   ----- Message from Volanda Napoleon, MD sent at 12/05/2014  7:18 AM EDT ----- Call - vit D is low!! How much is she taking??? Pete  Call - iron is actually low!!! Need feraheme 510 mg x 1 dose. Please set up!!  pete

## 2014-12-12 ENCOUNTER — Ambulatory Visit (HOSPITAL_BASED_OUTPATIENT_CLINIC_OR_DEPARTMENT_OTHER): Payer: 59

## 2014-12-12 VITALS — BP 108/64 | HR 70 | Temp 98.2°F | Resp 18

## 2014-12-12 DIAGNOSIS — D509 Iron deficiency anemia, unspecified: Secondary | ICD-10-CM | POA: Diagnosis not present

## 2014-12-12 MED ORDER — METHYLPREDNISOLONE SODIUM SUCC 125 MG IJ SOLR
125.0000 mg | Freq: Once | INTRAMUSCULAR | Status: AC
  Administered 2014-12-12: 125 mg via INTRAVENOUS

## 2014-12-12 MED ORDER — SODIUM CHLORIDE 0.9 % IV SOLN
INTRAVENOUS | Status: DC
  Administered 2014-12-12: 13:00:00 via INTRAVENOUS

## 2014-12-12 MED ORDER — METHYLPREDNISOLONE SODIUM SUCC 125 MG IJ SOLR
INTRAMUSCULAR | Status: AC
  Filled 2014-12-12: qty 2

## 2014-12-12 MED ORDER — FAMOTIDINE IN NACL 20-0.9 MG/50ML-% IV SOLN
20.0000 mg | Freq: Once | INTRAVENOUS | Status: AC
  Administered 2014-12-12: 20 mg via INTRAVENOUS

## 2014-12-12 MED ORDER — SODIUM CHLORIDE 0.9 % IV SOLN
510.0000 mg | Freq: Once | INTRAVENOUS | Status: AC
  Administered 2014-12-12: 510 mg via INTRAVENOUS
  Filled 2014-12-12: qty 17

## 2014-12-12 MED ORDER — FAMOTIDINE IN NACL 20-0.9 MG/50ML-% IV SOLN
INTRAVENOUS | Status: AC
  Filled 2014-12-12: qty 50

## 2014-12-12 NOTE — Patient Instructions (Signed)

## 2015-02-15 ENCOUNTER — Telehealth: Payer: Self-pay | Admitting: Hematology & Oncology

## 2015-02-15 NOTE — Telephone Encounter (Signed)
Pt called to reschedule due to conflict in schedule.

## 2015-03-03 ENCOUNTER — Other Ambulatory Visit: Payer: 59

## 2015-03-03 ENCOUNTER — Ambulatory Visit: Payer: 59 | Admitting: Hematology & Oncology

## 2015-03-13 ENCOUNTER — Encounter: Payer: Self-pay | Admitting: Family

## 2015-03-13 ENCOUNTER — Ambulatory Visit (HOSPITAL_BASED_OUTPATIENT_CLINIC_OR_DEPARTMENT_OTHER): Payer: 59 | Admitting: Family

## 2015-03-13 ENCOUNTER — Other Ambulatory Visit (HOSPITAL_BASED_OUTPATIENT_CLINIC_OR_DEPARTMENT_OTHER): Payer: 59

## 2015-03-13 VITALS — BP 114/64 | HR 75 | Temp 97.4°F | Resp 14 | Ht 66.0 in | Wt 139.0 lb

## 2015-03-13 DIAGNOSIS — D509 Iron deficiency anemia, unspecified: Secondary | ICD-10-CM

## 2015-03-13 DIAGNOSIS — D0512 Intraductal carcinoma in situ of left breast: Secondary | ICD-10-CM

## 2015-03-13 DIAGNOSIS — M810 Age-related osteoporosis without current pathological fracture: Secondary | ICD-10-CM

## 2015-03-13 LAB — CBC WITH DIFFERENTIAL (CANCER CENTER ONLY)
BASO#: 0 10*3/uL (ref 0.0–0.2)
BASO%: 0.4 % (ref 0.0–2.0)
EOS%: 1.8 % (ref 0.0–7.0)
Eosinophils Absolute: 0.1 10*3/uL (ref 0.0–0.5)
HCT: 40.1 % (ref 34.8–46.6)
HGB: 13.7 g/dL (ref 11.6–15.9)
LYMPH#: 1.6 10*3/uL (ref 0.9–3.3)
LYMPH%: 30.8 % (ref 14.0–48.0)
MCH: 30.2 pg (ref 26.0–34.0)
MCHC: 34.2 g/dL (ref 32.0–36.0)
MCV: 88 fL (ref 81–101)
MONO#: 0.5 10*3/uL (ref 0.1–0.9)
MONO%: 10.3 % (ref 0.0–13.0)
NEUT%: 56.7 % (ref 39.6–80.0)
NEUTROS ABS: 2.9 10*3/uL (ref 1.5–6.5)
Platelets: 188 10*3/uL (ref 145–400)
RBC: 4.54 10*6/uL (ref 3.70–5.32)
RDW: 13.9 % (ref 11.1–15.7)
WBC: 5 10*3/uL (ref 3.9–10.0)

## 2015-03-13 NOTE — Progress Notes (Signed)
Hematology and Oncology Follow Up Visit  Pamela Ray [id number] 26-Oct-1965 49 y.o. 03/13/2015   Principle Diagnosis:  DCIS of the left breast Recurrent iron deficiency anemia  Current Therapy:   IV iron with Feraheme as indicated - last dose in April 2016 Fareston 60mg  po q day - started May 2014    Interim History:  Pamela Ray is here today for a follow-up. She is still having pain in the left breast on some days. This is not a new issue for her and comes and goes. She is still feeling fatigued.  Her mammogram in December at [facility] was negative. She has fibrous/dense breasts.  She has had no changes with her chest. No mass, lesion or rashes. No lymphadenopathy found on assessment. She has done well on the [facility] and continues to take daily.  She denies fever, chills, n/v, cough, rash, dizziness, vision changes, SOB, chest pain, palpitations, abdominal pain, changes in bowel or bladder habits. No blood in urine or stool.  No swelling, tenderness, numbness or tingling in her extremities. She is eating healthy and staying hydrated. No significant weight loss or gain.  She is under some strain at home. Her ex-husband was recently diagnosed with prostate cancer.   Medications:    Medication List       This list is accurate as of: 03/13/15  1:54 PM.  Always use your most recent med list.               BOTOX 100 UNITS Solr injection  Generic drug:  botulinum toxin Type A  Every 3 months     desonide 0.05 % ointment  Commonly known as:  DESOWEN  Apply 1 application topically 2 (two) times daily.     docusate sodium 100 MG capsule  Commonly known as:  COLACE  Take 100 mg by mouth as needed for mild constipation.     ergocalciferol 50000 UNITS capsule  Commonly known as:  VITAMIN D2  Take 1 capsule (50,000 Units total) by mouth 2 (two) times a week.     fluticasone 0.005 % ointment  Commonly known as:  CUTIVATE  Apply 1 application topically as needed.     metaxalone  800 MG tablet  Commonly known as:  SKELAXIN  Take 800 mg by mouth 2 (two) times daily as needed. For migraines     PRESCRIPTION MEDICATION  Apply 1 application topically 2 (two) times daily as needed (Cortisone). Eczema cream     SUMAtriptan 100 MG tablet  Commonly known as:  IMITREX  Take 100 mg by mouth every 2 (two) hours as needed. Take 1 tablet at onset of migraine  may repeat in 2 hours  Limit use to 2 days a week     Toremifene Citrate 60 MG tablet  Commonly known as:  FARESTON  Take 1 tablet (60 mg total) by mouth daily.        Allergies:  Allergies  Allergen Reactions  . Cephalexin Itching  . Penicillins Hives    Past Medical History, Surgical history, Social history, and Family History were reviewed and updated.  Review of Systems: All other 10 point review of systems is negative.   Physical Exam:  height is 5\' 6"  (1.676 m) and weight is 139 lb (63.05 kg). Her oral temperature is 97.4 F (36.3 C). Her blood pressure is 114/64 and her pulse is 75. Her respiration is 14.   Wt Readings from Last 3 Encounters:  03/13/15 139 lb (63.05 kg)  12/02/14 142  lb (64.411 kg)  09/02/14 145 lb (65.772 kg)    Ocular: Sclerae unicteric, pupils equal, round and reactive to light Ear-nose-throat: Oropharynx clear, dentition fair Lymphatic: No cervical or supraclavicular adenopathy Lungs no rales or rhonchi, good excursion bilaterally Heart regular rate and rhythm, no murmur appreciated Abd soft, nontender, positive bowel sounds MSK no focal spinal tenderness, no joint edema Neuro: non-focal, well-oriented, appropriate affect Breasts: No changes. No mass, lesion, rash or lymphadenopathy on assessment.   Lab Results  Component Value Date   WBC 5.0 03/13/2015   HGB 13.7 03/13/2015   HCT 40.1 03/13/2015   MCV 88 03/13/2015   PLT 188 03/13/2015   Lab Results  Component Value Date   FERRITIN 22 12/02/2014   IRON 53 12/02/2014   TIBC 356 12/02/2014   UIBC 303 12/02/2014    IRONPCTSAT 15* 12/02/2014   Lab Results  Component Value Date   RETICCTPCT 1.1 04/01/2014   RBC 4.54 03/13/2015   RETICCTABS 50.7 04/01/2014   No results found for: KPAFRELGTCHN, LAMBDASER, KAPLAMBRATIO No results found for: IGGSERUM, IGA, IGMSERUM No results found for: Odetta Pink, SPEI   Chemistry      Component Value Date/Time   NA 141 12/02/2014 0939   NA 138 09/02/2014 0915   NA 139 12/03/2013 1319   K 4.3 12/02/2014 0939   K 4.1 09/02/2014 0915   K 3.6 12/03/2013 1319   CL 106 12/02/2014 0939   CL 103 12/03/2013 1319   CO2 26 12/02/2014 0939   CO2 25 09/02/2014 0915   CO2 31 12/03/2013 1319   BUN 15 12/02/2014 0939   BUN 9.0 09/02/2014 0915   BUN 14 12/03/2013 1319   CREATININE 0.57 12/02/2014 0939   CREATININE 0.6 09/02/2014 0915   CREATININE 0.9 12/03/2013 1319      Component Value Date/Time   CALCIUM 9.0 12/02/2014 0939   CALCIUM 8.9 09/02/2014 0915   CALCIUM 9.3 12/03/2013 1319   ALKPHOS 48 12/02/2014 0939   ALKPHOS 54 09/02/2014 0915   ALKPHOS 58 12/03/2013 1319   AST 20 12/02/2014 0939   AST 16 09/02/2014 0915   AST 23 12/03/2013 1319   ALT 14 12/02/2014 0939   ALT 13 09/02/2014 0915   ALT 14 12/03/2013 1319   BILITOT 0.5 12/02/2014 0939   BILITOT 0.67 09/02/2014 0915   BILITOT 0.60 12/03/2013 1319     Impression and Plan: Pamela Ray is 49 yo female with a history of 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 She underwent radiation and lumpectomy (margins negative). She has been on [place] since May 2014 with a goal of completing 5 years.  She is doing well but still having some pain in the left breast. This is not a new issues for her. This is likely due to radiation changes but we will continue to monitor. Her mammogram in December was negative for malignancy. She does have fibrous dense breasts.  Her CBC today looked good. We will see what her iron studies show.  We can bring her in later this week for Feraheme if needed.  We will plan to see her back in 3 months for labs and follow-up.  She knows to call here with any questions or concerns. We can certainly see her sooner if need be.   Eliezer Bottom, NP 7/25/20161:54 PM

## 2015-03-14 ENCOUNTER — Telehealth: Payer: Self-pay | Admitting: *Deleted

## 2015-03-14 DIAGNOSIS — D0512 Intraductal carcinoma in situ of left breast: Secondary | ICD-10-CM

## 2015-03-14 LAB — COMPREHENSIVE METABOLIC PANEL
ALK PHOS: 53 U/L (ref 33–115)
ALT: 15 U/L (ref 6–29)
AST: 19 U/L (ref 10–35)
Albumin: 4.2 g/dL (ref 3.6–5.1)
BUN: 13 mg/dL (ref 7–25)
CO2: 26 mmol/L (ref 20–31)
CREATININE: 0.66 mg/dL (ref 0.50–1.10)
Calcium: 9.2 mg/dL (ref 8.6–10.2)
Chloride: 101 mmol/L (ref 98–110)
GLUCOSE: 88 mg/dL (ref 65–99)
Potassium: 4.1 mmol/L (ref 3.5–5.3)
SODIUM: 137 mmol/L (ref 135–146)
TOTAL PROTEIN: 6.3 g/dL (ref 6.1–8.1)
Total Bilirubin: 0.7 mg/dL (ref 0.2–1.2)

## 2015-03-14 LAB — FERRITIN CHCC: Ferritin: 49 ng/ml (ref 9–269)

## 2015-03-14 LAB — IRON AND TIBC CHCC
%SAT: 35 % (ref 21–57)
Iron: 110 ug/dL (ref 41–142)
TIBC: 319 ug/dL (ref 236–444)
UIBC: 208 ug/dL (ref 120–384)

## 2015-03-14 LAB — VITAMIN D 25 HYDROXY (VIT D DEFICIENCY, FRACTURES): Vit D, 25-Hydroxy: 33 ng/mL (ref 30–100)

## 2015-03-14 MED ORDER — ERGOCALCIFEROL 1.25 MG (50000 UT) PO CAPS: 50000.0000 [IU] | ORAL_CAPSULE | ORAL | Status: DC

## 2015-03-14 NOTE — Telephone Encounter (Addendum)
Patient aware of results. New prescription sent for refill on vit D  ---- Message from Volanda Napoleon, MD sent at 03/14/2015  7:28 AM EDT ----- Call - Vit D level is ok!!!  Laurey Arrow

## 2015-03-20 ENCOUNTER — Ambulatory Visit (INDEPENDENT_AMBULATORY_CARE_PROVIDER_SITE_OTHER): Payer: 59 | Admitting: Psychology

## 2015-03-20 DIAGNOSIS — F332 Major depressive disorder, recurrent severe without psychotic features: Secondary | ICD-10-CM

## 2015-03-20 DIAGNOSIS — F411 Generalized anxiety disorder: Secondary | ICD-10-CM | POA: Diagnosis not present

## 2015-03-31 ENCOUNTER — Ambulatory Visit (INDEPENDENT_AMBULATORY_CARE_PROVIDER_SITE_OTHER): Payer: 59 | Admitting: Psychology

## 2015-03-31 DIAGNOSIS — F4321 Adjustment disorder with depressed mood: Secondary | ICD-10-CM

## 2015-04-06 ENCOUNTER — Ambulatory Visit (INDEPENDENT_AMBULATORY_CARE_PROVIDER_SITE_OTHER): Payer: 59 | Admitting: Psychology

## 2015-04-06 DIAGNOSIS — F4322 Adjustment disorder with anxiety: Secondary | ICD-10-CM | POA: Diagnosis not present

## 2015-04-19 ENCOUNTER — Ambulatory Visit (INDEPENDENT_AMBULATORY_CARE_PROVIDER_SITE_OTHER): Payer: 59 | Admitting: Psychology

## 2015-04-19 DIAGNOSIS — F332 Major depressive disorder, recurrent severe without psychotic features: Secondary | ICD-10-CM | POA: Diagnosis not present

## 2015-04-19 DIAGNOSIS — F411 Generalized anxiety disorder: Secondary | ICD-10-CM

## 2015-04-25 ENCOUNTER — Ambulatory Visit (INDEPENDENT_AMBULATORY_CARE_PROVIDER_SITE_OTHER): Payer: 59 | Admitting: Psychology

## 2015-04-25 DIAGNOSIS — F332 Major depressive disorder, recurrent severe without psychotic features: Secondary | ICD-10-CM | POA: Diagnosis not present

## 2015-04-25 DIAGNOSIS — F411 Generalized anxiety disorder: Secondary | ICD-10-CM

## 2015-05-01 ENCOUNTER — Ambulatory Visit (INDEPENDENT_AMBULATORY_CARE_PROVIDER_SITE_OTHER): Payer: 59 | Admitting: Psychology

## 2015-05-01 DIAGNOSIS — F411 Generalized anxiety disorder: Secondary | ICD-10-CM

## 2015-05-01 DIAGNOSIS — F332 Major depressive disorder, recurrent severe without psychotic features: Secondary | ICD-10-CM | POA: Diagnosis not present

## 2015-05-08 ENCOUNTER — Ambulatory Visit (INDEPENDENT_AMBULATORY_CARE_PROVIDER_SITE_OTHER): Payer: 59 | Admitting: Psychology

## 2015-05-08 DIAGNOSIS — F411 Generalized anxiety disorder: Secondary | ICD-10-CM

## 2015-05-08 DIAGNOSIS — F332 Major depressive disorder, recurrent severe without psychotic features: Secondary | ICD-10-CM

## 2015-05-11 ENCOUNTER — Other Ambulatory Visit: Payer: Self-pay | Admitting: Nurse Practitioner

## 2015-05-11 DIAGNOSIS — D509 Iron deficiency anemia, unspecified: Secondary | ICD-10-CM

## 2015-05-12 ENCOUNTER — Other Ambulatory Visit (HOSPITAL_BASED_OUTPATIENT_CLINIC_OR_DEPARTMENT_OTHER): Payer: 59

## 2015-05-12 DIAGNOSIS — D509 Iron deficiency anemia, unspecified: Secondary | ICD-10-CM | POA: Diagnosis not present

## 2015-05-12 LAB — RETICULOCYTES (CHCC)
ABS Retic: 48.9 10*3/uL (ref 19.0–186.0)
RBC.: 4.89 MIL/uL (ref 3.87–5.11)
Retic Ct Pct: 1 % (ref 0.4–2.3)

## 2015-05-12 LAB — CBC WITH DIFFERENTIAL (CANCER CENTER ONLY)
BASO#: 0 10*3/uL (ref 0.0–0.2)
BASO%: 0.4 % (ref 0.0–2.0)
EOS%: 2.7 % (ref 0.0–7.0)
Eosinophils Absolute: 0.1 10*3/uL (ref 0.0–0.5)
HCT: 41.9 % (ref 34.8–46.6)
HEMOGLOBIN: 14.2 g/dL (ref 11.6–15.9)
LYMPH#: 1.7 10*3/uL (ref 0.9–3.3)
LYMPH%: 32.2 % (ref 14.0–48.0)
MCH: 29.5 pg (ref 26.0–34.0)
MCHC: 33.9 g/dL (ref 32.0–36.0)
MCV: 87 fL (ref 81–101)
MONO#: 0.6 10*3/uL (ref 0.1–0.9)
MONO%: 11.8 % (ref 0.0–13.0)
NEUT%: 52.9 % (ref 39.6–80.0)
NEUTROS ABS: 2.8 10*3/uL (ref 1.5–6.5)
PLATELETS: 206 10*3/uL (ref 145–400)
RBC: 4.81 10*6/uL (ref 3.70–5.32)
RDW: 13 % (ref 11.1–15.7)
WBC: 5.2 10*3/uL (ref 3.9–10.0)

## 2015-05-15 ENCOUNTER — Other Ambulatory Visit: Payer: Self-pay | Admitting: Nurse Practitioner

## 2015-05-15 ENCOUNTER — Encounter: Payer: Self-pay | Admitting: Nurse Practitioner

## 2015-05-15 ENCOUNTER — Telehealth: Payer: Self-pay | Admitting: Family Medicine

## 2015-05-15 DIAGNOSIS — K645 Perianal venous thrombosis: Secondary | ICD-10-CM

## 2015-05-15 DIAGNOSIS — D509 Iron deficiency anemia, unspecified: Secondary | ICD-10-CM

## 2015-05-15 NOTE — Telephone Encounter (Signed)
Caller name: Lestine Relationship to patient:self Can be reached:[phone] Pharmacy:  Reason for call:She called [facility] as she has a thrombosed hemorroid.  They todl her they could get her in today if we send the referral   She spoke to the triage nurse emily.  Please put the referral in asap

## 2015-05-15 NOTE — Telephone Encounter (Signed)
This referral was placed, however pt has not been seen in 2 years. She needs an appt to continue care.

## 2015-05-16 ENCOUNTER — Ambulatory Visit: Payer: 59 | Admitting: Psychology

## 2015-05-18 ENCOUNTER — Ambulatory Visit (HOSPITAL_BASED_OUTPATIENT_CLINIC_OR_DEPARTMENT_OTHER): Payer: 59

## 2015-05-18 VITALS — BP 126/69 | HR 69 | Temp 98.2°F | Resp 18

## 2015-05-18 DIAGNOSIS — D509 Iron deficiency anemia, unspecified: Secondary | ICD-10-CM | POA: Diagnosis not present

## 2015-05-18 MED ORDER — FAMOTIDINE IN NACL 20-0.9 MG/50ML-% IV SOLN
INTRAVENOUS | Status: AC
  Filled 2015-05-18: qty 50

## 2015-05-18 MED ORDER — METHYLPREDNISOLONE SODIUM SUCC 125 MG IJ SOLR
INTRAMUSCULAR | Status: AC
  Filled 2015-05-18: qty 2

## 2015-05-18 MED ORDER — FAMOTIDINE IN NACL 20-0.9 MG/50ML-% IV SOLN
20.0000 mg | Freq: Once | INTRAVENOUS | Status: AC
  Administered 2015-05-18: 20 mg via INTRAVENOUS

## 2015-05-18 MED ORDER — SODIUM CHLORIDE 0.9 % IV SOLN
510.0000 mg | Freq: Once | INTRAVENOUS | Status: AC
  Administered 2015-05-18: 510 mg via INTRAVENOUS
  Filled 2015-05-18: qty 17

## 2015-05-18 MED ORDER — METHYLPREDNISOLONE SODIUM SUCC 125 MG IJ SOLR
125.0000 mg | Freq: Once | INTRAMUSCULAR | Status: AC
  Administered 2015-05-18: 125 mg via INTRAVENOUS

## 2015-05-19 ENCOUNTER — Encounter (HOSPITAL_BASED_OUTPATIENT_CLINIC_OR_DEPARTMENT_OTHER): Payer: Self-pay | Admitting: *Deleted

## 2015-05-19 ENCOUNTER — Emergency Department (HOSPITAL_BASED_OUTPATIENT_CLINIC_OR_DEPARTMENT_OTHER)
Admission: EM | Admit: 2015-05-19 | Discharge: 2015-05-19 | Disposition: A | Discharge status: Home / Self Care | Payer: 59 | Attending: Emergency Medicine | Admitting: Emergency Medicine

## 2015-05-19 ENCOUNTER — Emergency Department (HOSPITAL_BASED_OUTPATIENT_CLINIC_OR_DEPARTMENT_OTHER): Payer: 59

## 2015-05-19 ENCOUNTER — Other Ambulatory Visit: Payer: Self-pay

## 2015-05-19 DIAGNOSIS — G43909 Migraine, unspecified, not intractable, without status migrainosus: Secondary | ICD-10-CM | POA: Diagnosis not present

## 2015-05-19 DIAGNOSIS — Z87442 Personal history of urinary calculi: Secondary | ICD-10-CM | POA: Diagnosis not present

## 2015-05-19 DIAGNOSIS — R079 Chest pain, unspecified: Secondary | ICD-10-CM | POA: Insufficient documentation

## 2015-05-19 DIAGNOSIS — Z859 Personal history of malignant neoplasm, unspecified: Secondary | ICD-10-CM | POA: Insufficient documentation

## 2015-05-19 DIAGNOSIS — Z3202 Encounter for pregnancy test, result negative: Secondary | ICD-10-CM | POA: Insufficient documentation

## 2015-05-19 DIAGNOSIS — Z8619 Personal history of other infectious and parasitic diseases: Secondary | ICD-10-CM | POA: Diagnosis not present

## 2015-05-19 DIAGNOSIS — Z862 Personal history of diseases of the blood and blood-forming organs and certain disorders involving the immune mechanism: Secondary | ICD-10-CM | POA: Insufficient documentation

## 2015-05-19 DIAGNOSIS — Z79899 Other long term (current) drug therapy: Secondary | ICD-10-CM | POA: Insufficient documentation

## 2015-05-19 DIAGNOSIS — T50905A Adverse effect of unspecified drugs, medicaments and biological substances, initial encounter: Secondary | ICD-10-CM

## 2015-05-19 DIAGNOSIS — T454X5A Adverse effect of iron and its compounds, initial encounter: Secondary | ICD-10-CM | POA: Diagnosis not present

## 2015-05-19 DIAGNOSIS — Z880 Allergy status to penicillin: Secondary | ICD-10-CM | POA: Insufficient documentation

## 2015-05-19 DIAGNOSIS — Z8719 Personal history of other diseases of the digestive system: Secondary | ICD-10-CM | POA: Insufficient documentation

## 2015-05-19 HISTORY — DX: Malignant (primary) neoplasm, unspecified: C80.1

## 2015-05-19 LAB — CBC WITH DIFFERENTIAL/PLATELET
Basophils Absolute: 0 10*3/uL (ref 0.0–0.1)
Basophils Relative: 0 %
EOS ABS: 0 10*3/uL (ref 0.0–0.7)
EOS PCT: 0 %
HCT: 38.3 % (ref 36.0–46.0)
HEMOGLOBIN: 13 g/dL (ref 12.0–15.0)
LYMPHS ABS: 0.6 10*3/uL — AB (ref 0.7–4.0)
Lymphocytes Relative: 11 %
MCH: 29 pg (ref 26.0–34.0)
MCHC: 33.9 g/dL (ref 30.0–36.0)
MCV: 85.5 fL (ref 78.0–100.0)
MONO ABS: 0.2 10*3/uL (ref 0.1–1.0)
MONOS PCT: 3 %
Neutro Abs: 4.8 10*3/uL (ref 1.7–7.7)
Neutrophils Relative %: 86 %
Platelets: 212 10*3/uL (ref 150–400)
RBC: 4.48 MIL/uL (ref 3.87–5.11)
RDW: 12.9 % (ref 11.5–15.5)
WBC: 5.6 10*3/uL (ref 4.0–10.5)

## 2015-05-19 LAB — BASIC METABOLIC PANEL
Anion gap: 7 (ref 5–15)
BUN: 12 mg/dL (ref 6–20)
CHLORIDE: 108 mmol/L (ref 101–111)
CO2: 22 mmol/L (ref 22–32)
CREATININE: 0.68 mg/dL (ref 0.44–1.00)
Calcium: 9.5 mg/dL (ref 8.9–10.3)
GFR calc Af Amer: >60 mL/min (ref >60)
GFR calc non Af Amer: >60 mL/min (ref >60)
Glucose, Bld: 155 mg/dL — ABNORMAL HIGH (ref 65–99)
Potassium: 3.9 mmol/L (ref 3.5–5.1)
SODIUM: 137 mmol/L (ref 135–145)

## 2015-05-19 LAB — TROPONIN I

## 2015-05-19 LAB — PREGNANCY, URINE: PREG TEST UR: NEGATIVE

## 2015-05-19 MED ORDER — GI COCKTAIL ~~LOC~~
30.0000 mL | Freq: Once | ORAL | Status: AC
  Administered 2015-05-19: 30 mL via ORAL
  Filled 2015-05-19: qty 30

## 2015-05-19 MED ORDER — METHYLPREDNISOLONE SODIUM SUCC 125 MG IJ SOLR
125.0000 mg | Freq: Once | INTRAMUSCULAR | Status: AC
  Administered 2015-05-19: 125 mg via INTRAVENOUS
  Filled 2015-05-19: qty 2

## 2015-05-19 MED ORDER — DIPHENHYDRAMINE HCL 50 MG/ML IJ SOLN
25.0000 mg | Freq: Once | INTRAMUSCULAR | Status: AC
  Administered 2015-05-19: 25 mg via INTRAVENOUS
  Filled 2015-05-19: qty 1

## 2015-05-19 MED ORDER — IOHEXOL 350 MG/ML SOLN
75.0000 mL | Freq: Once | INTRAVENOUS | Status: AC | PRN
  Administered 2015-05-19: 100 mL via INTRAVENOUS

## 2015-05-19 MED ORDER — PREDNISONE 20 MG PO TABS: ORAL_TABLET | ORAL | Status: DC

## 2015-05-19 NOTE — ED Notes (Signed)
C/o mild left side cp onset last pm  Some sob,  No distress at present

## 2015-05-19 NOTE — Progress Notes (Signed)
1550- Patient states she doesn't feel well. Feraheme stopped and NS started. Patient states feeling chest tightness and "funny sensation" in her throat. VSS. Within a minute symptoms resolved.   1610 Feraheme restarted. Patient tolerated remaining infusion without complaints.

## 2015-05-19 NOTE — Patient Instructions (Signed)

## 2015-05-19 NOTE — ED Provider Notes (Signed)
CSN: [id number]     Arrival date & time 05/19/15  0221 History   First MD Initiated Contact with Patient 05/19/15 0250     Chief Complaint  Patient presents with  . Chest Pain     (Consider location/radiation/quality/duration/timing/severity/associated sxs/prior Treatment) Patient is a 49 y.o. female presenting with chest pain. The history is provided by the patient.  Chest Pain Pain location:  Substernal area Pain quality: dull   Pain radiates to:  Does not radiate Pain severity:  Moderate Onset quality:  Sudden Duration:  12 hours Timing:  Constant Progression:  Unchanged Chronicity:  New Context: not raising an arm   Context comment:  With iron infusion and subsequently Relieved by:  Nothing Worsened by:  Nothing tried Ineffective treatments:  None tried Associated symptoms: no back pain, no fever, no lower extremity edema, no palpitations, no shortness of breath, not vomiting and no weakness   Risk factors: no aortic disease and no diabetes mellitus     Past Medical History  Diagnosis Date  . Migraine   . Mitral valve prolapse     ? history  . IBS (irritable bowel syndrome)   . Kidney stone 12/12  . Chicken pox   . Seasonal allergies   . Anemia, iron deficiency 10/11/2011  . Migraine 10/11/2011  . Raynaud's phenomenon (by history or observed) 10/11/2011  . 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 Cancer    Past Surgical History  Procedure Laterality Date  . Tonsillectomy    . Hernia repair      double 1999  . Mastectomy, partial     Family History  Problem Relation Age of Onset  . Rheum arthritis Mother   . Rheum arthritis Maternal Grandmother   . Rheum arthritis Paternal Grandmother   . Heart disease      pgm,pgf,mgm,mgf  . Stroke Maternal Grandmother   . Stroke Maternal Grandfather   . Stroke Maternal Grandfather   . Stroke Paternal Grandmother   . Stroke Paternal Grandfather   . Hypertension Maternal Grandmother   . Hypertension Maternal  Grandfather   . Hypertension Paternal Grandmother   . Hypertension Paternal Grandfather   . Diabetes Maternal Grandmother   . Diabetes Maternal Grandfather   . Diabetes Paternal Grandmother   . Diabetes Paternal Grandfather    Social History  Substance Use Topics  . Smoking status: Never Smoker   . Smokeless tobacco: Never Used     Comment: never used tobacco  . Alcohol Use: No   OB History    No data available     Review of Systems  Constitutional: Negative for fever.  Respiratory: Negative for shortness of breath.   Cardiovascular: Positive for chest pain. Negative for palpitations and leg swelling.  Gastrointestinal: Negative for vomiting.  Musculoskeletal: Negative for back pain.  Neurological: Negative for weakness.  All other systems reviewed and are negative.     Allergies  Cephalexin and Penicillins  Home Medications   Prior to Admission medications   Medication Sig Start Date End Date Taking? Authorizing Provider  BOTOX 100 UNITS SOLR Every 3 months 05/20/12   Historical Provider, MD  desonide (DESOWEN) 0.05 % ointment Apply 1 application topically 2 (two) times daily.  05/12/13   Historical Provider, MD  docusate sodium (COLACE) 100 MG capsule Take 100 mg by mouth as needed for mild constipation.    Historical Provider, MD  ergocalciferol (VITAMIN D2) 50000 UNITS capsule Take 1 capsule (50,000 Units total) by mouth 2 (two) times a  week. 03/14/15   Volanda Napoleon, MD  fluticasone (CUTIVATE) 0.005 % ointment Apply 1 application topically as needed.  01/30/14   Historical Provider, MD  metaxalone (SKELAXIN) 800 MG tablet Take 800 mg by mouth 2 (two) times daily as needed. For migraines    Historical Provider, MD  PRESCRIPTION MEDICATION Apply 1 application topically 2 (two) times daily as needed (Cortisone). Eczema cream    Historical Provider, MD  SUMAtriptan (IMITREX) 100 MG tablet Take 100 mg by mouth every 2 (two) hours as needed. Take 1 tablet at onset of  migraine  may repeat in 2 hours  Limit use to 2 days a week     Historical Provider, MD  Toremifene Citrate (FARESTON) 60 MG tablet Take 1 tablet (60 mg total) by mouth daily. 12/05/14   Volanda Napoleon, MD   BP 122/66 mmHg  Pulse 70  Temp(Src) 98 F (36.7 C) (Oral)  Resp 16  Ht 5\' 6"  (1.676 m)  Wt 140 lb (63.504 kg)  BMI 22.61 kg/m2  SpO2 100%  LMP 04/21/2015 (Exact Date) Physical Exam  Constitutional: She is oriented to person, place, and time. She appears well-developed and well-nourished. No distress.  HENT:  Head: Normocephalic and atraumatic.  Mouth/Throat: Oropharynx is clear and moist.  No swelling of the lips tongue or uvula  Eyes: Conjunctivae are normal. Pupils are equal, round, and reactive to light.  Neck: Normal range of motion. Neck supple.  Cardiovascular: Normal rate, regular rhythm and intact distal pulses.   Pulmonary/Chest: Effort normal and breath sounds normal. No respiratory distress. She has no wheezes. She has no rales. She exhibits no tenderness.  Abdominal: Soft. Bowel sounds are increased. There is no tenderness. There is no rebound and no guarding.  Musculoskeletal: Normal range of motion. She exhibits no edema or tenderness.  Neurological: She is alert and oriented to person, place, and time. She has normal reflexes.  Skin: Skin is warm and dry. No rash noted.    ED Course  Procedures (including critical care time) Labs Review Labs Reviewed - No data to display  Imaging Review No results found. I have personally reviewed and evaluated these images and lab results as part of my medical decision-making.   EKG Interpretation   Date/Time:  Friday May 19 2015 02:34:02 EDT Ventricular Rate:  67 PR Interval:  114 QRS Duration: 76 QT Interval:  394 QTC Calculation: 416 R Axis:   78 Text Interpretation:  Normal sinus rhythm Confirmed by [facility]  MD,  APRIL ([id number]) on 05/19/2015 2:49:12 AM      MDM   Final diagnoses:  None     Results for orders placed or performed during the hospital encounter of 05/19/15  CBC with Differential/Platelet  Result Value Ref Range   WBC 5.6 4.0 - 10.5 K/uL   RBC 4.48 3.87 - 5.11 MIL/uL   Hemoglobin 13.0 12.0 - 15.0 g/dL   HCT 38.3 36.0 - 46.0 %   MCV 85.5 78.0 - 100.0 fL   MCH 29.0 26.0 - 34.0 pg   MCHC 33.9 30.0 - 36.0 g/dL   RDW 12.9 11.5 - 15.5 %   Platelets 212 150 - 400 K/uL   Neutrophils Relative % 86 %   Neutro Abs 4.8 1.7 - 7.7 K/uL   Lymphocytes Relative 11 %   Lymphs Abs 0.6 (L) 0.7 - 4.0 K/uL   Monocytes Relative 3 %   Monocytes Absolute 0.2 0.1 - 1.0 K/uL   Eosinophils Relative 0 %  Eosinophils Absolute 0.0 0.0 - 0.7 K/uL   Basophils Relative 0 %   Basophils Absolute 0.0 0.0 - 0.1 K/uL  Basic metabolic panel  Result Value Ref Range   Sodium 137 135 - 145 mmol/L   Potassium 3.9 3.5 - 5.1 mmol/L   Chloride 108 101 - 111 mmol/L   CO2 22 22 - 32 mmol/L   Glucose, Bld 155 (H) 65 - 99 mg/dL   BUN 12 6 - 20 mg/dL   Creatinine, Ser 0.68 0.44 - 1.00 mg/dL   Calcium 9.5 8.9 - 10.3 mg/dL   GFR calc non Af Amer >60 >60 mL/min   GFR calc Af Amer >60 >60 mL/min   Anion gap 7 5 - 15  Troponin I  Result Value Ref Range   Troponin I <0.03 <0.031 ng/mL  Pregnancy, urine  Result Value Ref Range   Preg Test, Ur NEGATIVE NEGATIVE   Dg Chest 2 View  05/19/2015   CLINICAL DATA:  Chest tightness with dizziness.  EXAM: CHEST  2 VIEW  COMPARISON:  12/29/2009  FINDINGS: Normal heart size and mediastinal contours. No acute infiltrate or edema. Stable biapical pleural based scarring. No effusion or pneumothorax. No acute osseous findings.  IMPRESSION: No active cardiopulmonary disease.   Electronically Signed   By: Monte Fantasia M.D.   On: 05/19/2015 03:13    Heart score 0, with negative ekg and troponin and ongoing symptoms ACS is excluded.  I suspect this is a medication reaction to the iron infusion as that is when it started but given patient's personal h/o of CA PE  was excluded.  Will start steroids and have patient follow up with PMD for recheck.  Strict return precautions given   April Palumbo, MD 05/19/15 [phone]

## 2015-05-19 NOTE — ED Notes (Signed)
Left sided Chest pressure intermittently since 10pm.  Dizziness since 4pm. Pt sts she had an iron infusion yesterday at Dr. Kris Hartmann office and had these same sx during the infusion.  They had pretreated her with Pepcid.  Pt sts it is "not like an elephant is sitting on my chest... More like a cat." Nontender.  No radiation.

## 2015-05-31 ENCOUNTER — Ambulatory Visit: Payer: 59 | Admitting: Psychology

## 2015-06-02 ENCOUNTER — Ambulatory Visit: Payer: 59 | Admitting: Psychology

## 2015-06-02 ENCOUNTER — Ambulatory Visit (INDEPENDENT_AMBULATORY_CARE_PROVIDER_SITE_OTHER): Payer: 59 | Admitting: Psychology

## 2015-06-02 DIAGNOSIS — F332 Major depressive disorder, recurrent severe without psychotic features: Secondary | ICD-10-CM | POA: Diagnosis not present

## 2015-06-02 DIAGNOSIS — F411 Generalized anxiety disorder: Secondary | ICD-10-CM

## 2015-06-05 ENCOUNTER — Other Ambulatory Visit: Payer: Self-pay | Admitting: *Deleted

## 2015-06-05 DIAGNOSIS — M810 Age-related osteoporosis without current pathological fracture: Secondary | ICD-10-CM

## 2015-06-05 DIAGNOSIS — D0512 Intraductal carcinoma in situ of left breast: Secondary | ICD-10-CM

## 2015-06-05 DIAGNOSIS — D509 Iron deficiency anemia, unspecified: Secondary | ICD-10-CM

## 2015-06-05 MED ORDER — TOREMIFENE CITRATE 60 MG PO TABS: 60.0000 mg | ORAL_TABLET | Freq: Every day | ORAL | Status: DC

## 2015-06-05 NOTE — Telephone Encounter (Signed)
Patient is switching jobs therefore switching insurance and wanted to get Rx for Fareston in case there was a delay with her new insurance.  Dr. Marin Olp ok with this.

## 2015-06-08 ENCOUNTER — Ambulatory Visit: Payer: 59 | Admitting: Psychology

## 2015-06-13 ENCOUNTER — Ambulatory Visit (HOSPITAL_BASED_OUTPATIENT_CLINIC_OR_DEPARTMENT_OTHER): Payer: 59 | Admitting: Family

## 2015-06-13 ENCOUNTER — Other Ambulatory Visit (HOSPITAL_BASED_OUTPATIENT_CLINIC_OR_DEPARTMENT_OTHER): Payer: 59

## 2015-06-13 ENCOUNTER — Other Ambulatory Visit: Payer: 59

## 2015-06-13 ENCOUNTER — Encounter: Payer: Self-pay | Admitting: Family

## 2015-06-13 VITALS — BP 106/65 | HR 84 | Temp 98.1°F | Resp 16 | Wt 143.0 lb

## 2015-06-13 DIAGNOSIS — Z86000 Personal history of in-situ neoplasm of breast: Secondary | ICD-10-CM | POA: Diagnosis not present

## 2015-06-13 DIAGNOSIS — D0512 Intraductal carcinoma in situ of left breast: Secondary | ICD-10-CM

## 2015-06-13 DIAGNOSIS — D509 Iron deficiency anemia, unspecified: Secondary | ICD-10-CM

## 2015-06-13 LAB — CBC WITH DIFFERENTIAL (CANCER CENTER ONLY)
BASO#: 0 10*3/uL (ref 0.0–0.2)
BASO%: 0.5 % (ref 0.0–2.0)
EOS ABS: 0.1 10*3/uL (ref 0.0–0.5)
EOS%: 3.8 % (ref 0.0–7.0)
HCT: 40.5 % (ref 34.8–46.6)
HEMOGLOBIN: 13.9 g/dL (ref 11.6–15.9)
LYMPH#: 1.2 10*3/uL (ref 0.9–3.3)
LYMPH%: 33.2 % (ref 14.0–48.0)
MCH: 29.7 pg (ref 26.0–34.0)
MCHC: 34.3 g/dL (ref 32.0–36.0)
MCV: 87 fL (ref 81–101)
MONO#: 0.4 10*3/uL (ref 0.1–0.9)
MONO%: 10.1 % (ref 0.0–13.0)
NEUT%: 52.4 % (ref 39.6–80.0)
NEUTROS ABS: 1.9 10*3/uL (ref 1.5–6.5)
PLATELETS: 194 10*3/uL (ref 145–400)
RBC: 4.68 10*6/uL (ref 3.70–5.32)
RDW: 14 % (ref 11.1–15.7)
WBC: 3.7 10*3/uL — AB (ref 3.9–10.0)

## 2015-06-13 LAB — CHCC SATELLITE - SMEAR

## 2015-06-13 LAB — COMPREHENSIVE METABOLIC PANEL (CC13)
ALBUMIN: 3.5 g/dL (ref 3.5–5.0)
ALK PHOS: 56 U/L (ref 40–150)
ALT: 11 U/L (ref 0–55)
AST: 15 U/L (ref 5–34)
Anion Gap: 7 mEq/L (ref 3–11)
BUN: 15.3 mg/dL (ref 7.0–26.0)
CALCIUM: 9.3 mg/dL (ref 8.4–10.4)
CHLORIDE: 111 meq/L — AB (ref 98–109)
CO2: 19 mEq/L — ABNORMAL LOW (ref 22–29)
Creatinine: 0.7 mg/dL (ref 0.6–1.1)
Glucose: 139 mg/dl (ref 70–140)
POTASSIUM: 4.1 meq/L (ref 3.5–5.1)
Sodium: 138 mEq/L (ref 136–145)
Total Bilirubin: 0.49 mg/dL (ref 0.20–1.20)
Total Protein: 6.2 g/dL — ABNORMAL LOW (ref 6.4–8.3)

## 2015-06-13 LAB — FOLLICLE STIMULATING HORMONE: FSH: 10.8 m[IU]/mL

## 2015-06-13 LAB — LUTEINIZING HORMONE: LH: 7.3 m[IU]/mL

## 2015-06-13 LAB — IRON AND TIBC CHCC
%SAT: 29 % (ref 21–57)
IRON: 89 ug/dL (ref 41–142)
TIBC: 303 ug/dL (ref 236–444)
UIBC: 214 ug/dL (ref 120–384)

## 2015-06-13 LAB — FERRITIN CHCC: FERRITIN: 185 ng/mL (ref 9–269)

## 2015-06-13 NOTE — Progress Notes (Signed)
Hematology and Oncology Follow Up Visit  Pamela Ray [id number] 1966-01-10 49 y.o. 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 She Ray doing well now. She had a reaction after receiving iron in September and went to the ED with chest pain. It was thought to be caused by the fillers. She has had this happen once before.    She has had no other episodes of chest pain. She Ray scheduled to have her annual mammogram in December. No changes identified on today's breast exam. She still has occasional pain in her left breast but noticed that it was worse if she wasn't drinking enough fluids. She Ray now making sure she stays well hydrated and has noticed and reduction in those episodes.  No lymphadenopathy found on exam.  No fever, chills, n/v, cough, rash, dizziness, vision changes, SOB,  palpitations, abdominal pain or changes in bowel or bladder habits. No swelling, tenderness, numbness or tingling in her extremities. She Ray eating healthy and staying hydrated. No significant weight loss or gain.  She Ray under some strain at home. Her ex-husband was recently diagnosed with prostate cancer.   Medications:    Medication List       This list Ray accurate as of: 06/13/15  9:19 AM.  Always use your most recent med list.               BOTOX 100 UNITS Solr injection  Generic drug:  botulinum toxin Type A  Every 3 months     desonide 0.05 % ointment  Commonly known as:  DESOWEN  Apply 1 application topically 2 (two) times daily.     docusate sodium 100 MG capsule  Commonly known as:  COLACE  Take 100 mg by mouth as needed for mild constipation.     ergocalciferol 50000 UNITS capsule  Commonly known as:  VITAMIN D2  Take 1  capsule (50,000 Units total) by mouth 2 (two) times a week.     metaxalone 800 MG tablet  Commonly known as:  SKELAXIN  Take 800 mg by mouth 2 (two) times daily as needed. For migraines     PRESCRIPTION MEDICATION  Apply 1 application topically 2 (two) times daily as needed (Cortisone). Eczema cream     SUMAtriptan 100 MG tablet  Commonly known as:  IMITREX  Take 100 mg by mouth every 2 (two) hours as needed. Take 1 tablet at onset of migraine  may repeat in 2 hours  Limit use to 2 days a week     Toremifene Citrate 60 MG tablet  Commonly known as:  FARESTON  Take 1 tablet (60 mg total) by mouth daily.        Allergies:  Allergies  Allergen Reactions  . Cephalexin Itching  . Penicillins Hives    Past Medical History, Surgical history, Social history, and Family History were reviewed and updated.  Review of Systems: All other 10 point review of systems Ray negative.   Physical Exam:  weight Ray 143 lb (64.864 kg). Her oral temperature Ray 98.1 F (36.7 C). Her blood pressure Ray 106/65 and her pulse Ray 84. Her respiration Ray 16.   Wt Readings from Last 3 Encounters:  06/13/15  143 lb (64.864 kg)  05/19/15 140 lb (63.504 kg)  03/13/15 139 lb (63.05 kg)    Ocular: Sclerae unicteric, pupils equal, round and reactive to light Ear-nose-throat: Oropharynx clear, dentition fair Lymphatic: No cervical or supraclavicular adenopathy Lungs no rales or rhonchi, good excursion bilaterally Heart regular rate and rhythm, no murmur appreciated Abd soft, nontender, positive bowel sounds MSK no focal spinal tenderness, no joint edema Neuro: non-focal, well-oriented, appropriate affect Breasts: No changes. No mass, lesion, rash or lymphadenopathy on assessment.   Lab Results  Component Value Date   WBC 3.7* 06/13/2015   HGB 13.9 06/13/2015   HCT 40.5 06/13/2015   MCV 87 06/13/2015   PLT 194 06/13/2015   Lab Results  Component Value Date   FERRITIN 49 03/13/2015   IRON 110  03/13/2015   TIBC 319 03/13/2015   UIBC 208 03/13/2015   IRONPCTSAT 35 03/13/2015   Lab Results  Component Value Date   RETICCTPCT 1.0 05/12/2015   RBC 4.68 06/13/2015   RETICCTABS 48.9 05/12/2015   No results found for: KPAFRELGTCHN, LAMBDASER, KAPLAMBRATIO No results found for: IGGSERUM, IGA, IGMSERUM No results found for: Odetta Pink, SPEI   Chemistry      Component Value Date/Time   NA 137 05/19/2015 0250   NA 138 09/02/2014 0915   NA 139 12/03/2013 1319   K 3.9 05/19/2015 0250   K 4.1 09/02/2014 0915   K 3.6 12/03/2013 1319   CL 108 05/19/2015 0250   CL 103 12/03/2013 1319   CO2 22 05/19/2015 0250   CO2 25 09/02/2014 0915   CO2 31 12/03/2013 1319   BUN 12 05/19/2015 0250   BUN 9.0 09/02/2014 0915   BUN 14 12/03/2013 1319   CREATININE 0.68 05/19/2015 0250   CREATININE 0.6 09/02/2014 0915   CREATININE 0.9 12/03/2013 1319      Component Value Date/Time   CALCIUM 9.5 05/19/2015 0250   CALCIUM 8.9 09/02/2014 0915   CALCIUM 9.3 12/03/2013 1319   ALKPHOS 53 03/13/2015 1338   ALKPHOS 54 09/02/2014 0915   ALKPHOS 58 12/03/2013 1319   AST 19 03/13/2015 1338   AST 16 09/02/2014 0915   AST 23 12/03/2013 1319   ALT 15 03/13/2015 1338   ALT 13 09/02/2014 0915   ALT 14 12/03/2013 1319   BILITOT 0.7 03/13/2015 1338   BILITOT 0.67 09/02/2014 0915   BILITOT 0.60 12/03/2013 1319     Impression and Plan: Pamela Ray Ray 49 yo female with a history of 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 She underwent radiation and lumpectomy (margins negative). She has been on [place] since May 2014 with a goal of completing 5 years.  She has done well and so far there has been no evidence of recurrence.   She will have her annual Mammogram in December.  She reacted with her last dose of feraheme with chest pain. This resolved after a day or so. She did go to the ED and was treated with pepcid. She has had  no more episodes of chest pain.  Her CBC today looks good. We will see what her iron studies show. I do not think she needs Feraheme at this time. We will plan to see her back in 3 months for labs and follow-up.  She will contact us with any questions or concerns. We can certainly see her sooner if need be.   Eliezer Bottom, NP 10/25/20169:19 AM

## 2015-06-15 ENCOUNTER — Ambulatory Visit (INDEPENDENT_AMBULATORY_CARE_PROVIDER_SITE_OTHER): Payer: 59 | Admitting: Psychology

## 2015-06-15 DIAGNOSIS — F332 Major depressive disorder, recurrent severe without psychotic features: Secondary | ICD-10-CM | POA: Diagnosis not present

## 2015-06-15 DIAGNOSIS — F411 Generalized anxiety disorder: Secondary | ICD-10-CM | POA: Diagnosis not present

## 2015-06-16 ENCOUNTER — Ambulatory Visit: Payer: 59 | Admitting: Hematology & Oncology

## 2015-06-16 ENCOUNTER — Other Ambulatory Visit: Payer: 59

## 2015-06-16 ENCOUNTER — Ambulatory Visit: Payer: 59 | Admitting: Family

## 2015-06-17 LAB — ESTRADIOL, ULTRA SENS: ESTRADIOL, ULTRA SENSITIVE: 123 pg/mL

## 2015-06-22 ENCOUNTER — Encounter: Payer: Self-pay | Admitting: Hematology & Oncology

## 2015-07-28 ENCOUNTER — Ambulatory Visit (INDEPENDENT_AMBULATORY_CARE_PROVIDER_SITE_OTHER): Payer: 59 | Admitting: Psychology

## 2015-07-28 DIAGNOSIS — F411 Generalized anxiety disorder: Secondary | ICD-10-CM | POA: Diagnosis not present

## 2015-07-28 DIAGNOSIS — F332 Major depressive disorder, recurrent severe without psychotic features: Secondary | ICD-10-CM | POA: Diagnosis not present

## 2015-08-03 ENCOUNTER — Ambulatory Visit (INDEPENDENT_AMBULATORY_CARE_PROVIDER_SITE_OTHER): Payer: 59 | Admitting: Psychology

## 2015-08-03 DIAGNOSIS — F411 Generalized anxiety disorder: Secondary | ICD-10-CM

## 2015-08-03 DIAGNOSIS — F332 Major depressive disorder, recurrent severe without psychotic features: Secondary | ICD-10-CM

## 2015-08-09 ENCOUNTER — Ambulatory Visit (INDEPENDENT_AMBULATORY_CARE_PROVIDER_SITE_OTHER): Payer: 59 | Admitting: Psychology

## 2015-08-09 DIAGNOSIS — F332 Major depressive disorder, recurrent severe without psychotic features: Secondary | ICD-10-CM | POA: Diagnosis not present

## 2015-08-09 DIAGNOSIS — F411 Generalized anxiety disorder: Secondary | ICD-10-CM

## 2015-08-10 ENCOUNTER — Ambulatory Visit: Payer: 59 | Admitting: Psychology

## 2015-08-15 ENCOUNTER — Ambulatory Visit (INDEPENDENT_AMBULATORY_CARE_PROVIDER_SITE_OTHER): Payer: 59 | Admitting: Psychology

## 2015-08-15 DIAGNOSIS — F411 Generalized anxiety disorder: Secondary | ICD-10-CM

## 2015-08-15 DIAGNOSIS — F332 Major depressive disorder, recurrent severe without psychotic features: Secondary | ICD-10-CM

## 2015-08-23 ENCOUNTER — Ambulatory Visit (INDEPENDENT_AMBULATORY_CARE_PROVIDER_SITE_OTHER): Payer: 59 | Admitting: Psychology

## 2015-08-23 DIAGNOSIS — F411 Generalized anxiety disorder: Secondary | ICD-10-CM | POA: Diagnosis not present

## 2015-08-23 DIAGNOSIS — F332 Major depressive disorder, recurrent severe without psychotic features: Secondary | ICD-10-CM | POA: Diagnosis not present

## 2015-08-24 ENCOUNTER — Ambulatory Visit (INDEPENDENT_AMBULATORY_CARE_PROVIDER_SITE_OTHER): Payer: 59 | Admitting: Psychology

## 2015-08-24 DIAGNOSIS — F411 Generalized anxiety disorder: Secondary | ICD-10-CM | POA: Diagnosis not present

## 2015-08-24 DIAGNOSIS — F332 Major depressive disorder, recurrent severe without psychotic features: Secondary | ICD-10-CM | POA: Diagnosis not present

## 2015-09-08 ENCOUNTER — Ambulatory Visit (INDEPENDENT_AMBULATORY_CARE_PROVIDER_SITE_OTHER): Payer: 59 | Admitting: Psychology

## 2015-09-08 DIAGNOSIS — F411 Generalized anxiety disorder: Secondary | ICD-10-CM | POA: Diagnosis not present

## 2015-09-08 DIAGNOSIS — F332 Major depressive disorder, recurrent severe without psychotic features: Secondary | ICD-10-CM

## 2015-09-13 ENCOUNTER — Ambulatory Visit: Payer: 59 | Admitting: Psychology

## 2015-09-15 ENCOUNTER — Ambulatory Visit (INDEPENDENT_AMBULATORY_CARE_PROVIDER_SITE_OTHER): Payer: 59 | Admitting: Psychology

## 2015-09-15 ENCOUNTER — Encounter: Payer: Self-pay | Admitting: Hematology & Oncology

## 2015-09-15 ENCOUNTER — Other Ambulatory Visit (HOSPITAL_BASED_OUTPATIENT_CLINIC_OR_DEPARTMENT_OTHER): Payer: 59

## 2015-09-15 ENCOUNTER — Ambulatory Visit (HOSPITAL_BASED_OUTPATIENT_CLINIC_OR_DEPARTMENT_OTHER): Payer: 59 | Admitting: Hematology & Oncology

## 2015-09-15 VITALS — BP 108/77 | HR 84 | Temp 97.6°F | Resp 16 | Ht 66.0 in | Wt 141.0 lb

## 2015-09-15 DIAGNOSIS — D509 Iron deficiency anemia, unspecified: Secondary | ICD-10-CM | POA: Diagnosis not present

## 2015-09-15 DIAGNOSIS — F332 Major depressive disorder, recurrent severe without psychotic features: Secondary | ICD-10-CM

## 2015-09-15 DIAGNOSIS — E7801 Familial hypercholesterolemia: Secondary | ICD-10-CM

## 2015-09-15 DIAGNOSIS — D0512 Intraductal carcinoma in situ of left breast: Secondary | ICD-10-CM

## 2015-09-15 DIAGNOSIS — E559 Vitamin D deficiency, unspecified: Secondary | ICD-10-CM

## 2015-09-15 DIAGNOSIS — M810 Age-related osteoporosis without current pathological fracture: Secondary | ICD-10-CM

## 2015-09-15 DIAGNOSIS — R739 Hyperglycemia, unspecified: Secondary | ICD-10-CM

## 2015-09-15 DIAGNOSIS — F411 Generalized anxiety disorder: Secondary | ICD-10-CM

## 2015-09-15 LAB — COMPREHENSIVE METABOLIC PANEL
ALK PHOS: 51 U/L (ref 40–150)
ALT: 11 U/L (ref 0–55)
ANION GAP: 6 meq/L (ref 3–11)
AST: 17 U/L (ref 5–34)
Albumin: 3.5 g/dL (ref 3.5–5.0)
BUN: 13.6 mg/dL (ref 7.0–26.0)
CALCIUM: 8.8 mg/dL (ref 8.4–10.4)
CHLORIDE: 109 meq/L (ref 98–109)
CO2: 24 mEq/L (ref 22–29)
Creatinine: 0.7 mg/dL (ref 0.6–1.1)
Glucose: 97 mg/dl (ref 70–140)
POTASSIUM: 4.1 meq/L (ref 3.5–5.1)
Sodium: 139 mEq/L (ref 136–145)
Total Bilirubin: 0.63 mg/dL (ref 0.20–1.20)
Total Protein: 6.1 g/dL — ABNORMAL LOW (ref 6.4–8.3)

## 2015-09-15 LAB — CBC WITH DIFFERENTIAL (CANCER CENTER ONLY)
BASO#: 0 10*3/uL (ref 0.0–0.2)
BASO%: 0.8 % (ref 0.0–2.0)
EOS ABS: 0.2 10*3/uL (ref 0.0–0.5)
EOS%: 5.1 % (ref 0.0–7.0)
HEMATOCRIT: 39.8 % (ref 34.8–46.6)
HGB: 13.6 g/dL (ref 11.6–15.9)
LYMPH#: 1.1 10*3/uL (ref 0.9–3.3)
LYMPH%: 30.6 % (ref 14.0–48.0)
MCH: 29.6 pg (ref 26.0–34.0)
MCHC: 34.2 g/dL (ref 32.0–36.0)
MCV: 87 fL (ref 81–101)
MONO#: 0.4 10*3/uL (ref 0.1–0.9)
MONO%: 12.4 % (ref 0.0–13.0)
NEUT#: 1.8 10*3/uL (ref 1.5–6.5)
NEUT%: 51.1 % (ref 39.6–80.0)
PLATELETS: 179 10*3/uL (ref 145–400)
RBC: 4.59 10*6/uL (ref 3.70–5.32)
RDW: 13.4 % (ref 11.1–15.7)
WBC: 3.6 10*3/uL — ABNORMAL LOW (ref 3.9–10.0)

## 2015-09-15 LAB — IRON AND TIBC
%SAT: 25 % (ref 21–57)
IRON: 79 ug/dL (ref 41–142)
TIBC: 315 ug/dL (ref 236–444)
UIBC: 236 ug/dL (ref 120–384)

## 2015-09-15 LAB — FERRITIN: FERRITIN: 38 ng/mL (ref 9–269)

## 2015-09-15 MED ORDER — ERGOCALCIFEROL 1.25 MG (50000 UT) PO CAPS: 50000.0000 [IU] | ORAL_CAPSULE | ORAL | Status: DC

## 2015-09-15 MED ORDER — TOREMIFENE CITRATE 60 MG PO TABS: 60.0000 mg | ORAL_TABLET | Freq: Every day | ORAL | Status: DC

## 2015-09-15 NOTE — Progress Notes (Signed)
Hematology and Oncology Follow Up Visit  Pamela Ray [id number] 1966/04/19 50 y.o. 09/15/2015   Principle Diagnosis:   DCIS of the left breast  Recurrent iron deficiency anemia  Current Therapy:    IV iron with Feraheme as indicated  Fareston 60mg  po q day     Interim History:  Ms.  Ray is back for followup. She is having a lot of personal difficulties right now. She is holding up as well as can be expected.  She has a new job which is a good thing for Pamela. She will actually works for [employer]. Thankfully, she will not have to move.  Pamela Ray are doing well in college.  She is lost a little bit of weight. She is exercising.  She had a  mammogram done at [facility] on January 17. This turned out okay.  Unfortunately, the fingernail on the right thumb is having problems with recurrence of this tumor. It sounds like nothing else can be done. She does see dermatology for this.  She's had no rashes. She's had no change in bowel or bladder habits.  She last received iron back in the summertime.  She still has Pamela monthly cycles.  Medications:  Current outpatient prescriptions:  .  BOTOX 100 UNITS SOLR, Every 3 months, Disp: , Rfl:  .  desonide (DESOWEN) 0.05 % ointment, Apply 1 application topically 2 (two) times daily. , Disp: , Rfl:  .  docusate sodium (COLACE) 100 MG capsule, Take 100 mg by mouth as needed for mild constipation., Disp: , Rfl:  .  ergocalciferol (VITAMIN D2) 50000 units capsule, Take 1 capsule (50,000 Units total) by mouth 2 (two) times a week., Disp: 12 capsule, Rfl: 3 .  metaxalone (SKELAXIN) 800 MG tablet, Take 800 mg by mouth 2 (two) times daily as needed. For migraines, Disp: , Rfl:  .  PRESCRIPTION MEDICATION, Apply 1 application topically 2 (two) times daily as needed (Cortisone). Eczema cream, Disp: , Rfl:  .  SUMAtriptan (IMITREX) 100 MG tablet, Take 100 mg by mouth every 2 (two) hours as needed. Take 1 tablet at onset of migraine   may repeat in 2 hours  Limit use to 2 days a week , Disp: , Rfl:  .  Toremifene Citrate (FARESTON) 60 MG tablet, Take 1 tablet (60 mg total) by mouth daily., Disp: 90 tablet, Rfl: 3  Allergies:  Allergies  Allergen Reactions  . Cephalexin Itching  . Penicillins Hives    Past Medical History, Surgical history, Social history, and Family History were reviewed and updated.  Review of Systems: As above  Physical Exam:  height is 5\' 6"  (1.676 m) and weight is 141 lb (63.957 kg). Pamela oral temperature is 97.6 F (36.4 C). Pamela blood pressure is 108/77 and Pamela pulse is 84. Pamela respiration is 16.   Well-developed and well-nourished white female. Head and neck exam shows no ocular or oral lesions. She has no periorbital edema. There is no intraoral lesions. She no adenopathy in the neck. Thyroid is not double. Lungs are clear. Cardiac exam regular rate and rhythm with no murmurs rubs or bruits. Breast exam shows right breast with no masses edema or erythema. There is no right axillary adenopathy. Left breast shows some slight hyperpigmentation from radiation. She has a well-healed lumpectomy scar at about the 2:00 position. There is no mass in the left breast. There is no left axillary adenopathy. Abdomen is soft. She has good bowel sounds. There is no fluid wave.  No palpable liver or spleen tip. Back exam shows no tenderness over the spine ribs or hips. Extremities shows no clubbing cyanosis or edema. She has the periungual wart on the medial aspect of the right thumb..  Lab Results  Component Value Date   WBC 3.6* 09/15/2015   HGB 13.6 09/15/2015   HCT 39.8 09/15/2015   MCV 87 09/15/2015   PLT 179 09/15/2015     Chemistry      Component Value Date/Time   NA 138 06/13/2015 0855   NA 137 05/19/2015 0250   NA 139 12/03/2013 1319   K 4.1 06/13/2015 0855   K 3.9 05/19/2015 0250   K 3.6 12/03/2013 1319   CL 108 05/19/2015 0250   CL 103 12/03/2013 1319   CO2 19* 06/13/2015 0855   CO2 22  05/19/2015 0250   CO2 31 12/03/2013 1319   BUN 15.3 06/13/2015 0855   BUN 12 05/19/2015 0250   BUN 14 12/03/2013 1319   CREATININE 0.7 06/13/2015 0855   CREATININE 0.68 05/19/2015 0250   CREATININE 0.9 12/03/2013 1319      Component Value Date/Time   CALCIUM 9.3 06/13/2015 0855   CALCIUM 9.5 05/19/2015 0250   CALCIUM 9.3 12/03/2013 1319   ALKPHOS 56 06/13/2015 0855   ALKPHOS 53 03/13/2015 1338   ALKPHOS 58 12/03/2013 1319   AST 15 06/13/2015 0855   AST 19 03/13/2015 1338   AST 23 12/03/2013 1319   ALT 11 06/13/2015 0855   ALT 15 03/13/2015 1338   ALT 14 12/03/2013 1319   BILITOT 0.49 06/13/2015 0855   BILITOT 0.7 03/13/2015 1338   BILITOT 0.60 12/03/2013 1319       Impression and Plan: Pamela Ray is 50 year old white female. She is a history of ductal carcinoma in situ of the left breast. She was diagnosed back in December of 2013. She underwent radiation. She is on Fareston We will keep Pamela on 5 years of Fareston.  I feel that for Pamela. She is very nice. I know that she will land on Pamela feet and get through this personal crisis that she is having.  I'm glad that she got a new job.  We will see what Pamela iron levels are. She still having Pamela monthly cycles.  I will plan to see Pamela back in another 3 months.   Pamela Riggs, MD 1/27/20179:16 AM

## 2015-09-16 LAB — HEMOGLOBIN A1C
Est. average glucose Bld gHb Est-mCnc: 114 mg/dL
HEMOGLOBIN A1C: 5.6 % (ref 4.8–5.6)

## 2015-09-16 LAB — LIPID PANEL
CHOL/HDL RATIO: 3.5 ratio (ref 0.0–4.4)
Cholesterol, Total: 193 mg/dL (ref 100–199)
HDL: 55 mg/dL (ref >39)
LDL CALC: 119 mg/dL — AB (ref 0–99)
Triglycerides: 96 mg/dL (ref 0–149)
VLDL Cholesterol Cal: 19 mg/dL (ref 5–40)

## 2015-09-18 ENCOUNTER — Encounter: Payer: Self-pay | Admitting: *Deleted

## 2015-09-18 ENCOUNTER — Telehealth: Payer: Self-pay | Admitting: *Deleted

## 2015-09-18 NOTE — Telephone Encounter (Addendum)
Patient aware of results.   ---- Message from Volanda Napoleon, MD sent at 09/16/2015 11:22 AM EST ----- Call - NO diabetes.  Cholesterol is ok.  The ratio of cholesterol/HDL is good!!  NO increased risk of heart disease!!!  pete

## 2015-09-21 ENCOUNTER — Ambulatory Visit (INDEPENDENT_AMBULATORY_CARE_PROVIDER_SITE_OTHER): Payer: 59 | Admitting: Psychology

## 2015-09-21 DIAGNOSIS — F332 Major depressive disorder, recurrent severe without psychotic features: Secondary | ICD-10-CM

## 2015-09-21 DIAGNOSIS — F411 Generalized anxiety disorder: Secondary | ICD-10-CM | POA: Diagnosis not present

## 2015-09-27 ENCOUNTER — Ambulatory Visit (INDEPENDENT_AMBULATORY_CARE_PROVIDER_SITE_OTHER): Payer: 59 | Admitting: Psychology

## 2015-09-27 DIAGNOSIS — F411 Generalized anxiety disorder: Secondary | ICD-10-CM

## 2015-09-27 DIAGNOSIS — F332 Major depressive disorder, recurrent severe without psychotic features: Secondary | ICD-10-CM

## 2015-10-18 ENCOUNTER — Ambulatory Visit (INDEPENDENT_AMBULATORY_CARE_PROVIDER_SITE_OTHER): Payer: 59 | Admitting: Psychology

## 2015-10-18 DIAGNOSIS — F332 Major depressive disorder, recurrent severe without psychotic features: Secondary | ICD-10-CM | POA: Diagnosis not present

## 2015-10-18 DIAGNOSIS — F411 Generalized anxiety disorder: Secondary | ICD-10-CM | POA: Diagnosis not present

## 2015-11-02 ENCOUNTER — Ambulatory Visit (INDEPENDENT_AMBULATORY_CARE_PROVIDER_SITE_OTHER): Payer: 59 | Admitting: Psychology

## 2015-11-02 DIAGNOSIS — F411 Generalized anxiety disorder: Secondary | ICD-10-CM

## 2015-11-02 DIAGNOSIS — F332 Major depressive disorder, recurrent severe without psychotic features: Secondary | ICD-10-CM | POA: Diagnosis not present

## 2015-11-09 ENCOUNTER — Ambulatory Visit (INDEPENDENT_AMBULATORY_CARE_PROVIDER_SITE_OTHER): Payer: 59 | Admitting: Psychology

## 2015-11-09 DIAGNOSIS — F332 Major depressive disorder, recurrent severe without psychotic features: Secondary | ICD-10-CM

## 2015-11-09 DIAGNOSIS — F411 Generalized anxiety disorder: Secondary | ICD-10-CM | POA: Diagnosis not present

## 2015-11-16 ENCOUNTER — Ambulatory Visit (INDEPENDENT_AMBULATORY_CARE_PROVIDER_SITE_OTHER): Payer: 59 | Admitting: Psychology

## 2015-11-16 DIAGNOSIS — F411 Generalized anxiety disorder: Secondary | ICD-10-CM | POA: Diagnosis not present

## 2015-11-16 DIAGNOSIS — F332 Major depressive disorder, recurrent severe without psychotic features: Secondary | ICD-10-CM

## 2015-11-23 ENCOUNTER — Ambulatory Visit (INDEPENDENT_AMBULATORY_CARE_PROVIDER_SITE_OTHER): Payer: 59 | Admitting: Psychology

## 2015-11-23 DIAGNOSIS — F332 Major depressive disorder, recurrent severe without psychotic features: Secondary | ICD-10-CM

## 2015-11-23 DIAGNOSIS — F411 Generalized anxiety disorder: Secondary | ICD-10-CM | POA: Diagnosis not present

## 2015-11-30 ENCOUNTER — Ambulatory Visit (INDEPENDENT_AMBULATORY_CARE_PROVIDER_SITE_OTHER): Payer: 59 | Admitting: Psychology

## 2015-11-30 DIAGNOSIS — F332 Major depressive disorder, recurrent severe without psychotic features: Secondary | ICD-10-CM

## 2015-11-30 DIAGNOSIS — F411 Generalized anxiety disorder: Secondary | ICD-10-CM

## 2015-12-07 ENCOUNTER — Ambulatory Visit (INDEPENDENT_AMBULATORY_CARE_PROVIDER_SITE_OTHER): Payer: 59 | Admitting: Psychology

## 2015-12-07 DIAGNOSIS — F332 Major depressive disorder, recurrent severe without psychotic features: Secondary | ICD-10-CM | POA: Diagnosis not present

## 2015-12-07 DIAGNOSIS — F411 Generalized anxiety disorder: Secondary | ICD-10-CM

## 2015-12-14 ENCOUNTER — Ambulatory Visit (INDEPENDENT_AMBULATORY_CARE_PROVIDER_SITE_OTHER): Payer: 59 | Admitting: Psychology

## 2015-12-14 ENCOUNTER — Ambulatory Visit: Payer: 59 | Admitting: Psychology

## 2015-12-14 DIAGNOSIS — F4323 Adjustment disorder with mixed anxiety and depressed mood: Secondary | ICD-10-CM

## 2015-12-15 ENCOUNTER — Telehealth: Payer: Self-pay | Admitting: Nurse Practitioner

## 2015-12-15 ENCOUNTER — Ambulatory Visit (HOSPITAL_BASED_OUTPATIENT_CLINIC_OR_DEPARTMENT_OTHER): Payer: 59 | Admitting: Hematology & Oncology

## 2015-12-15 ENCOUNTER — Other Ambulatory Visit: Payer: Self-pay | Admitting: Family

## 2015-12-15 ENCOUNTER — Other Ambulatory Visit (HOSPITAL_BASED_OUTPATIENT_CLINIC_OR_DEPARTMENT_OTHER): Payer: 59

## 2015-12-15 ENCOUNTER — Encounter: Payer: Self-pay | Admitting: Hematology & Oncology

## 2015-12-15 VITALS — BP 108/73 | HR 71 | Temp 98.0°F | Resp 16 | Ht 66.0 in | Wt 129.0 lb

## 2015-12-15 DIAGNOSIS — M810 Age-related osteoporosis without current pathological fracture: Secondary | ICD-10-CM

## 2015-12-15 DIAGNOSIS — D509 Iron deficiency anemia, unspecified: Secondary | ICD-10-CM | POA: Diagnosis not present

## 2015-12-15 DIAGNOSIS — R739 Hyperglycemia, unspecified: Secondary | ICD-10-CM

## 2015-12-15 DIAGNOSIS — D0512 Intraductal carcinoma in situ of left breast: Secondary | ICD-10-CM | POA: Diagnosis not present

## 2015-12-15 DIAGNOSIS — T386X5A Adverse effect of antigonadotrophins, antiestrogens, antiandrogens, not elsewhere classified, initial encounter: Secondary | ICD-10-CM

## 2015-12-15 DIAGNOSIS — F064 Anxiety disorder due to known physiological condition: Secondary | ICD-10-CM

## 2015-12-15 DIAGNOSIS — M818 Other osteoporosis without current pathological fracture: Secondary | ICD-10-CM

## 2015-12-15 DIAGNOSIS — E7801 Familial hypercholesterolemia: Secondary | ICD-10-CM

## 2015-12-15 DIAGNOSIS — E559 Vitamin D deficiency, unspecified: Secondary | ICD-10-CM

## 2015-12-15 LAB — COMPREHENSIVE METABOLIC PANEL
ALT: 10 U/L (ref 0–55)
AST: 15 U/L (ref 5–34)
Albumin: 3.6 g/dL (ref 3.5–5.0)
Alkaline Phosphatase: 61 U/L (ref 40–150)
Anion Gap: 8 mEq/L (ref 3–11)
BUN: 15.7 mg/dL (ref 7.0–26.0)
CO2: 22 meq/L (ref 22–29)
Calcium: 9.1 mg/dL (ref 8.4–10.4)
Chloride: 107 mEq/L (ref 98–109)
Creatinine: 0.8 mg/dL (ref 0.6–1.1)
EGFR: 89 mL/min/{1.73_m2} — AB (ref >90)
GLUCOSE: 151 mg/dL — AB (ref 70–140)
POTASSIUM: 4.3 meq/L (ref 3.5–5.1)
Sodium: 138 mEq/L (ref 136–145)
Total Bilirubin: 0.73 mg/dL (ref 0.20–1.20)
Total Protein: 6.5 g/dL (ref 6.4–8.3)

## 2015-12-15 LAB — CBC WITH DIFFERENTIAL (CANCER CENTER ONLY)
BASO#: 0 10*3/uL (ref 0.0–0.2)
BASO%: 0.4 % (ref 0.0–2.0)
EOS ABS: 0.2 10*3/uL (ref 0.0–0.5)
EOS%: 3.2 % (ref 0.0–7.0)
HCT: 41.6 % (ref 34.8–46.6)
HGB: 14.4 g/dL (ref 11.6–15.9)
LYMPH#: 1.4 10*3/uL (ref 0.9–3.3)
LYMPH%: 29.4 % (ref 14.0–48.0)
MCH: 29.8 pg (ref 26.0–34.0)
MCHC: 34.6 g/dL (ref 32.0–36.0)
MCV: 86 fL (ref 81–101)
MONO#: 0.5 10*3/uL (ref 0.1–0.9)
MONO%: 10.2 % (ref 0.0–13.0)
NEUT#: 2.7 10*3/uL (ref 1.5–6.5)
NEUT%: 56.8 % (ref 39.6–80.0)
PLATELETS: 183 10*3/uL (ref 145–400)
RBC: 4.84 10*6/uL (ref 3.70–5.32)
RDW: 13.9 % (ref 11.1–15.7)
WBC: 4.7 10*3/uL (ref 3.9–10.0)

## 2015-12-15 LAB — IRON AND TIBC
%SAT: 24 % (ref 21–57)
Iron: 98 ug/dL (ref 41–142)
TIBC: 409 ug/dL (ref 236–444)
UIBC: 310 ug/dL (ref 120–384)

## 2015-12-15 LAB — FERRITIN: Ferritin: 17 ng/ml (ref 9–269)

## 2015-12-15 MED ORDER — ALPRAZOLAM 0.25 MG PO TABS: 0.2500 mg | ORAL_TABLET | Freq: Three times a day (TID) | ORAL | Status: DC | PRN

## 2015-12-15 NOTE — Progress Notes (Signed)
Hematology and Oncology Follow Up Visit  Pamela Ray [id number] [birth year] 50 y.o. 12/15/2015   Principle Diagnosis:   DCIS of the left breast  Recurrent iron deficiency anemia  Current Therapy:    IV iron with Feraheme as indicated  Fareston 60mg  po q day     Interim History:  Ms.  Ray is back for followup. She is holding her own. There are still issues in her personal life that are a problem.  Her job is going pretty well. She does not have to travel to much for it  She is losing some weight. She is exercising. She is happy about the weight loss.  She still has her monthly cycles.  She still has problems with the fingernail issue. It does not side is much that can really be done for this. She does apply some medicine to it.   She has had no problems with the Fareston.  She did change pharmacies. There is a new pharmacy that we have to use.  She's had no fever. She's had no cough. She's had no shortness of breath.  She's had no rashes.  Overall, her performance status is ECOG 1.  Medications:  Current outpatient prescriptions:  .  BOTOX 100 UNITS SOLR, Every 3 months, Disp: , Rfl:  .  desonide (DESOWEN) 0.05 % ointment, Apply 1 application topically 2 (two) times daily. , Disp: , Rfl:  .  docusate sodium (COLACE) 100 MG capsule, Take 100 mg by mouth as needed for mild constipation., Disp: , Rfl:  .  ergocalciferol (VITAMIN D2) 50000 units capsule, Take 1 capsule (50,000 Units total) by mouth 2 (two) times a week., Disp: 12 capsule, Rfl: 3 .  ibuprofen (ADVIL,MOTRIN) 200 MG tablet, Take 200 mg by mouth every 6 (six) hours as needed., Disp: , Rfl:  .  metaxalone (SKELAXIN) 800 MG tablet, Take 800 mg by mouth 2 (two) times daily as needed. For migraines, Disp: , Rfl:  .  PRESCRIPTION MEDICATION, Apply 1 application topically 2 (two) times daily as needed (Cortisone). Eczema cream, Disp: , Rfl:  .  SUMAtriptan (IMITREX) 100 MG tablet, Take 100 mg by mouth every 2 (two)  hours as needed. Take 1 tablet at onset of migraine  may repeat in 2 hours  Limit use to 2 days a week , Disp: , Rfl:  .  Toremifene Citrate (FARESTON) 60 MG tablet, Take 1 tablet (60 mg total) by mouth daily., Disp: 90 tablet, Rfl: 3 .  vitamin A 10000 UNIT capsule, Take 10,000 Units by mouth daily., Disp: , Rfl:  .  ALPRAZolam (XANAX) 0.25 MG tablet, Take 1 tablet (0.25 mg total) by mouth 3 (three) times daily as needed for anxiety., Disp: 20 tablet, Rfl: 0  Allergies:  Allergies  Allergen Reactions  . Cephalexin Itching  . Penicillins Hives    Past Medical History, Surgical history, Social history, and Family History were reviewed and updated.  Review of Systems: As above  Physical Exam:  height is 5\' 6"  (1.676 m) and weight is 129 lb (58.514 kg). Her temperature is 98 F (36.7 C). Her blood pressure is 108/73 and her pulse is 71. Her respiration is 16.   Well-developed and well-nourished white female. Head and neck exam shows no ocular or oral lesions. She has no periorbital edema. There is no intraoral lesions. She no adenopathy in the neck. Thyroid is not double. Lungs are clear. Cardiac exam regular rate and rhythm with no murmurs rubs or bruits. Breast exam shows right  breast with no masses edema or erythema. There is no right axillary adenopathy. Left breast shows some slight hyperpigmentation from radiation. She has a well-healed lumpectomy scar at about the 2:00 position. There is no mass in the left breast. There is no left axillary adenopathy. Abdomen is soft. She has good bowel sounds. There is no fluid wave. No palpable liver or spleen tip. Back exam shows no tenderness over the spine ribs or hips. Extremities shows no clubbing cyanosis or edema. She has the periungual wart on the medial aspect of the right thumb..  Lab Results  Component Value Date   WBC 4.7 12/15/2015   HGB 14.4 12/15/2015   HCT 41.6 12/15/2015   MCV 86 12/15/2015   PLT 183 12/15/2015     Chemistry       Component Value Date/Time   NA 138 12/15/2015 0807   NA 137 05/19/2015 0250   NA 139 12/03/2013 1319   K 4.3 12/15/2015 0807   K 3.9 05/19/2015 0250   K 3.6 12/03/2013 1319   CL 108 05/19/2015 0250   CL 103 12/03/2013 1319   CO2 22 12/15/2015 0807   CO2 22 05/19/2015 0250   CO2 31 12/03/2013 1319   BUN 15.7 12/15/2015 0807   BUN 12 05/19/2015 0250   BUN 14 12/03/2013 1319   CREATININE 0.8 12/15/2015 0807   CREATININE 0.68 05/19/2015 0250   CREATININE 0.9 12/03/2013 1319      Component Value Date/Time   CALCIUM 9.1 12/15/2015 0807   CALCIUM 9.5 05/19/2015 0250   CALCIUM 9.3 12/03/2013 1319   ALKPHOS 61 12/15/2015 0807   ALKPHOS 53 03/13/2015 1338   ALKPHOS 58 12/03/2013 1319   AST 15 12/15/2015 0807   AST 19 03/13/2015 1338   AST 23 12/03/2013 1319   ALT 10 12/15/2015 0807   ALT 15 03/13/2015 1338   ALT 14 12/03/2013 1319   BILITOT 0.73 12/15/2015 0807   BILITOT 0.7 03/13/2015 1338   BILITOT 0.60 12/03/2013 1319       Impression and Plan: Pamela Ray is 50 year old white female. She is a history of ductal carcinoma in situ of the left breast. She was diagnosed back in December of 2013. She underwent radiation. She is on Fareston We will keep her on 5 years of Fareston.  I do feel bad about her having this fingernail issue. It is not some other disease in back and be done about this.  We will see what her iron studies show. It is still possible that her iron might be a little on the low side despite her blood count being okay. If so, I will give her some iron.  I spent about 40 minutes with her talking to her about the issues in her life. It is nice to talk to her and try to provide some encouragement.  I think we can probably get her through most of summer and get her back in 4 months.  Nile Riggs, MD 4/28/20175:28 PM

## 2015-12-15 NOTE — Telephone Encounter (Signed)
Pt verbalized the understanding and has been scheduled for iron as ordered.

## 2015-12-16 LAB — VITAMIN D 25 HYDROXY (VIT D DEFICIENCY, FRACTURES): VIT D 25 HYDROXY: 52 ng/mL (ref 30.0–100.0)

## 2015-12-18 ENCOUNTER — Encounter: Payer: Self-pay | Admitting: *Deleted

## 2015-12-18 ENCOUNTER — Telehealth: Payer: Self-pay | Admitting: *Deleted

## 2015-12-18 NOTE — Telephone Encounter (Addendum)
Patient aware of results  ----- Message from Volanda Napoleon, MD sent at 12/18/2015  9:49 AM EDT ----- Call - vit D is great!!  pete

## 2015-12-20 ENCOUNTER — Ambulatory Visit (INDEPENDENT_AMBULATORY_CARE_PROVIDER_SITE_OTHER): Payer: 59 | Admitting: Psychology

## 2015-12-20 DIAGNOSIS — F4323 Adjustment disorder with mixed anxiety and depressed mood: Secondary | ICD-10-CM

## 2015-12-25 ENCOUNTER — Other Ambulatory Visit: Payer: Self-pay | Admitting: Family

## 2015-12-25 ENCOUNTER — Ambulatory Visit (HOSPITAL_BASED_OUTPATIENT_CLINIC_OR_DEPARTMENT_OTHER): Payer: 59

## 2015-12-25 VITALS — BP 110/66 | HR 72 | Temp 98.2°F | Resp 18

## 2015-12-25 DIAGNOSIS — D509 Iron deficiency anemia, unspecified: Secondary | ICD-10-CM | POA: Diagnosis not present

## 2015-12-25 MED ORDER — METHYLPREDNISOLONE SODIUM SUCC 125 MG IJ SOLR
INTRAMUSCULAR | Status: AC
  Filled 2015-12-25: qty 2

## 2015-12-25 MED ORDER — DIPHENHYDRAMINE HCL 50 MG/ML IJ SOLN
INTRAMUSCULAR | Status: AC
  Filled 2015-12-25: qty 1

## 2015-12-25 MED ORDER — SODIUM CHLORIDE 0.9 % IV SOLN
Freq: Once | INTRAVENOUS | Status: AC
  Administered 2015-12-25: 15:00:00 via INTRAVENOUS

## 2015-12-25 MED ORDER — FAMOTIDINE IN NACL 20-0.9 MG/50ML-% IV SOLN
20.0000 mg | Freq: Two times a day (BID) | INTRAVENOUS | Status: DC
  Administered 2015-12-25: 20 mg via INTRAVENOUS

## 2015-12-25 MED ORDER — SODIUM CHLORIDE 0.9 % IV SOLN
510.0000 mg | Freq: Once | INTRAVENOUS | Status: AC
  Administered 2015-12-25: 510 mg via INTRAVENOUS
  Filled 2015-12-25: qty 17

## 2015-12-25 MED ORDER — METHYLPREDNISOLONE SODIUM SUCC 125 MG IJ SOLR
125.0000 mg | Freq: Once | INTRAMUSCULAR | Status: AC
  Administered 2015-12-25: 125 mg via INTRAVENOUS

## 2015-12-25 MED ORDER — FAMOTIDINE IN NACL 20-0.9 MG/50ML-% IV SOLN
INTRAVENOUS | Status: AC
  Filled 2015-12-25: qty 50

## 2015-12-25 NOTE — Progress Notes (Signed)
Feraheme infused over 30 minutes d/t hx of reaction. Pre-meds administered prior to infusion. Tolerated infusion without event. Will monitor for 30 minutes post-infusion. dph

## 2015-12-25 NOTE — Patient Instructions (Signed)

## 2015-12-28 ENCOUNTER — Ambulatory Visit (INDEPENDENT_AMBULATORY_CARE_PROVIDER_SITE_OTHER): Payer: 59 | Admitting: Psychology

## 2015-12-28 DIAGNOSIS — F4323 Adjustment disorder with mixed anxiety and depressed mood: Secondary | ICD-10-CM | POA: Diagnosis not present

## 2016-01-04 ENCOUNTER — Ambulatory Visit (INDEPENDENT_AMBULATORY_CARE_PROVIDER_SITE_OTHER): Payer: 59 | Admitting: Psychology

## 2016-01-04 DIAGNOSIS — F4323 Adjustment disorder with mixed anxiety and depressed mood: Secondary | ICD-10-CM

## 2016-01-09 ENCOUNTER — Encounter: Payer: Self-pay | Admitting: Hematology & Oncology

## 2016-01-09 ENCOUNTER — Other Ambulatory Visit: Payer: Self-pay | Admitting: *Deleted

## 2016-01-09 DIAGNOSIS — R739 Hyperglycemia, unspecified: Secondary | ICD-10-CM

## 2016-01-09 DIAGNOSIS — D509 Iron deficiency anemia, unspecified: Secondary | ICD-10-CM

## 2016-01-09 DIAGNOSIS — M810 Age-related osteoporosis without current pathological fracture: Secondary | ICD-10-CM

## 2016-01-09 DIAGNOSIS — E7801 Familial hypercholesterolemia: Secondary | ICD-10-CM

## 2016-01-09 DIAGNOSIS — E559 Vitamin D deficiency, unspecified: Secondary | ICD-10-CM

## 2016-01-09 DIAGNOSIS — D0512 Intraductal carcinoma in situ of left breast: Secondary | ICD-10-CM

## 2016-01-09 MED ORDER — TOREMIFENE CITRATE 60 MG PO TABS
60.0000 mg | ORAL_TABLET | Freq: Every day | ORAL | Status: DC
Start: 2016-01-09 — End: 2016-05-30

## 2016-01-18 ENCOUNTER — Encounter: Payer: Self-pay | Admitting: Hematology & Oncology

## 2016-01-18 ENCOUNTER — Ambulatory Visit (INDEPENDENT_AMBULATORY_CARE_PROVIDER_SITE_OTHER): Payer: 59 | Admitting: Psychology

## 2016-01-18 DIAGNOSIS — F332 Major depressive disorder, recurrent severe without psychotic features: Secondary | ICD-10-CM

## 2016-01-18 DIAGNOSIS — F411 Generalized anxiety disorder: Secondary | ICD-10-CM | POA: Diagnosis not present

## 2016-01-25 ENCOUNTER — Ambulatory Visit (INDEPENDENT_AMBULATORY_CARE_PROVIDER_SITE_OTHER): Payer: 59 | Admitting: Psychology

## 2016-01-25 DIAGNOSIS — F332 Major depressive disorder, recurrent severe without psychotic features: Secondary | ICD-10-CM | POA: Diagnosis not present

## 2016-02-01 ENCOUNTER — Ambulatory Visit: Payer: 59 | Admitting: Psychology

## 2016-02-08 ENCOUNTER — Ambulatory Visit (INDEPENDENT_AMBULATORY_CARE_PROVIDER_SITE_OTHER): Payer: 59 | Admitting: Psychology

## 2016-02-08 DIAGNOSIS — F411 Generalized anxiety disorder: Secondary | ICD-10-CM

## 2016-02-08 DIAGNOSIS — F332 Major depressive disorder, recurrent severe without psychotic features: Secondary | ICD-10-CM | POA: Diagnosis not present

## 2016-02-15 ENCOUNTER — Ambulatory Visit (INDEPENDENT_AMBULATORY_CARE_PROVIDER_SITE_OTHER): Payer: 59 | Admitting: Psychology

## 2016-02-15 DIAGNOSIS — F332 Major depressive disorder, recurrent severe without psychotic features: Secondary | ICD-10-CM

## 2016-02-29 ENCOUNTER — Ambulatory Visit (INDEPENDENT_AMBULATORY_CARE_PROVIDER_SITE_OTHER): Payer: 59 | Admitting: Psychology

## 2016-02-29 DIAGNOSIS — F4320 Adjustment disorder, unspecified: Secondary | ICD-10-CM

## 2016-03-07 ENCOUNTER — Ambulatory Visit (INDEPENDENT_AMBULATORY_CARE_PROVIDER_SITE_OTHER): Payer: 59 | Admitting: Psychology

## 2016-03-07 DIAGNOSIS — F332 Major depressive disorder, recurrent severe without psychotic features: Secondary | ICD-10-CM | POA: Diagnosis not present

## 2016-03-15 ENCOUNTER — Ambulatory Visit (INDEPENDENT_AMBULATORY_CARE_PROVIDER_SITE_OTHER): Payer: 59 | Admitting: Psychology

## 2016-03-15 DIAGNOSIS — F4320 Adjustment disorder, unspecified: Secondary | ICD-10-CM

## 2016-03-21 ENCOUNTER — Ambulatory Visit (INDEPENDENT_AMBULATORY_CARE_PROVIDER_SITE_OTHER): Payer: 59 | Admitting: Psychology

## 2016-03-21 DIAGNOSIS — F4323 Adjustment disorder with mixed anxiety and depressed mood: Secondary | ICD-10-CM | POA: Diagnosis not present

## 2016-03-27 ENCOUNTER — Ambulatory Visit (INDEPENDENT_AMBULATORY_CARE_PROVIDER_SITE_OTHER): Payer: 59 | Admitting: Psychology

## 2016-03-27 DIAGNOSIS — F4320 Adjustment disorder, unspecified: Secondary | ICD-10-CM

## 2016-03-28 ENCOUNTER — Ambulatory Visit: Payer: 59 | Admitting: Psychology

## 2016-03-28 ENCOUNTER — Ambulatory Visit (INDEPENDENT_AMBULATORY_CARE_PROVIDER_SITE_OTHER): Payer: 59 | Admitting: Psychology

## 2016-03-28 DIAGNOSIS — F4320 Adjustment disorder, unspecified: Secondary | ICD-10-CM

## 2016-04-04 ENCOUNTER — Ambulatory Visit (INDEPENDENT_AMBULATORY_CARE_PROVIDER_SITE_OTHER): Payer: 59 | Admitting: Psychology

## 2016-04-04 DIAGNOSIS — F4320 Adjustment disorder, unspecified: Secondary | ICD-10-CM | POA: Diagnosis not present

## 2016-04-11 ENCOUNTER — Ambulatory Visit (INDEPENDENT_AMBULATORY_CARE_PROVIDER_SITE_OTHER): Payer: 59 | Admitting: Psychology

## 2016-04-11 DIAGNOSIS — F4320 Adjustment disorder, unspecified: Secondary | ICD-10-CM | POA: Diagnosis not present

## 2016-04-12 ENCOUNTER — Other Ambulatory Visit: Payer: 59

## 2016-04-12 ENCOUNTER — Ambulatory Visit: Payer: 59 | Admitting: Hematology & Oncology

## 2016-04-18 ENCOUNTER — Ambulatory Visit (INDEPENDENT_AMBULATORY_CARE_PROVIDER_SITE_OTHER): Payer: 59 | Admitting: Psychology

## 2016-04-18 DIAGNOSIS — F4323 Adjustment disorder with mixed anxiety and depressed mood: Secondary | ICD-10-CM

## 2016-05-02 ENCOUNTER — Ambulatory Visit (INDEPENDENT_AMBULATORY_CARE_PROVIDER_SITE_OTHER): Payer: 59 | Admitting: Psychology

## 2016-05-02 DIAGNOSIS — F4320 Adjustment disorder, unspecified: Secondary | ICD-10-CM | POA: Diagnosis not present

## 2016-05-16 ENCOUNTER — Ambulatory Visit (INDEPENDENT_AMBULATORY_CARE_PROVIDER_SITE_OTHER): Payer: 59 | Admitting: Psychology

## 2016-05-16 DIAGNOSIS — F4320 Adjustment disorder, unspecified: Secondary | ICD-10-CM

## 2016-05-22 ENCOUNTER — Other Ambulatory Visit: Payer: 59

## 2016-05-22 ENCOUNTER — Ambulatory Visit: Payer: 59 | Admitting: Hematology & Oncology

## 2016-05-23 ENCOUNTER — Ambulatory Visit (INDEPENDENT_AMBULATORY_CARE_PROVIDER_SITE_OTHER): Payer: 59 | Admitting: Psychology

## 2016-05-23 DIAGNOSIS — F411 Generalized anxiety disorder: Secondary | ICD-10-CM

## 2016-05-23 DIAGNOSIS — F332 Major depressive disorder, recurrent severe without psychotic features: Secondary | ICD-10-CM

## 2016-05-30 ENCOUNTER — Encounter: Payer: Self-pay | Admitting: Hematology & Oncology

## 2016-05-30 ENCOUNTER — Ambulatory Visit (HOSPITAL_BASED_OUTPATIENT_CLINIC_OR_DEPARTMENT_OTHER): Payer: 59 | Admitting: Hematology & Oncology

## 2016-05-30 ENCOUNTER — Ambulatory Visit (INDEPENDENT_AMBULATORY_CARE_PROVIDER_SITE_OTHER): Payer: 59 | Admitting: Psychology

## 2016-05-30 ENCOUNTER — Telehealth: Payer: Self-pay | Admitting: *Deleted

## 2016-05-30 ENCOUNTER — Other Ambulatory Visit (HOSPITAL_BASED_OUTPATIENT_CLINIC_OR_DEPARTMENT_OTHER): Payer: 59

## 2016-05-30 DIAGNOSIS — T386X5A Adverse effect of antigonadotrophins, antiestrogens, antiandrogens, not elsewhere classified, initial encounter: Secondary | ICD-10-CM

## 2016-05-30 DIAGNOSIS — M818 Other osteoporosis without current pathological fracture: Secondary | ICD-10-CM

## 2016-05-30 DIAGNOSIS — D0512 Intraductal carcinoma in situ of left breast: Secondary | ICD-10-CM

## 2016-05-30 DIAGNOSIS — D509 Iron deficiency anemia, unspecified: Secondary | ICD-10-CM | POA: Diagnosis not present

## 2016-05-30 DIAGNOSIS — E7801 Familial hypercholesterolemia: Secondary | ICD-10-CM

## 2016-05-30 DIAGNOSIS — D508 Other iron deficiency anemias: Secondary | ICD-10-CM

## 2016-05-30 DIAGNOSIS — E78019 Familial hypercholesterolemia, unspecified: Secondary | ICD-10-CM

## 2016-05-30 DIAGNOSIS — F064 Anxiety disorder due to known physiological condition: Secondary | ICD-10-CM

## 2016-05-30 DIAGNOSIS — F332 Major depressive disorder, recurrent severe without psychotic features: Secondary | ICD-10-CM

## 2016-05-30 DIAGNOSIS — F411 Generalized anxiety disorder: Secondary | ICD-10-CM | POA: Diagnosis not present

## 2016-05-30 DIAGNOSIS — Z79810 Long term (current) use of selective estrogen receptor modulators (SERMs): Secondary | ICD-10-CM | POA: Diagnosis not present

## 2016-05-30 DIAGNOSIS — M810 Age-related osteoporosis without current pathological fracture: Secondary | ICD-10-CM

## 2016-05-30 DIAGNOSIS — R739 Hyperglycemia, unspecified: Secondary | ICD-10-CM

## 2016-05-30 DIAGNOSIS — E559 Vitamin D deficiency, unspecified: Secondary | ICD-10-CM

## 2016-05-30 LAB — COMPREHENSIVE METABOLIC PANEL
ALT: 8 U/L (ref 0–55)
ANION GAP: 9 meq/L (ref 3–11)
AST: 15 U/L (ref 5–34)
Albumin: 3.6 g/dL (ref 3.5–5.0)
Alkaline Phosphatase: 66 U/L (ref 40–150)
BUN: 18.7 mg/dL (ref 7.0–26.0)
CALCIUM: 9.2 mg/dL (ref 8.4–10.4)
CHLORIDE: 107 meq/L (ref 98–109)
CO2: 22 meq/L (ref 22–29)
Creatinine: 0.8 mg/dL (ref 0.6–1.1)
Glucose: 90 mg/dl (ref 70–140)
POTASSIUM: 4.2 meq/L (ref 3.5–5.1)
Sodium: 138 mEq/L (ref 136–145)
Total Bilirubin: 0.6 mg/dL (ref 0.20–1.20)
Total Protein: 6.7 g/dL (ref 6.4–8.3)

## 2016-05-30 LAB — FERRITIN: FERRITIN: 13 ng/mL (ref 9–269)

## 2016-05-30 LAB — CBC WITH DIFFERENTIAL (CANCER CENTER ONLY)
BASO#: 0 10*3/uL (ref 0.0–0.2)
BASO%: 0.6 % (ref 0.0–2.0)
EOS ABS: 0.2 10*3/uL (ref 0.0–0.5)
EOS%: 2.9 % (ref 0.0–7.0)
HCT: 41 % (ref 34.8–46.6)
HEMOGLOBIN: 14 g/dL (ref 11.6–15.9)
LYMPH#: 1.4 10*3/uL (ref 0.9–3.3)
LYMPH%: 27.3 % (ref 14.0–48.0)
MCH: 29.1 pg (ref 26.0–34.0)
MCHC: 34.1 g/dL (ref 32.0–36.0)
MCV: 85 fL (ref 81–101)
MONO#: 0.5 10*3/uL (ref 0.1–0.9)
MONO%: 10.5 % (ref 0.0–13.0)
NEUT%: 58.7 % (ref 39.6–80.0)
NEUTROS ABS: 3 10*3/uL (ref 1.5–6.5)
PLATELETS: 224 10*3/uL (ref 145–400)
RBC: 4.81 10*6/uL (ref 3.70–5.32)
RDW: 12.8 % (ref 11.1–15.7)
WBC: 5.1 10*3/uL (ref 3.9–10.0)

## 2016-05-30 LAB — IRON AND TIBC
%SAT: 12 % — AB (ref 21–57)
IRON: 53 ug/dL (ref 41–142)
TIBC: 443 ug/dL (ref 236–444)
UIBC: 389 ug/dL — AB (ref 120–384)

## 2016-05-30 MED ORDER — ALPRAZOLAM 0.25 MG PO TABS: 0.2500 mg | ORAL_TABLET | Freq: Three times a day (TID) | ORAL | 0 refills | Status: DC | PRN

## 2016-05-30 MED ORDER — ERGOCALCIFEROL 1.25 MG (50000 UT) PO CAPS: 50000.0000 [IU] | ORAL_CAPSULE | ORAL | 3 refills | Status: DC

## 2016-05-30 MED ORDER — TOREMIFENE CITRATE 60 MG PO TABS: 60.0000 mg | ORAL_TABLET | Freq: Every day | ORAL | 3 refills | Status: DC

## 2016-05-30 NOTE — Progress Notes (Signed)
Hematology and Oncology Follow Up Visit  Pamela Ray [id number] [birth year] 50 y.o. 05/30/2016   Principle Diagnosis:   DCIS of the left breast  Recurrent iron deficiency anemia  Current Therapy:    IV iron with Feraheme as indicated - last dose was May 2017  Fareston 60mg  po q day     Interim History:  Ms.  Pamela Ray is back for followup. She is holding her own. There are still issues in her personal life that are a problem. She is now divorced. Her husband moved out. She is doing her best to help her 2 kids. Thankfully, they are incredibly mature and are doing a good job to help her.  We'll last saw her back in April, her iron saturation study was only 17%. She did go and get a dose of iron in May.  Her job is going pretty well. She does not have to travel to much for it. She was in [place] for a conference this summer. She had a good time.  She is losing some weight. She is exercising. She is happy about the weight loss.  She still has her monthly cycles.  She still has problems with the fingernail issue. I think she goes out to [place] for this.  She is taking her vitamin D.  She has had no problems with the Fareston.  She did change pharmacies. There is a new pharmacy that we have to use.  She's had no fever. She's had no cough. She's had no shortness of breath.  She's had no rashes.  Overall, her performance status is ECOG 0.  Medications:  Current Outpatient Prescriptions:  .  ALPRAZolam (XANAX) 0.25 MG tablet, Take 1 tablet (0.25 mg total) by mouth 3 (three) times daily as needed for anxiety., Disp: 20 tablet, Rfl: 0 .  BOTOX 100 UNITS SOLR, Every 3 months, Disp: , Rfl:  .  desonide (DESOWEN) 0.05 % ointment, Apply 1 application topically 2 (two) times daily. , Disp: , Rfl:  .  docusate sodium (COLACE) 100 MG capsule, Take 100 mg by mouth as needed for mild constipation., Disp: , Rfl:  .  ergocalciferol (VITAMIN D2) 50000 units capsule, Take 1 capsule (50,000 Units  total) by mouth 2 (two) times a week., Disp: 12 capsule, Rfl: 3 .  ibuprofen (ADVIL,MOTRIN) 200 MG tablet, Take 200 mg by mouth every 6 (six) hours as needed., Disp: , Rfl:  .  metaxalone (SKELAXIN) 800 MG tablet, Take 800 mg by mouth 2 (two) times daily as needed. For migraines, Disp: , Rfl:  .  PRESCRIPTION MEDICATION, Apply 1 application topically 2 (two) times daily as needed (Cortisone). Eczema cream, Disp: , Rfl:  .  SUMAtriptan (IMITREX) 100 MG tablet, Take 100 mg by mouth every 2 (two) hours as needed. Take 1 tablet at onset of migraine  may repeat in 2 hours  Limit use to 2 days a week , Disp: , Rfl:  .  Toremifene Citrate (FARESTON) 60 MG tablet, Take 1 tablet (60 mg total) by mouth daily. 90 day supply, Disp: 90 tablet, Rfl: 3 .  vitamin A 10000 UNIT capsule, Take 10,000 Units by mouth daily., Disp: , Rfl:   Allergies:  Allergies  Allergen Reactions  . Amoxicillin Itching  . Cephalexin Itching  . Cephalexin Itching  . Penicillins Hives    Past Medical History, Surgical history, Social history, and Family History were reviewed and updated.  Review of Systems: As above  Physical Exam:  height is 5\' 6"  (1.676 m)  and weight is 131 lb 1.9 oz (59.5 kg). Her oral temperature is 97.8 F (36.6 C). Her blood pressure is 93/66 and her pulse is 84. Her respiration is 16.   Well-developed and well-nourished white female. Head and neck exam shows no ocular or oral lesions. She has no periorbital edema. There is no intraoral lesions. She no adenopathy in the neck. Thyroid is not double. Lungs are clear. Cardiac exam regular rate and rhythm with no murmurs rubs or bruits. Breast exam shows right breast with no masses edema or erythema. There is no right axillary adenopathy. Left breast shows some slight hyperpigmentation from radiation. She has a well-healed lumpectomy scar at about the 2:00 position. There is no mass in the left breast. There is no left axillary adenopathy. Abdomen is soft. She  has good bowel sounds. There is no fluid wave. No palpable liver or spleen tip. Back exam shows no tenderness over the spine ribs or hips. Extremities shows no clubbing cyanosis or edema. She has the periungual wart on the medial aspect of the right thumb..  Lab Results  Component Value Date   WBC 5.1 05/30/2016   HGB 14.0 05/30/2016   HCT 41.0 05/30/2016   MCV 85 05/30/2016   PLT 224 05/30/2016     Chemistry      Component Value Date/Time   NA 138 12/15/2015 0807   K 4.3 12/15/2015 0807   CL 108 05/19/2015 0250   CL 103 12/03/2013 1319   CO2 22 12/15/2015 0807   BUN 15.7 12/15/2015 0807   CREATININE 0.8 12/15/2015 0807      Component Value Date/Time   CALCIUM 9.1 12/15/2015 0807   ALKPHOS 61 12/15/2015 0807   AST 15 12/15/2015 0807   ALT 10 12/15/2015 0807   BILITOT 0.73 12/15/2015 0807       Impression and Plan: [name] is 50 year old white female. She is a history of ductal carcinoma in situ of the left breast. She was diagnosed back in December of 2013. She underwent radiation. She is on Fareston We will keep her on 5 years of Fareston.  I do feel bad about her having this fingernail issue. She goes back to [facility] for evaluation and week or so.  We will see what her iron studies show. It is still possible that her iron might be a little on the low side despite her blood count being okay. If so, I will give her some iron.  I spent about 40 minutes with her talking to her about the issues in her life. It is nice to talk to her and try to provide some encouragement.  I think we can probably get her through most of holidays and wintertime and get her back in 6 months.  Pamela Riggs, MD 10/12/20179:00 AM

## 2016-05-30 NOTE — Telephone Encounter (Addendum)
Patient is aware of results. Transferred to the scheduler for appointment  ----- Message from Volanda Napoleon, MD sent at 05/30/2016  1:42 PM EDT ----- Call - iron level is quite low!!!  Need feraheme x 1 dose!!  pete

## 2016-05-31 ENCOUNTER — Encounter: Payer: Self-pay | Admitting: Nurse Practitioner

## 2016-05-31 LAB — VITAMIN D 25 HYDROXY (VIT D DEFICIENCY, FRACTURES): Vitamin D, 25-Hydroxy: 27.5 ng/mL — ABNORMAL LOW (ref 30.0–100.0)

## 2016-06-05 ENCOUNTER — Ambulatory Visit (INDEPENDENT_AMBULATORY_CARE_PROVIDER_SITE_OTHER): Payer: 59 | Admitting: Psychology

## 2016-06-05 DIAGNOSIS — F332 Major depressive disorder, recurrent severe without psychotic features: Secondary | ICD-10-CM | POA: Diagnosis not present

## 2016-06-05 DIAGNOSIS — F411 Generalized anxiety disorder: Secondary | ICD-10-CM

## 2016-06-07 ENCOUNTER — Ambulatory Visit (HOSPITAL_BASED_OUTPATIENT_CLINIC_OR_DEPARTMENT_OTHER): Payer: 59

## 2016-06-07 VITALS — BP 98/63 | HR 74 | Temp 98.0°F | Resp 18

## 2016-06-07 DIAGNOSIS — D509 Iron deficiency anemia, unspecified: Secondary | ICD-10-CM

## 2016-06-07 DIAGNOSIS — D508 Other iron deficiency anemias: Secondary | ICD-10-CM

## 2016-06-07 MED ORDER — SODIUM CHLORIDE 0.9 % IV SOLN
510.0000 mg | Freq: Once | INTRAVENOUS | Status: AC
  Administered 2016-06-07: 510 mg via INTRAVENOUS
  Filled 2016-06-07: qty 17

## 2016-06-07 MED ORDER — FAMOTIDINE IN NACL 20-0.9 MG/50ML-% IV SOLN
20.0000 mg | Freq: Two times a day (BID) | INTRAVENOUS | Status: DC
  Administered 2016-06-07: 20 mg via INTRAVENOUS

## 2016-06-07 MED ORDER — METHYLPREDNISOLONE SODIUM SUCC 125 MG IJ SOLR
INTRAMUSCULAR | Status: AC
  Filled 2016-06-07: qty 2

## 2016-06-07 MED ORDER — SODIUM CHLORIDE 0.9 % IV SOLN
Freq: Once | INTRAVENOUS | Status: AC
  Administered 2016-06-07: 15:00:00 via INTRAVENOUS

## 2016-06-07 MED ORDER — METHYLPREDNISOLONE SODIUM SUCC 125 MG IJ SOLR
125.0000 mg | Freq: Once | INTRAMUSCULAR | Status: AC
  Administered 2016-06-07: 125 mg via INTRAVENOUS

## 2016-06-07 MED ORDER — FAMOTIDINE IN NACL 20-0.9 MG/50ML-% IV SOLN
INTRAVENOUS | Status: AC
  Filled 2016-06-07: qty 50

## 2016-06-07 NOTE — Patient Instructions (Signed)

## 2016-06-13 ENCOUNTER — Ambulatory Visit (INDEPENDENT_AMBULATORY_CARE_PROVIDER_SITE_OTHER): Payer: 59 | Admitting: Psychology

## 2016-06-13 DIAGNOSIS — F332 Major depressive disorder, recurrent severe without psychotic features: Secondary | ICD-10-CM | POA: Diagnosis not present

## 2016-06-13 DIAGNOSIS — F411 Generalized anxiety disorder: Secondary | ICD-10-CM | POA: Diagnosis not present

## 2016-06-20 ENCOUNTER — Ambulatory Visit (INDEPENDENT_AMBULATORY_CARE_PROVIDER_SITE_OTHER): Payer: 59 | Admitting: Psychology

## 2016-06-20 DIAGNOSIS — F332 Major depressive disorder, recurrent severe without psychotic features: Secondary | ICD-10-CM

## 2016-06-20 DIAGNOSIS — F411 Generalized anxiety disorder: Secondary | ICD-10-CM | POA: Diagnosis not present

## 2016-06-27 ENCOUNTER — Ambulatory Visit (INDEPENDENT_AMBULATORY_CARE_PROVIDER_SITE_OTHER): Payer: 59 | Admitting: Psychology

## 2016-06-27 DIAGNOSIS — F411 Generalized anxiety disorder: Secondary | ICD-10-CM

## 2016-06-27 DIAGNOSIS — F332 Major depressive disorder, recurrent severe without psychotic features: Secondary | ICD-10-CM | POA: Diagnosis not present

## 2016-07-04 ENCOUNTER — Ambulatory Visit (INDEPENDENT_AMBULATORY_CARE_PROVIDER_SITE_OTHER): Payer: 59 | Admitting: Psychology

## 2016-07-04 DIAGNOSIS — F332 Major depressive disorder, recurrent severe without psychotic features: Secondary | ICD-10-CM

## 2016-07-04 DIAGNOSIS — F411 Generalized anxiety disorder: Secondary | ICD-10-CM

## 2016-07-18 ENCOUNTER — Ambulatory Visit (INDEPENDENT_AMBULATORY_CARE_PROVIDER_SITE_OTHER): Payer: 59 | Admitting: Psychology

## 2016-07-18 DIAGNOSIS — F332 Major depressive disorder, recurrent severe without psychotic features: Secondary | ICD-10-CM

## 2016-07-18 DIAGNOSIS — F411 Generalized anxiety disorder: Secondary | ICD-10-CM

## 2016-07-25 ENCOUNTER — Ambulatory Visit (INDEPENDENT_AMBULATORY_CARE_PROVIDER_SITE_OTHER): Payer: 59 | Admitting: Psychology

## 2016-07-25 DIAGNOSIS — F332 Major depressive disorder, recurrent severe without psychotic features: Secondary | ICD-10-CM | POA: Diagnosis not present

## 2016-07-25 DIAGNOSIS — F411 Generalized anxiety disorder: Secondary | ICD-10-CM | POA: Diagnosis not present

## 2016-08-07 ENCOUNTER — Ambulatory Visit (INDEPENDENT_AMBULATORY_CARE_PROVIDER_SITE_OTHER): Payer: 59 | Admitting: Psychology

## 2016-08-07 DIAGNOSIS — F332 Major depressive disorder, recurrent severe without psychotic features: Secondary | ICD-10-CM

## 2016-08-07 DIAGNOSIS — F411 Generalized anxiety disorder: Secondary | ICD-10-CM

## 2016-08-08 ENCOUNTER — Ambulatory Visit (INDEPENDENT_AMBULATORY_CARE_PROVIDER_SITE_OTHER): Payer: 59 | Admitting: Psychology

## 2016-08-08 DIAGNOSIS — F332 Major depressive disorder, recurrent severe without psychotic features: Secondary | ICD-10-CM | POA: Diagnosis not present

## 2016-08-09 ENCOUNTER — Ambulatory Visit (INDEPENDENT_AMBULATORY_CARE_PROVIDER_SITE_OTHER): Payer: 59 | Admitting: Psychology

## 2016-08-09 DIAGNOSIS — F332 Major depressive disorder, recurrent severe without psychotic features: Secondary | ICD-10-CM

## 2016-08-09 DIAGNOSIS — F411 Generalized anxiety disorder: Secondary | ICD-10-CM | POA: Diagnosis not present

## 2016-08-15 ENCOUNTER — Ambulatory Visit: Payer: 59 | Admitting: Psychology

## 2016-08-15 ENCOUNTER — Ambulatory Visit (INDEPENDENT_AMBULATORY_CARE_PROVIDER_SITE_OTHER): Payer: 59 | Admitting: Psychology

## 2016-08-15 DIAGNOSIS — F411 Generalized anxiety disorder: Secondary | ICD-10-CM

## 2016-08-15 DIAGNOSIS — F332 Major depressive disorder, recurrent severe without psychotic features: Secondary | ICD-10-CM

## 2016-08-20 ENCOUNTER — Ambulatory Visit (INDEPENDENT_AMBULATORY_CARE_PROVIDER_SITE_OTHER): Payer: 59 | Admitting: Psychology

## 2016-08-20 DIAGNOSIS — F332 Major depressive disorder, recurrent severe without psychotic features: Secondary | ICD-10-CM

## 2016-08-20 DIAGNOSIS — F411 Generalized anxiety disorder: Secondary | ICD-10-CM | POA: Diagnosis not present

## 2016-08-22 ENCOUNTER — Ambulatory Visit (INDEPENDENT_AMBULATORY_CARE_PROVIDER_SITE_OTHER): Payer: 59 | Admitting: Psychology

## 2016-08-22 DIAGNOSIS — F332 Major depressive disorder, recurrent severe without psychotic features: Secondary | ICD-10-CM | POA: Diagnosis not present

## 2016-08-22 DIAGNOSIS — F411 Generalized anxiety disorder: Secondary | ICD-10-CM | POA: Diagnosis not present

## 2016-08-29 ENCOUNTER — Ambulatory Visit (INDEPENDENT_AMBULATORY_CARE_PROVIDER_SITE_OTHER): Payer: 59 | Admitting: Psychology

## 2016-08-29 DIAGNOSIS — F332 Major depressive disorder, recurrent severe without psychotic features: Secondary | ICD-10-CM | POA: Diagnosis not present

## 2016-08-29 DIAGNOSIS — F411 Generalized anxiety disorder: Secondary | ICD-10-CM

## 2016-09-05 ENCOUNTER — Ambulatory Visit: Payer: 59 | Admitting: Psychology

## 2016-09-12 ENCOUNTER — Ambulatory Visit (INDEPENDENT_AMBULATORY_CARE_PROVIDER_SITE_OTHER): Payer: 59 | Admitting: Psychology

## 2016-09-12 DIAGNOSIS — F332 Major depressive disorder, recurrent severe without psychotic features: Secondary | ICD-10-CM

## 2016-09-12 DIAGNOSIS — F411 Generalized anxiety disorder: Secondary | ICD-10-CM

## 2016-09-18 ENCOUNTER — Ambulatory Visit (INDEPENDENT_AMBULATORY_CARE_PROVIDER_SITE_OTHER): Payer: 59 | Admitting: Psychology

## 2016-09-18 DIAGNOSIS — F4323 Adjustment disorder with mixed anxiety and depressed mood: Secondary | ICD-10-CM | POA: Diagnosis not present

## 2016-09-19 ENCOUNTER — Ambulatory Visit: Payer: 59 | Admitting: Psychology

## 2016-09-25 ENCOUNTER — Ambulatory Visit (INDEPENDENT_AMBULATORY_CARE_PROVIDER_SITE_OTHER): Payer: 59 | Admitting: Psychology

## 2016-09-25 DIAGNOSIS — F411 Generalized anxiety disorder: Secondary | ICD-10-CM

## 2016-09-25 DIAGNOSIS — F332 Major depressive disorder, recurrent severe without psychotic features: Secondary | ICD-10-CM | POA: Diagnosis not present

## 2016-10-03 ENCOUNTER — Ambulatory Visit (INDEPENDENT_AMBULATORY_CARE_PROVIDER_SITE_OTHER): Payer: 59 | Admitting: Psychology

## 2016-10-03 DIAGNOSIS — F411 Generalized anxiety disorder: Secondary | ICD-10-CM

## 2016-10-03 DIAGNOSIS — F332 Major depressive disorder, recurrent severe without psychotic features: Secondary | ICD-10-CM | POA: Diagnosis not present

## 2016-10-10 ENCOUNTER — Ambulatory Visit (INDEPENDENT_AMBULATORY_CARE_PROVIDER_SITE_OTHER): Payer: 59 | Admitting: Psychology

## 2016-10-10 DIAGNOSIS — F332 Major depressive disorder, recurrent severe without psychotic features: Secondary | ICD-10-CM

## 2016-10-10 DIAGNOSIS — F411 Generalized anxiety disorder: Secondary | ICD-10-CM

## 2016-10-17 ENCOUNTER — Ambulatory Visit: Payer: 59 | Admitting: Psychology

## 2016-10-24 ENCOUNTER — Ambulatory Visit (INDEPENDENT_AMBULATORY_CARE_PROVIDER_SITE_OTHER): Payer: 59 | Admitting: Psychology

## 2016-10-24 DIAGNOSIS — F332 Major depressive disorder, recurrent severe without psychotic features: Secondary | ICD-10-CM | POA: Diagnosis not present

## 2016-10-24 DIAGNOSIS — F411 Generalized anxiety disorder: Secondary | ICD-10-CM

## 2016-10-31 ENCOUNTER — Ambulatory Visit: Payer: 59 | Admitting: Psychology

## 2016-11-14 ENCOUNTER — Ambulatory Visit (INDEPENDENT_AMBULATORY_CARE_PROVIDER_SITE_OTHER): Payer: 59 | Admitting: Psychology

## 2016-11-14 DIAGNOSIS — F411 Generalized anxiety disorder: Secondary | ICD-10-CM

## 2016-11-14 DIAGNOSIS — F332 Major depressive disorder, recurrent severe without psychotic features: Secondary | ICD-10-CM

## 2016-11-21 ENCOUNTER — Ambulatory Visit (INDEPENDENT_AMBULATORY_CARE_PROVIDER_SITE_OTHER): Payer: 59 | Admitting: Psychology

## 2016-11-21 DIAGNOSIS — F411 Generalized anxiety disorder: Secondary | ICD-10-CM

## 2016-11-21 DIAGNOSIS — F332 Major depressive disorder, recurrent severe without psychotic features: Secondary | ICD-10-CM | POA: Diagnosis not present

## 2016-11-28 ENCOUNTER — Ambulatory Visit (INDEPENDENT_AMBULATORY_CARE_PROVIDER_SITE_OTHER): Payer: 59 | Admitting: Psychology

## 2016-11-28 DIAGNOSIS — F332 Major depressive disorder, recurrent severe without psychotic features: Secondary | ICD-10-CM | POA: Diagnosis not present

## 2016-11-28 DIAGNOSIS — F411 Generalized anxiety disorder: Secondary | ICD-10-CM | POA: Diagnosis not present

## 2016-11-29 ENCOUNTER — Ambulatory Visit: Payer: Self-pay | Admitting: Hematology & Oncology

## 2016-11-29 ENCOUNTER — Other Ambulatory Visit: Payer: Self-pay

## 2016-12-04 ENCOUNTER — Other Ambulatory Visit (HOSPITAL_BASED_OUTPATIENT_CLINIC_OR_DEPARTMENT_OTHER): Payer: 59

## 2016-12-04 ENCOUNTER — Ambulatory Visit (HOSPITAL_BASED_OUTPATIENT_CLINIC_OR_DEPARTMENT_OTHER): Payer: 59 | Admitting: Hematology & Oncology

## 2016-12-04 VITALS — BP 103/62 | HR 74 | Temp 98.4°F | Resp 18 | Wt 140.0 lb

## 2016-12-04 DIAGNOSIS — E559 Vitamin D deficiency, unspecified: Secondary | ICD-10-CM | POA: Diagnosis not present

## 2016-12-04 DIAGNOSIS — M818 Other osteoporosis without current pathological fracture: Secondary | ICD-10-CM

## 2016-12-04 DIAGNOSIS — D0512 Intraductal carcinoma in situ of left breast: Secondary | ICD-10-CM

## 2016-12-04 DIAGNOSIS — F064 Anxiety disorder due to known physiological condition: Secondary | ICD-10-CM

## 2016-12-04 DIAGNOSIS — E7801 Familial hypercholesterolemia: Secondary | ICD-10-CM

## 2016-12-04 DIAGNOSIS — T386X5A Adverse effect of antigonadotrophins, antiestrogens, antiandrogens, not elsewhere classified, initial encounter: Secondary | ICD-10-CM

## 2016-12-04 DIAGNOSIS — D508 Other iron deficiency anemias: Secondary | ICD-10-CM

## 2016-12-04 DIAGNOSIS — R739 Hyperglycemia, unspecified: Secondary | ICD-10-CM

## 2016-12-04 DIAGNOSIS — D509 Iron deficiency anemia, unspecified: Secondary | ICD-10-CM

## 2016-12-04 DIAGNOSIS — E782 Mixed hyperlipidemia: Secondary | ICD-10-CM

## 2016-12-04 DIAGNOSIS — M810 Age-related osteoporosis without current pathological fracture: Secondary | ICD-10-CM

## 2016-12-04 LAB — IRON AND TIBC
%SAT: 25 % (ref 21–57)
IRON: 107 ug/dL (ref 41–142)
TIBC: 431 ug/dL (ref 236–444)
UIBC: 324 ug/dL (ref 120–384)

## 2016-12-04 LAB — CBC WITH DIFFERENTIAL (CANCER CENTER ONLY)
BASO#: 0 10*3/uL (ref 0.0–0.2)
BASO%: 0.5 % (ref 0.0–2.0)
EOS%: 2.5 % (ref 0.0–7.0)
Eosinophils Absolute: 0.1 10*3/uL (ref 0.0–0.5)
HEMATOCRIT: 42.4 % (ref 34.8–46.6)
HEMOGLOBIN: 14.5 g/dL (ref 11.6–15.9)
LYMPH#: 1.3 10*3/uL (ref 0.9–3.3)
LYMPH%: 36.6 % (ref 14.0–48.0)
MCH: 29 pg (ref 26.0–34.0)
MCHC: 34.2 g/dL (ref 32.0–36.0)
MCV: 85 fL (ref 81–101)
MONO#: 0.4 10*3/uL (ref 0.1–0.9)
MONO%: 11.2 % (ref 0.0–13.0)
NEUT%: 49.2 % (ref 39.6–80.0)
NEUTROS ABS: 1.8 10*3/uL (ref 1.5–6.5)
Platelets: 199 10*3/uL (ref 145–400)
RBC: 5 10*6/uL (ref 3.70–5.32)
RDW: 13.9 % (ref 11.1–15.7)
WBC: 3.7 10*3/uL — ABNORMAL LOW (ref 3.9–10.0)

## 2016-12-04 LAB — COMPREHENSIVE METABOLIC PANEL
ALT: 10 U/L (ref 0–55)
ANION GAP: 9 meq/L (ref 3–11)
AST: 17 U/L (ref 5–34)
Albumin: 3.7 g/dL (ref 3.5–5.0)
Alkaline Phosphatase: 64 U/L (ref 40–150)
BILIRUBIN TOTAL: 0.73 mg/dL (ref 0.20–1.20)
BUN: 13.2 mg/dL (ref 7.0–26.0)
CO2: 25 meq/L (ref 22–29)
CREATININE: 0.7 mg/dL (ref 0.6–1.1)
Calcium: 9.3 mg/dL (ref 8.4–10.4)
Chloride: 106 mEq/L (ref 98–109)
EGFR: >90 mL/min/{1.73_m2} (ref >90)
Glucose: 89 mg/dl (ref 70–140)
Potassium: 4.2 mEq/L (ref 3.5–5.1)
SODIUM: 140 meq/L (ref 136–145)
TOTAL PROTEIN: 6.7 g/dL (ref 6.4–8.3)

## 2016-12-04 LAB — FERRITIN: Ferritin: 18 ng/ml (ref 9–269)

## 2016-12-04 MED ORDER — ALPRAZOLAM 0.25 MG PO TABS: 0.2500 mg | ORAL_TABLET | Freq: Three times a day (TID) | ORAL | 0 refills | Status: DC | PRN

## 2016-12-04 NOTE — Progress Notes (Signed)
Hematology and Oncology Follow Up Visit  Pamela Ray [id number] [birth year] 51 y.o. 12/04/2016   Principle Diagnosis:   DCIS of the left breast  Recurrent iron deficiency anemia  Current Therapy:    IV iron with Feraheme as indicated - last dose was May 2017  Fareston 60mg  po q day - to finish up 5 years in December 2018     Interim History:  Ms.  Ray is back for followup. She is holding her own. She is still working. She works for [employer]. She has she went on to [place] a month ago. She said it wasn't too cold up there.  She is still dealing with the fingernail issue. She has a fungal infection. The dermatologist thinks that surgery was necessary. She does not want to go through surgery.  She last iron 6 months ago. Her iron studies at that time showed a ferritin of 13 with iron saturation of 12%.  Her vitamin D level has been on the low side. We last saw her, it was 27.5. She is on vitamin D.   She's had no problem with infections. She's had no influenza.   She does walk. She does like to hike. She has to mow her yard today. She is not looking for to this.   She wants [place] to check her lipid profile and hemoglobin A1c. This is for work.   Overall, her performance status is ECOG 0.  Medications:  Current Outpatient Prescriptions:  .  ALPRAZolam (XANAX) 0.25 MG tablet, Take 1 tablet (0.25 mg total) by mouth 3 (three) times daily as needed for anxiety., Disp: 20 tablet, Rfl: 0 .  BOTOX 100 UNITS SOLR, Every 3 months, Disp: , Rfl:  .  desonide (DESOWEN) 0.05 % ointment, Apply 1 application topically 2 (two) times daily. , Disp: , Rfl:  .  docusate sodium (COLACE) 100 MG capsule, Take 100 mg by mouth as needed for mild constipation., Disp: , Rfl:  .  ergocalciferol (VITAMIN D2) 50000 units capsule, Take 1 capsule (50,000 Units total) by mouth 2 (two) times a week., Disp: 12 capsule, Rfl: 3 .  ibuprofen (ADVIL,MOTRIN) 200 MG tablet, Take 200 mg by mouth every 6 (six)  hours as needed., Disp: , Rfl:  .  metaxalone (SKELAXIN) 800 MG tablet, Take 800 mg by mouth 2 (two) times daily as needed. For migraines, Disp: , Rfl:  .  PRESCRIPTION MEDICATION, Apply 1 application topically 2 (two) times daily as needed (Cortisone). Eczema cream, Disp: , Rfl:  .  SUMAtriptan (IMITREX) 100 MG tablet, Take 100 mg by mouth every 2 (two) hours as needed. Take 1 tablet at onset of migraine  may repeat in 2 hours  Limit use to 2 days a week , Disp: , Rfl:  .  Toremifene Citrate (FARESTON) 60 MG tablet, Take 1 tablet (60 mg total) by mouth daily. 90 day supply, Disp: 90 tablet, Rfl: 3 .  vitamin A 10000 UNIT capsule, Take 10,000 Units by mouth daily., Disp: , Rfl:   Allergies:  Allergies  Allergen Reactions  . Amoxicillin Itching  . Cephalexin Itching  . Cephalexin Itching  . Penicillins Hives    Past Medical History, Surgical history, Social history, and Family History were reviewed and updated.  Review of Systems: As above  Physical Exam:  weight is 140 lb (63.5 kg). Her oral temperature is 98.4 F (36.9 C). Her blood pressure is 103/62 and her pulse is 74. Her respiration is 18 and oxygen saturation is 98%.  Well-developed and well-nourished white female. Head and neck exam shows no ocular or oral lesions. She has no periorbital edema. There is no intraoral lesions. She no adenopathy in the neck. Thyroid is not double. Lungs are clear. Cardiac exam regular rate and rhythm with no murmurs rubs or bruits. Breast exam shows right breast with no masses edema or erythema. There is no right axillary adenopathy. Left breast shows some slight hyperpigmentation from radiation. She has a well-healed lumpectomy scar at about the 2:00 position. There is no mass in the left breast. There is no left axillary adenopathy. Abdomen is soft. She has good bowel sounds. There is no fluid wave. No palpable liver or spleen tip. Back exam shows no tenderness over the spine ribs or hips.  Extremities shows no clubbing cyanosis or edema. She has the periungual wart on the medial aspect of the right thumb..  Lab Results  Component Value Date   WBC 3.7 (L) 12/04/2016   HGB 14.5 12/04/2016   HCT 42.4 12/04/2016   MCV 85 12/04/2016   PLT 199 12/04/2016     Chemistry      Component Value Date/Time   NA 138 05/30/2016 0746   K 4.2 05/30/2016 0746   CL 108 05/19/2015 0250   CL 103 12/03/2013 1319   CO2 22 05/30/2016 0746   BUN 18.7 05/30/2016 0746   CREATININE 0.8 05/30/2016 0746      Component Value Date/Time   CALCIUM 9.2 05/30/2016 0746   ALKPHOS 66 05/30/2016 0746   AST 15 05/30/2016 0746   ALT 8 05/30/2016 0746   BILITOT 0.60 05/30/2016 0746       Impression and Plan: Pamela Ray is 51 year old white female. She is a history of ductal carcinoma in situ of the left breast. She was diagnosed back in December of 2013. She underwent radiation. She is on Fareston We will keep her on 5 years of Fareston.  Pamela Ray is truly a survivor. She has been through a lot of tough times. However, she will rise up and do well again. She is very profound with her thoughts. It is always somewhat fun talking with her.  I know that she has to good children who really will watch out for her. Hopefully when they go camping for Mother's Day, he will be a nice weekend for this.  We will see what her labs look like. I will call her with the results.  If all looks good, we will get her back in 6 months and then hopefully get her off the Fareston.   Pamela Riggs, MD 4/18/20188:44 AM

## 2016-12-05 ENCOUNTER — Ambulatory Visit (INDEPENDENT_AMBULATORY_CARE_PROVIDER_SITE_OTHER): Payer: 59 | Admitting: Psychology

## 2016-12-05 ENCOUNTER — Encounter: Payer: Self-pay | Admitting: *Deleted

## 2016-12-05 DIAGNOSIS — F332 Major depressive disorder, recurrent severe without psychotic features: Secondary | ICD-10-CM | POA: Diagnosis not present

## 2016-12-05 DIAGNOSIS — F411 Generalized anxiety disorder: Secondary | ICD-10-CM

## 2016-12-05 LAB — LIPID PANEL
CHOL/HDL RATIO: 3.7 ratio (ref 0.0–4.4)
Cholesterol, Total: 229 mg/dL — ABNORMAL HIGH (ref 100–199)
HDL: 62 mg/dL (ref >39)
LDL Calculated: 144 mg/dL — ABNORMAL HIGH (ref 0–99)
Triglycerides: 114 mg/dL (ref 0–149)
VLDL CHOLESTEROL CAL: 23 mg/dL (ref 5–40)

## 2016-12-05 LAB — HEMOGLOBIN A1C
ESTIMATED AVERAGE GLUCOSE: 103 mg/dL
Hemoglobin A1c: 5.2 % (ref 4.8–5.6)

## 2016-12-05 LAB — VITAMIN D 25 HYDROXY (VIT D DEFICIENCY, FRACTURES): VIT D 25 HYDROXY: 32.6 ng/mL (ref 30.0–100.0)

## 2016-12-06 ENCOUNTER — Telehealth: Payer: Self-pay | Admitting: *Deleted

## 2016-12-06 NOTE — Telephone Encounter (Addendum)
Patient aware of results. She understands recommendations. At this time patient doesn't have a PCP. She is working on establishing care with a new physician.   ----- Message from Volanda Napoleon, MD sent at 12/05/2016  6:46 PM EDT ----- Call - vit D is ok!!  Hemoglobin A1C is fine. The cholesterol is too high!!! Please send this to her family MD.  She needs to take fish oil and red rice yeast to get her cholesterol down!!  Exercising will help!!!  Watch your diet!!!  Laurey Arrow

## 2016-12-12 ENCOUNTER — Ambulatory Visit (INDEPENDENT_AMBULATORY_CARE_PROVIDER_SITE_OTHER): Payer: 59 | Admitting: Psychology

## 2016-12-12 DIAGNOSIS — F332 Major depressive disorder, recurrent severe without psychotic features: Secondary | ICD-10-CM

## 2016-12-12 DIAGNOSIS — F411 Generalized anxiety disorder: Secondary | ICD-10-CM

## 2016-12-19 ENCOUNTER — Ambulatory Visit (INDEPENDENT_AMBULATORY_CARE_PROVIDER_SITE_OTHER): Payer: 59 | Admitting: Psychology

## 2016-12-19 DIAGNOSIS — F332 Major depressive disorder, recurrent severe without psychotic features: Secondary | ICD-10-CM | POA: Diagnosis not present

## 2016-12-19 DIAGNOSIS — F411 Generalized anxiety disorder: Secondary | ICD-10-CM

## 2016-12-26 ENCOUNTER — Ambulatory Visit (INDEPENDENT_AMBULATORY_CARE_PROVIDER_SITE_OTHER): Payer: 59 | Admitting: Psychology

## 2016-12-26 DIAGNOSIS — F332 Major depressive disorder, recurrent severe without psychotic features: Secondary | ICD-10-CM | POA: Diagnosis not present

## 2016-12-26 DIAGNOSIS — F411 Generalized anxiety disorder: Secondary | ICD-10-CM | POA: Diagnosis not present

## 2017-01-02 ENCOUNTER — Ambulatory Visit (INDEPENDENT_AMBULATORY_CARE_PROVIDER_SITE_OTHER): Payer: 59 | Admitting: Psychology

## 2017-01-02 DIAGNOSIS — F332 Major depressive disorder, recurrent severe without psychotic features: Secondary | ICD-10-CM

## 2017-01-02 DIAGNOSIS — F411 Generalized anxiety disorder: Secondary | ICD-10-CM | POA: Diagnosis not present

## 2017-01-09 ENCOUNTER — Ambulatory Visit (INDEPENDENT_AMBULATORY_CARE_PROVIDER_SITE_OTHER): Payer: 59 | Admitting: Psychology

## 2017-01-09 DIAGNOSIS — F411 Generalized anxiety disorder: Secondary | ICD-10-CM | POA: Diagnosis not present

## 2017-01-09 DIAGNOSIS — F332 Major depressive disorder, recurrent severe without psychotic features: Secondary | ICD-10-CM

## 2017-01-16 ENCOUNTER — Ambulatory Visit (INDEPENDENT_AMBULATORY_CARE_PROVIDER_SITE_OTHER): Payer: 59 | Admitting: Psychology

## 2017-01-16 DIAGNOSIS — F411 Generalized anxiety disorder: Secondary | ICD-10-CM

## 2017-01-16 DIAGNOSIS — F332 Major depressive disorder, recurrent severe without psychotic features: Secondary | ICD-10-CM | POA: Diagnosis not present

## 2017-01-23 ENCOUNTER — Ambulatory Visit (INDEPENDENT_AMBULATORY_CARE_PROVIDER_SITE_OTHER): Payer: 59 | Admitting: Psychology

## 2017-01-23 DIAGNOSIS — F411 Generalized anxiety disorder: Secondary | ICD-10-CM | POA: Diagnosis not present

## 2017-01-23 DIAGNOSIS — F332 Major depressive disorder, recurrent severe without psychotic features: Secondary | ICD-10-CM | POA: Diagnosis not present

## 2017-01-27 ENCOUNTER — Ambulatory Visit: Payer: 59 | Admitting: Psychology

## 2017-02-06 ENCOUNTER — Ambulatory Visit (INDEPENDENT_AMBULATORY_CARE_PROVIDER_SITE_OTHER): Payer: 59 | Admitting: Psychology

## 2017-02-06 DIAGNOSIS — F411 Generalized anxiety disorder: Secondary | ICD-10-CM

## 2017-02-06 DIAGNOSIS — F332 Major depressive disorder, recurrent severe without psychotic features: Secondary | ICD-10-CM

## 2017-02-13 ENCOUNTER — Ambulatory Visit (INDEPENDENT_AMBULATORY_CARE_PROVIDER_SITE_OTHER): Payer: 59 | Admitting: Psychology

## 2017-02-13 DIAGNOSIS — F332 Major depressive disorder, recurrent severe without psychotic features: Secondary | ICD-10-CM

## 2017-02-13 DIAGNOSIS — F411 Generalized anxiety disorder: Secondary | ICD-10-CM

## 2017-02-27 ENCOUNTER — Ambulatory Visit: Payer: 59 | Admitting: Psychology

## 2017-03-06 ENCOUNTER — Ambulatory Visit (INDEPENDENT_AMBULATORY_CARE_PROVIDER_SITE_OTHER): Payer: 59 | Admitting: Psychology

## 2017-03-06 DIAGNOSIS — F332 Major depressive disorder, recurrent severe without psychotic features: Secondary | ICD-10-CM | POA: Diagnosis not present

## 2017-03-06 DIAGNOSIS — F411 Generalized anxiety disorder: Secondary | ICD-10-CM

## 2017-03-13 ENCOUNTER — Ambulatory Visit (INDEPENDENT_AMBULATORY_CARE_PROVIDER_SITE_OTHER): Payer: 59 | Admitting: Psychology

## 2017-03-13 DIAGNOSIS — F411 Generalized anxiety disorder: Secondary | ICD-10-CM | POA: Diagnosis not present

## 2017-03-13 DIAGNOSIS — F332 Major depressive disorder, recurrent severe without psychotic features: Secondary | ICD-10-CM

## 2017-03-20 ENCOUNTER — Ambulatory Visit (INDEPENDENT_AMBULATORY_CARE_PROVIDER_SITE_OTHER): Payer: 59 | Admitting: Psychology

## 2017-03-20 DIAGNOSIS — F332 Major depressive disorder, recurrent severe without psychotic features: Secondary | ICD-10-CM | POA: Diagnosis not present

## 2017-03-20 DIAGNOSIS — F411 Generalized anxiety disorder: Secondary | ICD-10-CM | POA: Diagnosis not present

## 2017-03-27 ENCOUNTER — Ambulatory Visit (INDEPENDENT_AMBULATORY_CARE_PROVIDER_SITE_OTHER): Payer: 59 | Admitting: Psychology

## 2017-03-27 DIAGNOSIS — F411 Generalized anxiety disorder: Secondary | ICD-10-CM | POA: Diagnosis not present

## 2017-03-27 DIAGNOSIS — F332 Major depressive disorder, recurrent severe without psychotic features: Secondary | ICD-10-CM

## 2017-04-03 ENCOUNTER — Ambulatory Visit: Payer: 59 | Admitting: Psychology

## 2017-04-10 ENCOUNTER — Ambulatory Visit: Payer: 59 | Admitting: Psychology

## 2017-04-17 ENCOUNTER — Ambulatory Visit: Payer: Self-pay | Admitting: Psychology

## 2017-04-24 ENCOUNTER — Ambulatory Visit (INDEPENDENT_AMBULATORY_CARE_PROVIDER_SITE_OTHER): Payer: 59 | Admitting: Psychology

## 2017-04-24 DIAGNOSIS — F332 Major depressive disorder, recurrent severe without psychotic features: Secondary | ICD-10-CM | POA: Diagnosis not present

## 2017-04-24 DIAGNOSIS — F411 Generalized anxiety disorder: Secondary | ICD-10-CM

## 2017-05-01 ENCOUNTER — Ambulatory Visit: Payer: Self-pay | Admitting: Psychology

## 2017-05-08 ENCOUNTER — Ambulatory Visit: Payer: Self-pay | Admitting: Psychology

## 2017-05-15 ENCOUNTER — Ambulatory Visit: Payer: Self-pay | Admitting: Psychology

## 2017-05-22 ENCOUNTER — Ambulatory Visit (INDEPENDENT_AMBULATORY_CARE_PROVIDER_SITE_OTHER): Payer: 59 | Admitting: Psychology

## 2017-05-22 DIAGNOSIS — F411 Generalized anxiety disorder: Secondary | ICD-10-CM

## 2017-05-22 DIAGNOSIS — F332 Major depressive disorder, recurrent severe without psychotic features: Secondary | ICD-10-CM | POA: Diagnosis not present

## 2017-05-29 ENCOUNTER — Ambulatory Visit: Payer: Self-pay | Admitting: Psychology

## 2017-06-04 ENCOUNTER — Ambulatory Visit: Payer: Self-pay | Admitting: Hematology & Oncology

## 2017-06-04 ENCOUNTER — Encounter: Payer: Self-pay | Admitting: Hematology & Oncology

## 2017-06-04 ENCOUNTER — Other Ambulatory Visit: Payer: Self-pay

## 2017-06-05 ENCOUNTER — Ambulatory Visit (INDEPENDENT_AMBULATORY_CARE_PROVIDER_SITE_OTHER): Payer: 59 | Admitting: Psychology

## 2017-06-05 DIAGNOSIS — F332 Major depressive disorder, recurrent severe without psychotic features: Secondary | ICD-10-CM | POA: Diagnosis not present

## 2017-06-05 DIAGNOSIS — F411 Generalized anxiety disorder: Secondary | ICD-10-CM | POA: Diagnosis not present

## 2017-06-06 ENCOUNTER — Other Ambulatory Visit (HOSPITAL_BASED_OUTPATIENT_CLINIC_OR_DEPARTMENT_OTHER): Payer: 59

## 2017-06-06 ENCOUNTER — Encounter: Payer: Self-pay | Admitting: *Deleted

## 2017-06-06 ENCOUNTER — Other Ambulatory Visit: Payer: Self-pay

## 2017-06-06 DIAGNOSIS — E7801 Familial hypercholesterolemia: Secondary | ICD-10-CM

## 2017-06-06 DIAGNOSIS — D0512 Intraductal carcinoma in situ of left breast: Secondary | ICD-10-CM

## 2017-06-06 DIAGNOSIS — E782 Mixed hyperlipidemia: Secondary | ICD-10-CM

## 2017-06-06 DIAGNOSIS — T386X5A Adverse effect of antigonadotrophins, antiestrogens, antiandrogens, not elsewhere classified, initial encounter: Secondary | ICD-10-CM

## 2017-06-06 DIAGNOSIS — E559 Vitamin D deficiency, unspecified: Secondary | ICD-10-CM

## 2017-06-06 DIAGNOSIS — M810 Age-related osteoporosis without current pathological fracture: Secondary | ICD-10-CM

## 2017-06-06 DIAGNOSIS — M818 Other osteoporosis without current pathological fracture: Secondary | ICD-10-CM

## 2017-06-06 DIAGNOSIS — D508 Other iron deficiency anemias: Secondary | ICD-10-CM

## 2017-06-06 DIAGNOSIS — R739 Hyperglycemia, unspecified: Secondary | ICD-10-CM

## 2017-06-06 DIAGNOSIS — F064 Anxiety disorder due to known physiological condition: Secondary | ICD-10-CM

## 2017-06-06 DIAGNOSIS — D509 Iron deficiency anemia, unspecified: Secondary | ICD-10-CM | POA: Diagnosis not present

## 2017-06-06 LAB — CMP (CANCER CENTER ONLY)
ALK PHOS: 62 U/L (ref 26–84)
ALT: 25 U/L (ref 10–47)
AST: 24 U/L (ref 11–38)
Albumin: 3.3 g/dL (ref 3.3–5.5)
BUN: 13 mg/dL (ref 7–22)
CHLORIDE: 107 meq/L (ref 98–108)
CO2: 26 meq/L (ref 18–33)
Calcium: 9 mg/dL (ref 8.0–10.3)
Creat: 0.7 mg/dl (ref 0.6–1.2)
GLUCOSE: 101 mg/dL (ref 73–118)
POTASSIUM: 4.1 meq/L (ref 3.3–4.7)
Sodium: 138 mEq/L (ref 128–145)
Total Bilirubin: 1 mg/dl (ref 0.20–1.60)
Total Protein: 6.9 g/dL (ref 6.4–8.1)

## 2017-06-06 LAB — CBC WITH DIFFERENTIAL (CANCER CENTER ONLY)
BASO#: 0 10*3/uL (ref 0.0–0.2)
BASO%: 0.5 % (ref 0.0–2.0)
EOS%: 2.5 % (ref 0.0–7.0)
Eosinophils Absolute: 0.1 10*3/uL (ref 0.0–0.5)
HCT: 40.3 % (ref 34.8–46.6)
HGB: 13.7 g/dL (ref 11.6–15.9)
LYMPH#: 1.5 10*3/uL (ref 0.9–3.3)
LYMPH%: 26.4 % (ref 14.0–48.0)
MCH: 27.6 pg (ref 26.0–34.0)
MCHC: 34 g/dL (ref 32.0–36.0)
MCV: 81 fL (ref 81–101)
MONO#: 0.6 10*3/uL (ref 0.1–0.9)
MONO%: 11.5 % (ref 0.0–13.0)
NEUT#: 3.3 10*3/uL (ref 1.5–6.5)
NEUT%: 59.1 % (ref 39.6–80.0)
PLATELETS: 230 10*3/uL (ref 145–400)
RBC: 4.96 10*6/uL (ref 3.70–5.32)
RDW: 14.4 % (ref 11.1–15.7)
WBC: 5.5 10*3/uL (ref 3.9–10.0)

## 2017-06-06 LAB — FERRITIN: Ferritin: 12 ng/ml (ref 9–269)

## 2017-06-06 LAB — IRON AND TIBC
%SAT: 15 % — AB (ref 21–57)
Iron: 72 ug/dL (ref 41–142)
TIBC: 466 ug/dL — ABNORMAL HIGH (ref 236–444)
UIBC: 394 ug/dL — AB (ref 120–384)

## 2017-06-07 LAB — VITAMIN D 25 HYDROXY (VIT D DEFICIENCY, FRACTURES): Vitamin D, 25-Hydroxy: 19.4 ng/mL — ABNORMAL LOW (ref 30.0–100.0)

## 2017-06-10 IMAGING — CT CT ANGIO CHEST
2 of 6 series · 19 of 36 positions shown · IV contrast (APPLIED)
Comparison: None.

CLINICAL DATA: Chest discomfort during iron infusion. Left breast
cancer in 3016.

EXAM:
CT ANGIOGRAPHY CHEST WITH CONTRAST
TECHNIQUE: Multidetector CT imaging of the chest was performed using the
standard protocol during bolus administration of intravenous
contrast. Multiplanar CT image reconstructions and MIPs were
obtained to evaluate the vascular anatomy.
CONTRAST:  100mL OMNIPAQUE IOHEXOL 350 MG/ML SOLN

[Series 6: pe 1.0 b26f · axial · 0.66mm/px · z∈[-298,-3]mm · 18 of 329 slices shown]
[im 17/329  lung]
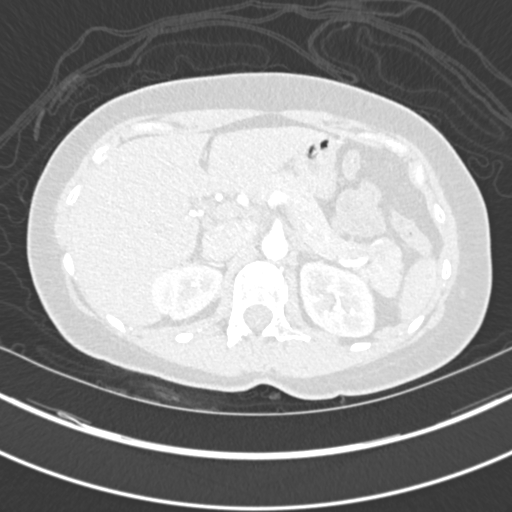
[im 33/329  mediastinal]
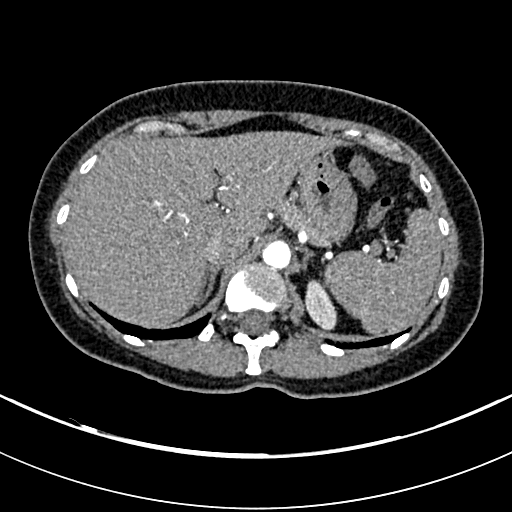
[im 50/329  lung]
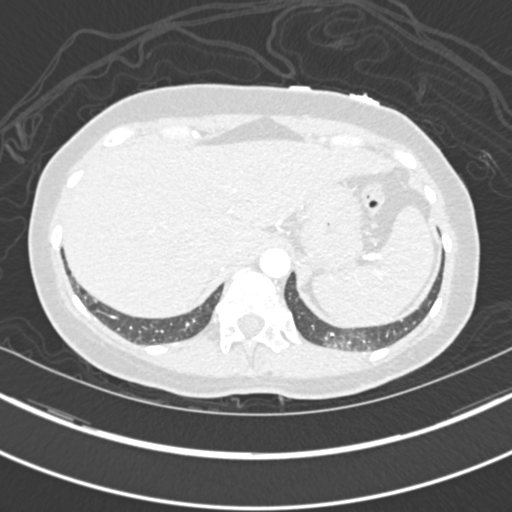
[im 66/329  mediastinal]
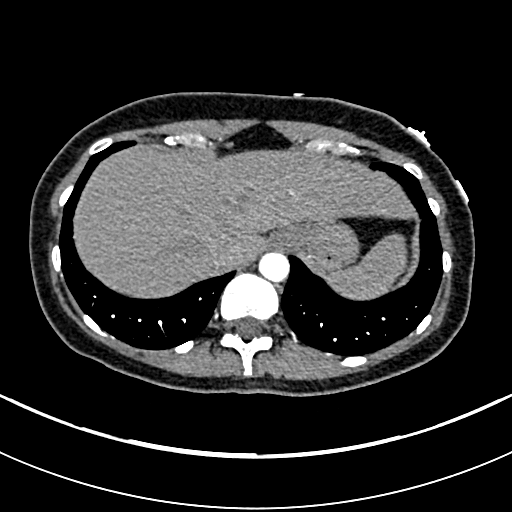
[im 83/329  lung]
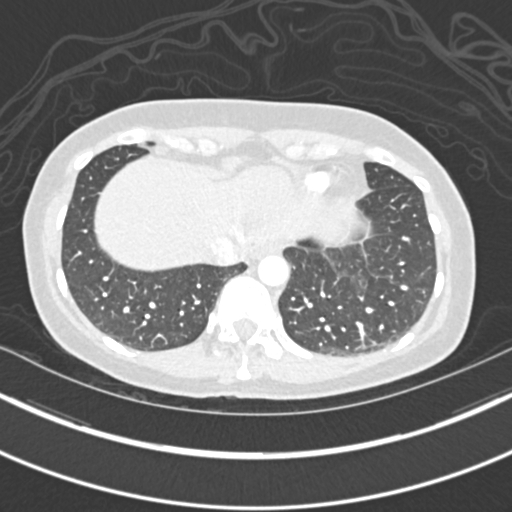
[im 99/329  mediastinal]
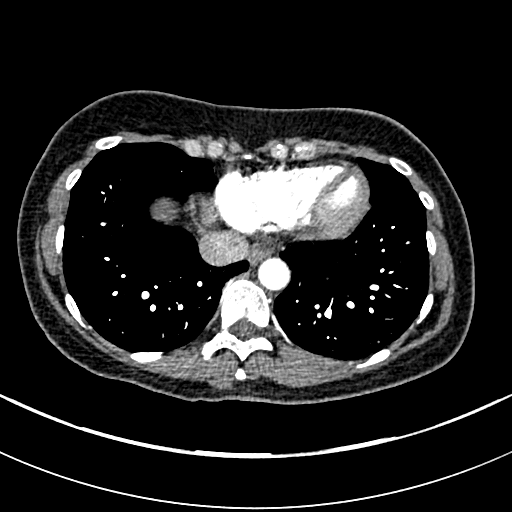
[im 115/329  lung]
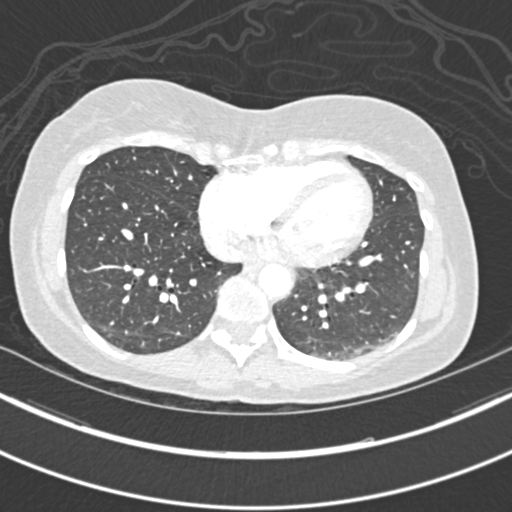
[im 132/329  mediastinal]
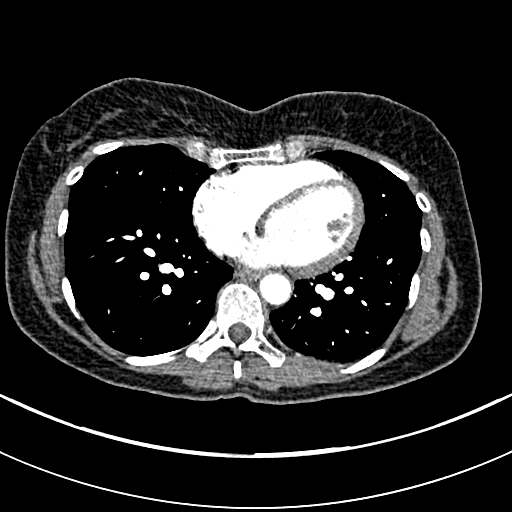
[im 148/329  lung]
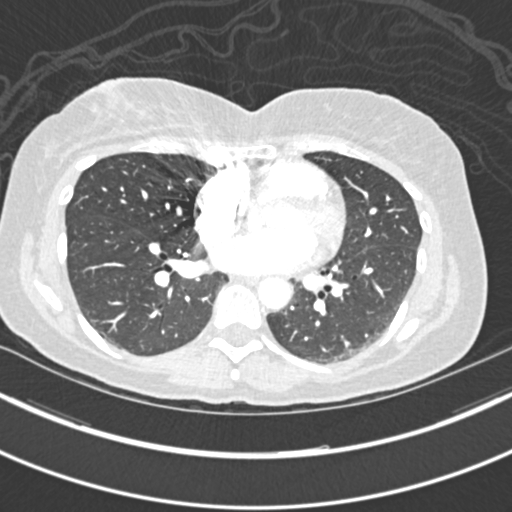
[im 181/329  mediastinal]
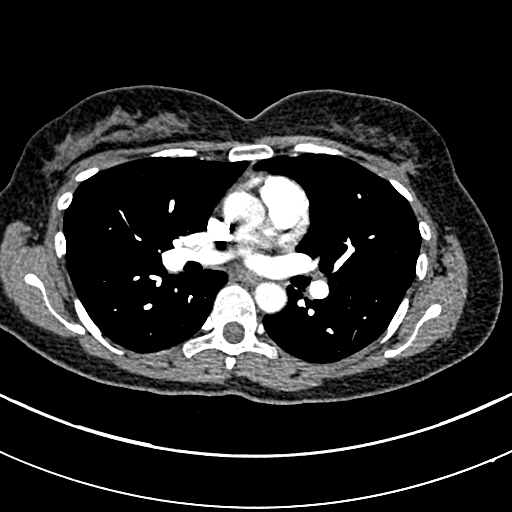
[im 197/329  lung]
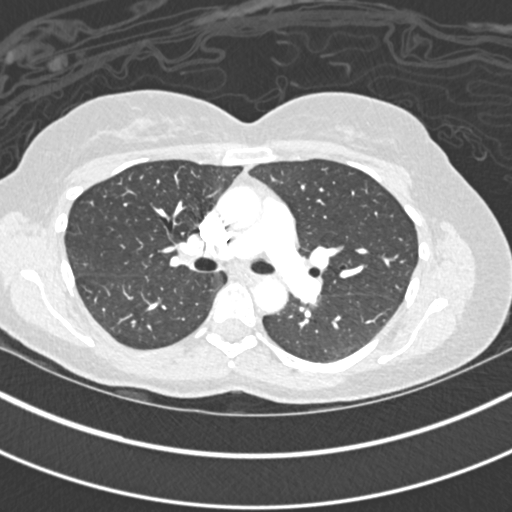
[im 214/329  mediastinal]
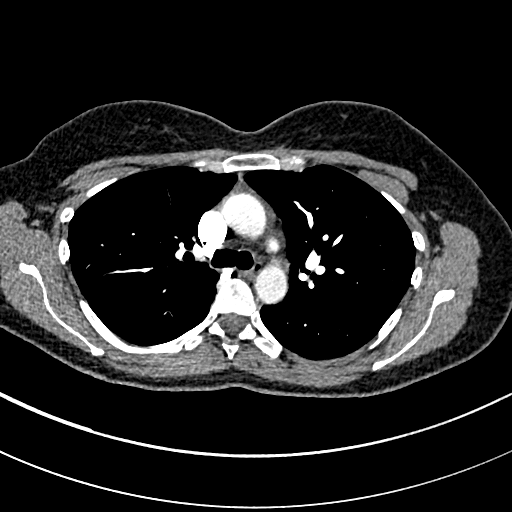
[im 230/329  lung]
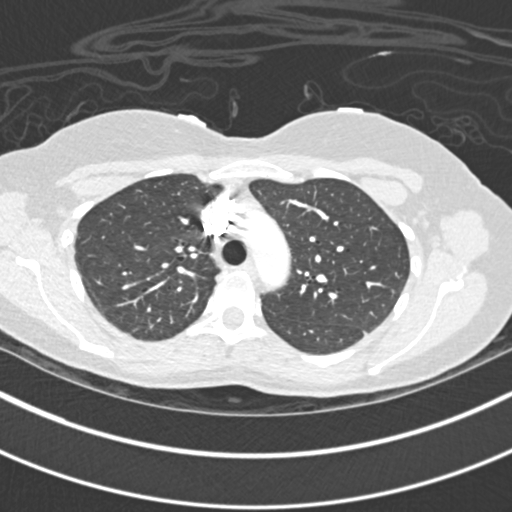
[im 246/329  mediastinal]
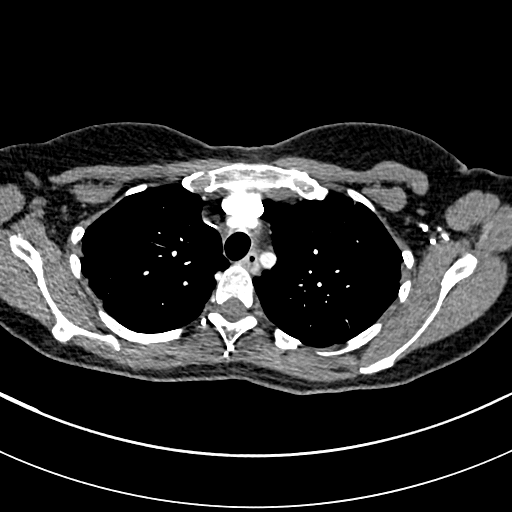
[im 263/329  lung]
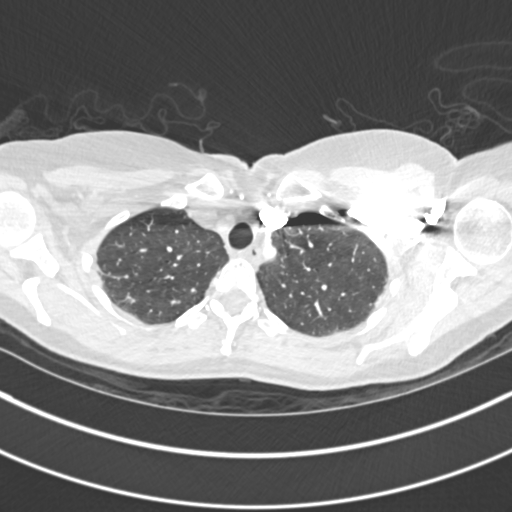
[im 279/329  mediastinal]
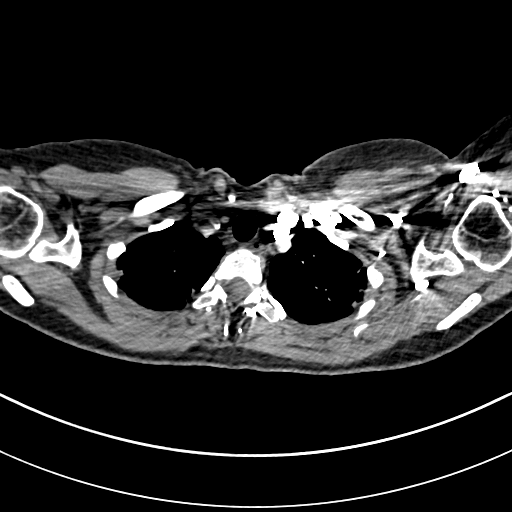
[im 296/329  lung]
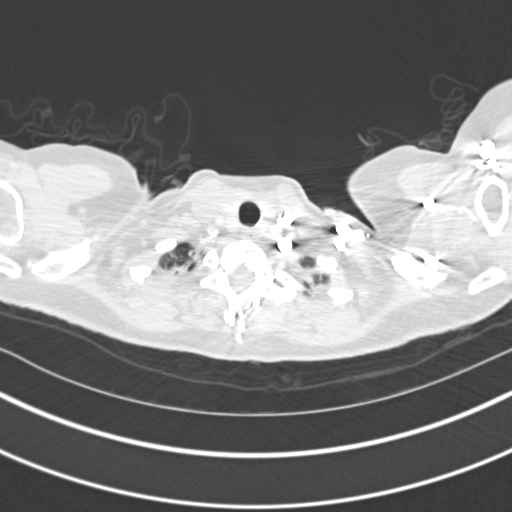
[im 312/329  mediastinal]
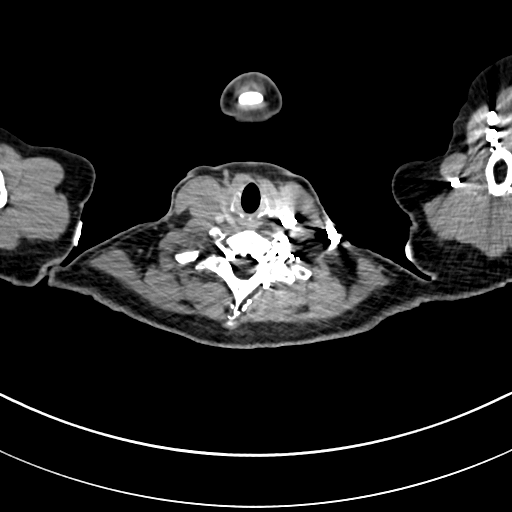

[Series 9: pe 2.0 coronal · coronal · 0.64mm/px · 1 of 92 slices shown]
[im 46/92  mediastinal]
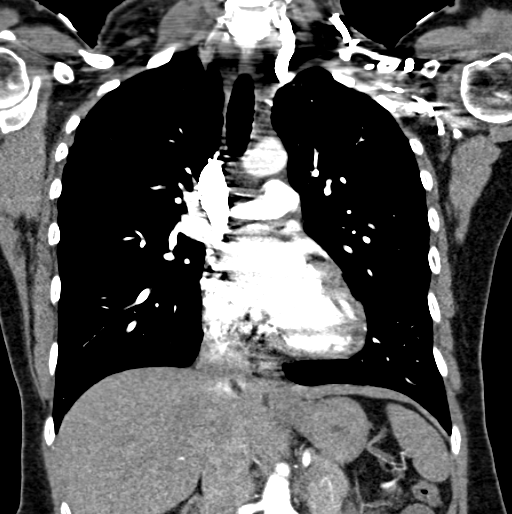

[19 of 36 positions shown; findings below may reference images not displayed]

FINDINGS: THORACIC INLET/BODY WALL:

Scar-like appearance of the left breast, presumably from lumpectomy.
No axillary adenopathy.

MEDIASTINUM:

Normal heart size. No pericardial effusion. No pulmonary filling
defect. Normal appearance of the aorta.

LUNG WINDOWS:

No consolidation.  No effusion.

Mild biapical pleural scarring.

UPPER ABDOMEN:

No acute findings.

OSSEOUS:

No acute fracture.  No suspicious lytic or blastic lesions.

Review of the MIP images confirms the above findings.
IMPRESSION: Negative for pulmonary embolism or other explanation for chest pain.

## 2017-06-12 ENCOUNTER — Ambulatory Visit (INDEPENDENT_AMBULATORY_CARE_PROVIDER_SITE_OTHER): Payer: 59 | Admitting: Psychology

## 2017-06-12 DIAGNOSIS — F332 Major depressive disorder, recurrent severe without psychotic features: Secondary | ICD-10-CM

## 2017-06-12 DIAGNOSIS — F411 Generalized anxiety disorder: Secondary | ICD-10-CM | POA: Diagnosis not present

## 2017-06-13 ENCOUNTER — Ambulatory Visit (HOSPITAL_BASED_OUTPATIENT_CLINIC_OR_DEPARTMENT_OTHER): Payer: 59 | Admitting: Family

## 2017-06-13 ENCOUNTER — Other Ambulatory Visit: Payer: Self-pay

## 2017-06-13 ENCOUNTER — Ambulatory Visit (HOSPITAL_BASED_OUTPATIENT_CLINIC_OR_DEPARTMENT_OTHER): Payer: 59

## 2017-06-13 VITALS — BP 102/66 | HR 85 | Temp 98.9°F | Resp 16 | Wt 147.0 lb

## 2017-06-13 DIAGNOSIS — R739 Hyperglycemia, unspecified: Secondary | ICD-10-CM

## 2017-06-13 DIAGNOSIS — F064 Anxiety disorder due to known physiological condition: Secondary | ICD-10-CM

## 2017-06-13 DIAGNOSIS — E559 Vitamin D deficiency, unspecified: Secondary | ICD-10-CM

## 2017-06-13 DIAGNOSIS — E7801 Familial hypercholesterolemia: Secondary | ICD-10-CM

## 2017-06-13 DIAGNOSIS — R5383 Other fatigue: Secondary | ICD-10-CM

## 2017-06-13 DIAGNOSIS — M818 Other osteoporosis without current pathological fracture: Secondary | ICD-10-CM

## 2017-06-13 DIAGNOSIS — Z8249 Family history of ischemic heart disease and other diseases of the circulatory system: Secondary | ICD-10-CM

## 2017-06-13 DIAGNOSIS — D508 Other iron deficiency anemias: Secondary | ICD-10-CM

## 2017-06-13 DIAGNOSIS — R0609 Other forms of dyspnea: Secondary | ICD-10-CM | POA: Diagnosis not present

## 2017-06-13 DIAGNOSIS — M810 Age-related osteoporosis without current pathological fracture: Secondary | ICD-10-CM

## 2017-06-13 DIAGNOSIS — D0512 Intraductal carcinoma in situ of left breast: Secondary | ICD-10-CM

## 2017-06-13 DIAGNOSIS — R002 Palpitations: Secondary | ICD-10-CM

## 2017-06-13 DIAGNOSIS — T386X5A Adverse effect of antigonadotrophins, antiestrogens, antiandrogens, not elsewhere classified, initial encounter: Secondary | ICD-10-CM

## 2017-06-13 DIAGNOSIS — D509 Iron deficiency anemia, unspecified: Secondary | ICD-10-CM

## 2017-06-13 DIAGNOSIS — E782 Mixed hyperlipidemia: Secondary | ICD-10-CM

## 2017-06-13 MED ORDER — ALPRAZOLAM 0.25 MG PO TABS: 0.2500 mg | ORAL_TABLET | Freq: Three times a day (TID) | ORAL | 0 refills | Status: DC | PRN

## 2017-06-13 MED ORDER — SODIUM CHLORIDE 0.9 % IV SOLN
510.0000 mg | Freq: Once | INTRAVENOUS | Status: AC
  Administered 2017-06-13: 510 mg via INTRAVENOUS
  Filled 2017-06-13: qty 17

## 2017-06-13 MED ORDER — METHYLPREDNISOLONE SODIUM SUCC 125 MG IJ SOLR
125.0000 mg | Freq: Once | INTRAMUSCULAR | Status: AC
  Administered 2017-06-13: 125 mg via INTRAVENOUS

## 2017-06-13 MED ORDER — SODIUM CHLORIDE 0.9 % IV SOLN
Freq: Once | INTRAVENOUS | Status: AC
  Administered 2017-06-13: 13:00:00 via INTRAVENOUS

## 2017-06-13 MED ORDER — METHYLPREDNISOLONE SODIUM SUCC 125 MG IJ SOLR
INTRAMUSCULAR | Status: AC
  Filled 2017-06-13: qty 2

## 2017-06-13 MED ORDER — ERGOCALCIFEROL 1.25 MG (50000 UT) PO CAPS: 50000.0000 [IU] | ORAL_CAPSULE | ORAL | 6 refills | Status: DC

## 2017-06-13 MED ORDER — FAMOTIDINE IN NACL 20-0.9 MG/50ML-% IV SOLN
INTRAVENOUS | Status: AC
  Filled 2017-06-13: qty 50

## 2017-06-13 MED ORDER — FAMOTIDINE IN NACL 20-0.9 MG/50ML-% IV SOLN
20.0000 mg | Freq: Once | INTRAVENOUS | Status: AC
  Administered 2017-06-13: 20 mg via INTRAVENOUS

## 2017-06-13 MED FILL — VIT D2 1.25 MG (50,000 UNIT: 1.25 MG | 27 days supply | Qty: 8 | Fill #0

## 2017-06-13 NOTE — Progress Notes (Signed)
Hematology and Oncology Follow Up Visit  Pamela Ray [id number] 1966-07-13 51 y.o. 06/13/2017   Principle Diagnosis:  DCIS of the left breast Recurrent iron deficiency anemia  Current Therapy:   IV iron with Feraheme as indicated - last dose was May 2017 Fareston 60mg  po q day    Interim History:  Pamela Ray is here today for follow-up. She is symptomatic with fatigue, SOB with exertion and palpitations. Her iron saturation is down at 15% and vitamin D is also low at 19. She will restart her vitamin D 50,000 units twice a week and we will give her IV iron today.  She is currently on her cycle and has a migraine. Her cycle is regular and not too heavy. This typically lasts 4 days.  She is under some stress, She has been separated from her husband for over a year and is waiting for him to file for divorce. She would like a refill on her xanax. She denies feelings of harming herself or others.  No fever, chills, n/v, cough, rash, dizziness, chest pain, abdominal pain or changes in bowel or bladder habits.  No swelling, tenderness, numbness or tingling in her extremities.  She has a good appetite and is staying well hydrated. Her weight is stable.   ECOG Performance Status: 1 - Symptomatic but completely ambulatory  Medications:  Allergies as of 06/13/2017      Reactions   Amoxicillin Itching   Cephalexin Itching   Cephalexin Itching   Penicillins Hives      Medication List       Accurate as of 06/13/17  1:02 PM. Always use your most recent med list.          ALPRAZolam 0.25 MG tablet Commonly known as:  XANAX Take 1 tablet (0.25 mg total) by mouth 3 (three) times daily as needed for anxiety.   BOTOX 100 units Solr injection Generic drug:  botulinum toxin Type A Every 3 months   ergocalciferol 50000 units capsule Commonly known as:  VITAMIN D2 Take 1 capsule (50,000 Units total) by mouth 2 (two) times a week.   ibuprofen 200 MG tablet Commonly known as:   ADVIL,MOTRIN Take 200 mg by mouth every 6 (six) hours as needed.   metaxalone 800 MG tablet Commonly known as:  SKELAXIN Take 800 mg by mouth 2 (two) times daily as needed. For migraines   PRESCRIPTION MEDICATION Apply 1 application topically 2 (two) times daily as needed (Cortisone). Eczema cream   SUMAtriptan 100 MG tablet Commonly known as:  IMITREX Take 100 mg by mouth every 2 (two) hours as needed. Take 1 tablet at onset of migraine  may repeat in 2 hours  Limit use to 2 days a week   Toremifene Citrate 60 MG tablet Commonly known as:  FARESTON Take 1 tablet (60 mg total) by mouth daily. 90 day supply       Allergies:  Allergies  Allergen Reactions  . Amoxicillin Itching  . Cephalexin Itching  . Cephalexin Itching  . Penicillins Hives    Past Medical History, Surgical history, Social history, and Family History were reviewed and updated.  Review of Systems: All other 10 point review of systems is negative.   Physical Exam:  weight is 147 lb (66.7 kg). Her oral temperature is 98.9 F (37.2 C). Her blood pressure is 102/66 and her pulse is 85. Her respiration is 16 and oxygen saturation is 99%.   Wt Readings from Last 3 Encounters:  06/13/17 147 lb (66.7  kg)  12/04/16 140 lb (63.5 kg)  05/30/16 131 lb 1.9 oz (59.5 kg)    Ocular: Sclerae unicteric, pupils equal, round and reactive to light Ear-nose-throat: Oropharynx clear, dentition fair Lymphatic: No cervical, supraclavicular adenopathy Lungs no rales or rhonchi, good excursion bilaterally Heart regular rate and rhythm, no murmur appreciated Abd soft, nontender, positive bowel sounds, no liver or spleen tip palpated on exam, no fluid wave  MSK no focal spinal tenderness, no joint edema Neuro: non-focal, well-oriented, appropriate affect Breasts: left breast lumpectomy scar at the 1-2 o'clock position intact. No changed to the right breast. No mass, lesion or rash present.   Lab Results  Component Value Date    WBC 5.5 06/06/2017   HGB 13.7 06/06/2017   HCT 40.3 06/06/2017   MCV 81 06/06/2017   PLT 230 06/06/2017   Lab Results  Component Value Date   FERRITIN 12 06/06/2017   IRON 72 06/06/2017   TIBC 466 (H) 06/06/2017   UIBC 394 (H) 06/06/2017   IRONPCTSAT 15 (L) 06/06/2017   Lab Results  Component Value Date   RETICCTPCT 1.0 05/12/2015   RBC 4.96 06/06/2017   RETICCTABS 48.9 05/12/2015   No results found for: KPAFRELGTCHN, LAMBDASER, KAPLAMBRATIO No results found for: IGGSERUM, IGA, IGMSERUM No results found for: Odetta Pink, SPEI   Chemistry      Component Value Date/Time   NA 138 06/06/2017 0821   NA 140 12/04/2016 0747   K 4.1 06/06/2017 0821   K 4.2 12/04/2016 0747   CL 107 06/06/2017 0821   CO2 26 06/06/2017 0821   CO2 25 12/04/2016 0747   BUN 13 06/06/2017 0821   BUN 13.2 12/04/2016 0747   CREATININE 0.7 06/06/2017 0821   CREATININE 0.7 12/04/2016 0747      Component Value Date/Time   CALCIUM 9.0 06/06/2017 0821   CALCIUM 9.3 12/04/2016 0747   ALKPHOS 62 06/06/2017 0821   ALKPHOS 64 12/04/2016 0747   AST 24 06/06/2017 0821   AST 17 12/04/2016 0747   ALT 25 06/06/2017 0821   ALT 10 12/04/2016 0747   BILITOT 1.00 06/06/2017 0821   BILITOT 0.73 12/04/2016 0747      Impression and Plan: Pamela Ray is a very pleasant 51 yo caucasian female with 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 She was treated with lumpectomy and completed radiation. She is currently on Fareston and doing well.  She is symptomatic with low iron and low vitamin D.  We will give her IV iron today and also have her restart her Vitamin D supplement twice a week.  We will plan to see her back again in 6 weeks for repeat lab work and follow-up.  She will contact our office with any questions or concerns. We can certainly see her sooner if need be.   Eliezer Bottom, NP 10/26/20181:02 PM

## 2017-06-13 NOTE — Patient Instructions (Signed)

## 2017-06-19 ENCOUNTER — Ambulatory Visit (INDEPENDENT_AMBULATORY_CARE_PROVIDER_SITE_OTHER): Payer: 59 | Admitting: Psychology

## 2017-06-19 DIAGNOSIS — F411 Generalized anxiety disorder: Secondary | ICD-10-CM | POA: Diagnosis not present

## 2017-06-19 DIAGNOSIS — F332 Major depressive disorder, recurrent severe without psychotic features: Secondary | ICD-10-CM

## 2017-06-25 ENCOUNTER — Ambulatory Visit (HOSPITAL_BASED_OUTPATIENT_CLINIC_OR_DEPARTMENT_OTHER): Payer: 59

## 2017-06-26 ENCOUNTER — Ambulatory Visit: Payer: Self-pay | Admitting: Psychology

## 2017-06-30 ENCOUNTER — Ambulatory Visit (INDEPENDENT_AMBULATORY_CARE_PROVIDER_SITE_OTHER): Payer: 59 | Admitting: Psychology

## 2017-06-30 DIAGNOSIS — F332 Major depressive disorder, recurrent severe without psychotic features: Secondary | ICD-10-CM

## 2017-06-30 DIAGNOSIS — F411 Generalized anxiety disorder: Secondary | ICD-10-CM | POA: Diagnosis not present

## 2017-07-03 ENCOUNTER — Ambulatory Visit: Payer: 59 | Admitting: Psychology

## 2017-07-08 ENCOUNTER — Ambulatory Visit (HOSPITAL_BASED_OUTPATIENT_CLINIC_OR_DEPARTMENT_OTHER)
Admission: RE | Admit: 2017-07-08 | Discharge: 2017-07-08 | Disposition: A | Discharge status: Home / Self Care | Payer: 59 | Source: Ambulatory Visit | Attending: Family | Admitting: Family

## 2017-07-08 DIAGNOSIS — D0512 Intraductal carcinoma in situ of left breast: Secondary | ICD-10-CM | POA: Diagnosis not present

## 2017-07-08 DIAGNOSIS — T386X5A Adverse effect of antigonadotrophins, antiestrogens, antiandrogens, not elsewhere classified, initial encounter: Secondary | ICD-10-CM | POA: Diagnosis not present

## 2017-07-08 DIAGNOSIS — M818 Other osteoporosis without current pathological fracture: Secondary | ICD-10-CM

## 2017-07-08 DIAGNOSIS — D509 Iron deficiency anemia, unspecified: Secondary | ICD-10-CM

## 2017-07-08 DIAGNOSIS — Z8249 Family history of ischemic heart disease and other diseases of the circulatory system: Secondary | ICD-10-CM

## 2017-07-08 DIAGNOSIS — E7801 Familial hypercholesterolemia: Secondary | ICD-10-CM | POA: Diagnosis not present

## 2017-07-08 DIAGNOSIS — R002 Palpitations: Secondary | ICD-10-CM | POA: Diagnosis not present

## 2017-07-08 DIAGNOSIS — F064 Anxiety disorder due to known physiological condition: Secondary | ICD-10-CM

## 2017-07-08 DIAGNOSIS — D508 Other iron deficiency anemias: Secondary | ICD-10-CM

## 2017-07-08 DIAGNOSIS — R739 Hyperglycemia, unspecified: Secondary | ICD-10-CM

## 2017-07-08 DIAGNOSIS — E78019 Familial hypercholesterolemia, unspecified: Secondary | ICD-10-CM

## 2017-07-08 DIAGNOSIS — E559 Vitamin D deficiency, unspecified: Secondary | ICD-10-CM | POA: Diagnosis not present

## 2017-07-08 DIAGNOSIS — M810 Age-related osteoporosis without current pathological fracture: Secondary | ICD-10-CM

## 2017-07-08 NOTE — Progress Notes (Signed)
  Echocardiogram 2D Echocardiogram has been performed.  Joelene Millin 07/08/2017, 2:58 PM

## 2017-07-09 ENCOUNTER — Encounter: Payer: Self-pay | Admitting: *Deleted

## 2017-07-17 ENCOUNTER — Ambulatory Visit: Payer: 59 | Admitting: Psychology

## 2017-07-24 ENCOUNTER — Ambulatory Visit (INDEPENDENT_AMBULATORY_CARE_PROVIDER_SITE_OTHER): Payer: 59 | Admitting: Psychology

## 2017-07-24 DIAGNOSIS — F411 Generalized anxiety disorder: Secondary | ICD-10-CM

## 2017-07-24 DIAGNOSIS — F332 Major depressive disorder, recurrent severe without psychotic features: Secondary | ICD-10-CM

## 2017-07-25 ENCOUNTER — Other Ambulatory Visit (HOSPITAL_BASED_OUTPATIENT_CLINIC_OR_DEPARTMENT_OTHER): Payer: 59

## 2017-07-25 ENCOUNTER — Ambulatory Visit (HOSPITAL_BASED_OUTPATIENT_CLINIC_OR_DEPARTMENT_OTHER): Payer: 59 | Admitting: Family

## 2017-07-25 ENCOUNTER — Encounter: Payer: Self-pay | Admitting: Family

## 2017-07-25 ENCOUNTER — Other Ambulatory Visit: Payer: Self-pay

## 2017-07-25 ENCOUNTER — Other Ambulatory Visit: Payer: Self-pay | Admitting: Family

## 2017-07-25 DIAGNOSIS — F064 Anxiety disorder due to known physiological condition: Secondary | ICD-10-CM

## 2017-07-25 DIAGNOSIS — D509 Iron deficiency anemia, unspecified: Secondary | ICD-10-CM

## 2017-07-25 DIAGNOSIS — R739 Hyperglycemia, unspecified: Secondary | ICD-10-CM

## 2017-07-25 DIAGNOSIS — M818 Other osteoporosis without current pathological fracture: Secondary | ICD-10-CM

## 2017-07-25 DIAGNOSIS — Z8249 Family history of ischemic heart disease and other diseases of the circulatory system: Secondary | ICD-10-CM

## 2017-07-25 DIAGNOSIS — D0512 Intraductal carcinoma in situ of left breast: Secondary | ICD-10-CM

## 2017-07-25 DIAGNOSIS — E559 Vitamin D deficiency, unspecified: Secondary | ICD-10-CM

## 2017-07-25 DIAGNOSIS — R002 Palpitations: Secondary | ICD-10-CM

## 2017-07-25 DIAGNOSIS — D508 Other iron deficiency anemias: Secondary | ICD-10-CM

## 2017-07-25 DIAGNOSIS — E7801 Familial hypercholesterolemia: Secondary | ICD-10-CM

## 2017-07-25 DIAGNOSIS — M810 Age-related osteoporosis without current pathological fracture: Secondary | ICD-10-CM

## 2017-07-25 DIAGNOSIS — T386X5A Adverse effect of antigonadotrophins, antiestrogens, antiandrogens, not elsewhere classified, initial encounter: Secondary | ICD-10-CM

## 2017-07-25 LAB — CBC WITH DIFFERENTIAL (CANCER CENTER ONLY)
BASO#: 0 10*3/uL (ref 0.0–0.2)
BASO%: 0.4 % (ref 0.0–2.0)
EOS%: 3.4 % (ref 0.0–7.0)
Eosinophils Absolute: 0.2 10*3/uL (ref 0.0–0.5)
HCT: 40.6 % (ref 34.8–46.6)
HGB: 14 g/dL (ref 11.6–15.9)
LYMPH#: 1.5 10*3/uL (ref 0.9–3.3)
LYMPH%: 29.5 % (ref 14.0–48.0)
MCH: 28.3 pg (ref 26.0–34.0)
MCHC: 34.5 g/dL (ref 32.0–36.0)
MCV: 82 fL (ref 81–101)
MONO#: 0.5 10*3/uL (ref 0.1–0.9)
MONO%: 10 % (ref 0.0–13.0)
NEUT#: 3 10*3/uL (ref 1.5–6.5)
NEUT%: 56.7 % (ref 39.6–80.0)
PLATELETS: 204 10*3/uL (ref 145–400)
RBC: 4.94 10*6/uL (ref 3.70–5.32)
RDW: 17.2 % — AB (ref 11.1–15.7)
WBC: 5.2 10*3/uL (ref 3.9–10.0)

## 2017-07-25 LAB — CMP (CANCER CENTER ONLY)
ALK PHOS: 65 U/L (ref 26–84)
ALT: 20 U/L (ref 10–47)
AST: 20 U/L (ref 11–38)
Albumin: 3.4 g/dL (ref 3.3–5.5)
BUN: 12 mg/dL (ref 7–22)
CO2: 27 mEq/L (ref 18–33)
Calcium: 9.4 mg/dL (ref 8.0–10.3)
Chloride: 108 mEq/L (ref 98–108)
Creat: 0.7 mg/dl (ref 0.6–1.2)
GLUCOSE: 99 mg/dL (ref 73–118)
POTASSIUM: 3.9 meq/L (ref 3.3–4.7)
Sodium: 141 mEq/L (ref 128–145)
Total Bilirubin: 0.7 mg/dl (ref 0.20–1.60)
Total Protein: 6.5 g/dL (ref 6.4–8.1)

## 2017-07-25 LAB — IRON AND TIBC
%SAT: 27 % (ref 21–57)
Iron: 93 ug/dL (ref 41–142)
TIBC: 349 ug/dL (ref 236–444)
UIBC: 256 ug/dL (ref 120–384)

## 2017-07-25 LAB — FERRITIN: Ferritin: 51 ng/ml (ref 9–269)

## 2017-07-25 MED ORDER — ERGOCALCIFEROL 1.25 MG (50000 UT) PO CAPS: 50000.0000 [IU] | ORAL_CAPSULE | ORAL | 5 refills | Status: DC

## 2017-07-25 MED ORDER — ERGOCALCIFEROL 1.25 MG (50000 UT) PO CAPS: 50000.0000 [IU] | ORAL_CAPSULE | ORAL | 0 refills | Status: DC

## 2017-07-25 NOTE — Progress Notes (Signed)
Hematology and Oncology Follow Up Visit  Pamela Ray [id number] January 19, 1966 51 y.o. 07/25/2017   Principle Diagnosis:  DCIS of the left breast Recurrent iron deficiency anemia  Current Therapy:   IV iron with Feraheme as indicated - last dose October 2018 Fareston 60mg  po q day   Interim History:  Pamela Ray is here today for follow-up. She is feeling fatigued and has had several episodes of SOB and chest heaviness with over exertion.  She received IV iron in October. Iron saturation at that time was 15% with a ferritin of 12.  ECHO 2 weeks ago shoed an EF of 55-65%.  She states there have been no changes with the chest since her visit in October. No lymphadenopathy found on exam.  Her cycle is regular and not heavy. She has had no other bleeding, no bruising or petechiae.  No fever, chills, n/v, cough, rash, dizziness, chest pain, abdominal pain or changes in bowel or bladder habits.  No swelling, tenderness, numbness or tingling in her extremities. No c/o pain.  She has maintained a good appetite and is staying well hydrated. Her weight is stable.  She is walking still for exercise. She states that she is feeling much calmer and life seems to have settled down.   ECOG Performance Status: 1 - Symptomatic but completely ambulatory  Medications:  Allergies as of 07/25/2017      Reactions   Amoxicillin Itching   Cephalexin Itching   Cephalexin Itching   Penicillins Hives      Medication List        Accurate as of 07/25/17 11:40 AM. Always use your most recent med list.          ALPRAZolam 0.25 MG tablet Commonly known as:  XANAX Take 1 tablet (0.25 mg total) by mouth 3 (three) times daily as needed for anxiety.   BOTOX 100 units Solr injection Generic drug:  botulinum toxin Type A Every 3 months   ergocalciferol 50000 units capsule Commonly known as:  VITAMIN D2 Take 1 capsule (50,000 Units total) by mouth 2 (two) times a week.   ibuprofen 200 MG tablet Commonly  known as:  ADVIL,MOTRIN Take 200 mg by mouth every 6 (six) hours as needed.   metaxalone 800 MG tablet Commonly known as:  SKELAXIN Take 800 mg by mouth 2 (two) times daily as needed. For migraines   PRESCRIPTION MEDICATION Apply 1 application topically 2 (two) times daily as needed (Cortisone). Eczema cream   SUMAtriptan 100 MG tablet Commonly known as:  IMITREX Take 100 mg by mouth every 2 (two) hours as needed. Take 1 tablet at onset of migraine  may repeat in 2 hours  Limit use to 2 days a week   Toremifene Citrate 60 MG tablet Commonly known as:  FARESTON Take 1 tablet (60 mg total) by mouth daily. 90 day supply       Allergies:  Allergies  Allergen Reactions  . Amoxicillin Itching  . Cephalexin Itching  . Cephalexin Itching  . Penicillins Hives    Past Medical History, Surgical history, Social history, and Family History were reviewed and updated.  Review of Systems: All other 10 point review of systems is negative.   Physical Exam:  vitals were not taken for this visit.   Wt Readings from Last 3 Encounters:  06/13/17 147 lb (66.7 kg)  12/04/16 140 lb (63.5 kg)  05/30/16 131 lb 1.9 oz (59.5 kg)    Ocular: Sclerae unicteric, pupils equal, round and reactive to  light Ear-nose-throat: Oropharynx clear, dentition fair Lymphatic: No cervical, supraclavicular or axillary adenopathy Lungs no rales or rhonchi, good excursion bilaterally Heart regular rate and rhythm, no murmur appreciated Abd soft, nontender, positive bowel sounds, no liver or spleen tip palpated on exam, no fluid wave  MSK no focal spinal tenderness, no joint edema Neuro: non-focal, well-oriented, appropriate affect Breasts: Deferred today  Lab Results  Component Value Date   WBC 5.5 06/06/2017   HGB 13.7 06/06/2017   HCT 40.3 06/06/2017   MCV 81 06/06/2017   PLT 230 06/06/2017   Lab Results  Component Value Date   FERRITIN 12 06/06/2017   IRON 72 06/06/2017   TIBC 466 (H) 06/06/2017     UIBC 394 (H) 06/06/2017   IRONPCTSAT 15 (L) 06/06/2017   Lab Results  Component Value Date   RETICCTPCT 1.0 05/12/2015   RBC 4.96 06/06/2017   RETICCTABS 48.9 05/12/2015   No results found for: KPAFRELGTCHN, LAMBDASER, KAPLAMBRATIO No results found for: IGGSERUM, IGA, IGMSERUM No results found for: Odetta Pink, SPEI   Chemistry      Component Value Date/Time   NA 138 06/06/2017 0821   NA 140 12/04/2016 0747   K 4.1 06/06/2017 0821   K 4.2 12/04/2016 0747   CL 107 06/06/2017 0821   CO2 26 06/06/2017 0821   CO2 25 12/04/2016 0747   BUN 13 06/06/2017 0821   BUN 13.2 12/04/2016 0747   CREATININE 0.7 06/06/2017 0821   CREATININE 0.7 12/04/2016 0747      Component Value Date/Time   CALCIUM 9.0 06/06/2017 0821   CALCIUM 9.3 12/04/2016 0747   ALKPHOS 62 06/06/2017 0821   ALKPHOS 64 12/04/2016 0747   AST 24 06/06/2017 0821   AST 17 12/04/2016 0747   ALT 25 06/06/2017 0821   ALT 10 12/04/2016 0747   BILITOT 1.00 06/06/2017 0821   BILITOT 0.73 12/04/2016 0747      Impression and Plan: Pamela Ray is a very pleasant 51 yo caucasian female with 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 She had lumpectomy and completed radiation. She is currently on Fareston and continues to do well.  She has had some fatigue as well and SOB with chest heaviness on occasion.  We will see what her iron studies and vitamin D show. We can bring her back next week for infusion if needed.  I have also refilled her Vit D for this month with [facility] and another 11 months with her delivery pharmacy.  We will plan to see her back again in another 2 months for follow-up. 'She promises to contact our office with any questions or concerns. We can certainly see him sooner if need be.   Laverna Peace, NP 12/7/201811:40 AM

## 2017-07-26 LAB — VITAMIN D 25 HYDROXY (VIT D DEFICIENCY, FRACTURES): Vitamin D, 25-Hydroxy: 35.4 ng/mL (ref 30.0–100.0)

## 2017-07-30 ENCOUNTER — Other Ambulatory Visit: Payer: Self-pay | Admitting: *Deleted

## 2017-07-30 DIAGNOSIS — E559 Vitamin D deficiency, unspecified: Secondary | ICD-10-CM

## 2017-07-30 DIAGNOSIS — F064 Anxiety disorder due to known physiological condition: Secondary | ICD-10-CM

## 2017-07-30 DIAGNOSIS — M810 Age-related osteoporosis without current pathological fracture: Secondary | ICD-10-CM

## 2017-07-30 DIAGNOSIS — D508 Other iron deficiency anemias: Secondary | ICD-10-CM

## 2017-07-30 DIAGNOSIS — D509 Iron deficiency anemia, unspecified: Secondary | ICD-10-CM

## 2017-07-30 DIAGNOSIS — D0512 Intraductal carcinoma in situ of left breast: Secondary | ICD-10-CM

## 2017-07-30 DIAGNOSIS — Z8249 Family history of ischemic heart disease and other diseases of the circulatory system: Secondary | ICD-10-CM

## 2017-07-30 DIAGNOSIS — R002 Palpitations: Secondary | ICD-10-CM

## 2017-07-30 DIAGNOSIS — M818 Other osteoporosis without current pathological fracture: Secondary | ICD-10-CM

## 2017-07-30 DIAGNOSIS — E7801 Familial hypercholesterolemia: Secondary | ICD-10-CM

## 2017-07-30 DIAGNOSIS — T386X5A Adverse effect of antigonadotrophins, antiestrogens, antiandrogens, not elsewhere classified, initial encounter: Secondary | ICD-10-CM

## 2017-07-30 DIAGNOSIS — R739 Hyperglycemia, unspecified: Secondary | ICD-10-CM

## 2017-07-30 MED ORDER — ERGOCALCIFEROL 1.25 MG (50000 UT) PO CAPS: 50000.0000 [IU] | ORAL_CAPSULE | ORAL | 5 refills | Status: DC

## 2017-07-31 ENCOUNTER — Ambulatory Visit (INDEPENDENT_AMBULATORY_CARE_PROVIDER_SITE_OTHER): Payer: 59 | Admitting: Psychology

## 2017-07-31 DIAGNOSIS — F411 Generalized anxiety disorder: Secondary | ICD-10-CM | POA: Diagnosis not present

## 2017-07-31 DIAGNOSIS — F332 Major depressive disorder, recurrent severe without psychotic features: Secondary | ICD-10-CM | POA: Diagnosis not present

## 2017-08-07 ENCOUNTER — Ambulatory Visit (INDEPENDENT_AMBULATORY_CARE_PROVIDER_SITE_OTHER): Payer: 59 | Admitting: Psychology

## 2017-08-07 DIAGNOSIS — F332 Major depressive disorder, recurrent severe without psychotic features: Secondary | ICD-10-CM

## 2017-08-07 DIAGNOSIS — F411 Generalized anxiety disorder: Secondary | ICD-10-CM

## 2017-08-14 ENCOUNTER — Ambulatory Visit: Payer: 59 | Admitting: Psychology

## 2017-08-21 ENCOUNTER — Ambulatory Visit (INDEPENDENT_AMBULATORY_CARE_PROVIDER_SITE_OTHER): Payer: 59 | Admitting: Psychology

## 2017-08-21 DIAGNOSIS — F411 Generalized anxiety disorder: Secondary | ICD-10-CM

## 2017-08-21 DIAGNOSIS — F332 Major depressive disorder, recurrent severe without psychotic features: Secondary | ICD-10-CM | POA: Diagnosis not present

## 2017-08-28 ENCOUNTER — Ambulatory Visit (INDEPENDENT_AMBULATORY_CARE_PROVIDER_SITE_OTHER): Payer: 59 | Admitting: Psychology

## 2017-08-28 DIAGNOSIS — F332 Major depressive disorder, recurrent severe without psychotic features: Secondary | ICD-10-CM

## 2017-08-28 DIAGNOSIS — F411 Generalized anxiety disorder: Secondary | ICD-10-CM | POA: Diagnosis not present

## 2017-09-04 ENCOUNTER — Ambulatory Visit (INDEPENDENT_AMBULATORY_CARE_PROVIDER_SITE_OTHER): Payer: 59 | Admitting: Psychology

## 2017-09-04 DIAGNOSIS — F332 Major depressive disorder, recurrent severe without psychotic features: Secondary | ICD-10-CM | POA: Diagnosis not present

## 2017-09-04 DIAGNOSIS — F411 Generalized anxiety disorder: Secondary | ICD-10-CM | POA: Diagnosis not present

## 2017-09-11 ENCOUNTER — Ambulatory Visit (INDEPENDENT_AMBULATORY_CARE_PROVIDER_SITE_OTHER): Payer: 59 | Admitting: Psychology

## 2017-09-11 DIAGNOSIS — F411 Generalized anxiety disorder: Secondary | ICD-10-CM

## 2017-09-11 DIAGNOSIS — F332 Major depressive disorder, recurrent severe without psychotic features: Secondary | ICD-10-CM

## 2017-09-18 ENCOUNTER — Ambulatory Visit: Payer: 59 | Admitting: Psychology

## 2017-09-19 ENCOUNTER — Ambulatory Visit (INDEPENDENT_AMBULATORY_CARE_PROVIDER_SITE_OTHER): Payer: 59 | Admitting: Psychology

## 2017-09-19 DIAGNOSIS — F411 Generalized anxiety disorder: Secondary | ICD-10-CM

## 2017-09-19 DIAGNOSIS — F332 Major depressive disorder, recurrent severe without psychotic features: Secondary | ICD-10-CM | POA: Diagnosis not present

## 2017-09-25 ENCOUNTER — Ambulatory Visit (INDEPENDENT_AMBULATORY_CARE_PROVIDER_SITE_OTHER): Payer: 59 | Admitting: Psychology

## 2017-09-25 DIAGNOSIS — F332 Major depressive disorder, recurrent severe without psychotic features: Secondary | ICD-10-CM | POA: Diagnosis not present

## 2017-09-25 DIAGNOSIS — F411 Generalized anxiety disorder: Secondary | ICD-10-CM

## 2017-09-26 ENCOUNTER — Other Ambulatory Visit: Payer: Self-pay

## 2017-09-26 ENCOUNTER — Encounter: Payer: Self-pay | Admitting: Family

## 2017-09-26 ENCOUNTER — Inpatient Hospital Stay: Payer: 59

## 2017-09-26 ENCOUNTER — Inpatient Hospital Stay: Payer: 59 | Attending: Family | Admitting: Family

## 2017-09-26 VITALS — BP 110/74 | HR 75 | Temp 98.2°F | Resp 16 | Wt 143.0 lb

## 2017-09-26 DIAGNOSIS — D0512 Intraductal carcinoma in situ of left breast: Secondary | ICD-10-CM | POA: Diagnosis present

## 2017-09-26 DIAGNOSIS — R5383 Other fatigue: Secondary | ICD-10-CM

## 2017-09-26 DIAGNOSIS — E559 Vitamin D deficiency, unspecified: Secondary | ICD-10-CM

## 2017-09-26 DIAGNOSIS — D508 Other iron deficiency anemias: Secondary | ICD-10-CM

## 2017-09-26 DIAGNOSIS — D509 Iron deficiency anemia, unspecified: Secondary | ICD-10-CM

## 2017-09-26 LAB — CMP (CANCER CENTER ONLY)
ALBUMIN: 3.6 g/dL (ref 3.5–5.0)
ALT: 13 U/L (ref 0–55)
AST: 22 U/L (ref 5–34)
Alkaline Phosphatase: 71 U/L (ref 26–84)
Anion gap: 5 (ref 5–15)
BUN: 12 mg/dL (ref 7–22)
CHLORIDE: 107 mmol/L (ref 98–108)
CO2: 30 mmol/L (ref 18–33)
Calcium: 9.5 mg/dL (ref 8.0–10.3)
Creatinine: 0.7 mg/dL (ref 0.60–1.10)
GLUCOSE: 98 mg/dL (ref 73–118)
POTASSIUM: 4 mmol/L (ref 3.3–4.7)
SODIUM: 142 mmol/L (ref 128–145)
Total Bilirubin: 0.7 mg/dL (ref 0.2–1.2)
Total Protein: 7.2 g/dL (ref 6.4–8.1)

## 2017-09-26 LAB — CBC WITH DIFFERENTIAL (CANCER CENTER ONLY)
BASOS ABS: 0 10*3/uL (ref 0.0–0.1)
BASOS PCT: 0 %
EOS ABS: 0.2 10*3/uL (ref 0.0–0.5)
EOS PCT: 5 %
HCT: 43.2 % (ref 34.8–46.6)
Hemoglobin: 14.8 g/dL (ref 11.6–15.9)
LYMPHS ABS: 1.6 10*3/uL (ref 0.9–3.3)
Lymphocytes Relative: 31 %
MCH: 29 pg (ref 26.0–34.0)
MCHC: 34.3 g/dL (ref 32.0–36.0)
MCV: 84.5 fL (ref 81.0–101.0)
Monocytes Absolute: 0.7 10*3/uL (ref 0.1–0.9)
Monocytes Relative: 14 %
Neutro Abs: 2.6 10*3/uL (ref 1.5–6.5)
Neutrophils Relative %: 50 %
PLATELETS: 217 10*3/uL (ref 145–400)
RBC: 5.11 MIL/uL (ref 3.70–5.32)
RDW: 14.7 % (ref 11.1–15.7)
WBC: 5.1 10*3/uL (ref 3.9–10.0)

## 2017-09-26 NOTE — Progress Notes (Signed)
Hematology and Oncology Follow Up Visit  Toshiye Kever [id number] 01-Jun-1966 52 y.o. 09/26/2017   Principle Diagnosis:  DCIS of the left breast Recurrent iron deficiency anemia  Current Therapy:   IV iron with Feraheme as indicated - last dose October 2018 Fareston 60mg  po q day   Interim History:  Ms. Pankowski is here today for follow-up. She is doing well but did have a little bout with with a productive cough and sharp pleural pain on inhalation. She states that this has resolved and she has had no other symptoms. She has had some mild fatigue.  The frequent change in temperature lately has exacerbated her migraines. She receives Botox injections periodically and takes Imitrex as needed.  No fever, chills, n/v, rash, dizziness, chest (cardiac) pain, abdominal pain or changes in bowel or bladder habits.  She has occasional palpitations. This is unchanged.  No episodes of bleeding, no bruising or petechiae. No lymphadenopathy found on exam.  No swelling, tenderness, numbness or tingling in her extremities. No c.o pain.  She has maintained a good appetite and is staying well hydrated. Her weight is stable.   ECOG Performance Status: 1 - Symptomatic but completely ambulatory  Medications:  Allergies as of 09/26/2017      Reactions   Amoxicillin Itching   Cephalexin Itching   Cephalexin Itching   Penicillins Hives      Medication List        Accurate as of 09/26/17  4:02 PM. Always use your most recent med list.          ALPRAZolam 0.25 MG tablet Commonly known as:  XANAX Take 1 tablet (0.25 mg total) by mouth 3 (three) times daily as needed for anxiety.   BOTOX 100 units Solr injection Generic drug:  botulinum toxin Type A Every 3 months   ergocalciferol 50000 units capsule Commonly known as:  VITAMIN D2 Take 1 capsule (50,000 Units total) by mouth 2 (two) times a week.   ibuprofen 200 MG tablet Commonly known as:  ADVIL,MOTRIN Take 200 mg by mouth every 6 (six) hours as  needed.   metaxalone 800 MG tablet Commonly known as:  SKELAXIN Take 800 mg by mouth 2 (two) times daily as needed. For migraines   SUMAtriptan 100 MG tablet Commonly known as:  IMITREX Take 100 mg by mouth every 2 (two) hours as needed. Take 1 tablet at onset of migraine  may repeat in 2 hours  Limit use to 2 days a week   Toremifene Citrate 60 MG tablet Commonly known as:  FARESTON Take 1 tablet (60 mg total) by mouth daily. 90 day supply       Allergies:  Allergies  Allergen Reactions  . Amoxicillin Itching  . Cephalexin Itching  . Cephalexin Itching  . Penicillins Hives    Past Medical History, Surgical history, Social history, and Family History were reviewed and updated.  Review of Systems: All other 10 point review of systems is negative.   Physical Exam:  weight is 143 lb (64.9 kg). Her oral temperature is 98.2 F (36.8 C). Her blood pressure is 110/74 and her pulse is 75. Her respiration is 16 and oxygen saturation is 100%.   Wt Readings from Last 3 Encounters:  09/26/17 143 lb (64.9 kg)  07/25/17 147 lb (66.7 kg)  06/13/17 147 lb (66.7 kg)    Ocular: Sclerae unicteric, pupils equal, round and reactive to light Ear-nose-throat: Oropharynx clear, dentition fair Lymphatic: No cervical, supraclavicular or axillary adenopathy Lungs no rales  or rhonchi, good excursion bilaterally Heart regular rate and rhythm, no murmur appreciated Abd soft, nontender, positive bowel sounds, no liver or spleen tip palpated on exam, no fluid wave  MSK no focal spinal tenderness, no joint edema Neuro: non-focal, well-oriented, appropriate affect Breasts: Deferred this visit. No changes per patient.   Lab Results  Component Value Date   WBC 5.1 09/26/2017   HGB 14.0 07/25/2017   HCT 43.2 09/26/2017   MCV 84.5 09/26/2017   PLT 217 09/26/2017   Lab Results  Component Value Date   FERRITIN 51 07/25/2017   IRON 93 07/25/2017   TIBC 349 07/25/2017   UIBC 256 07/25/2017    IRONPCTSAT 27 07/25/2017   Lab Results  Component Value Date   RETICCTPCT 1.0 05/12/2015   RBC 5.11 09/26/2017   RETICCTABS 48.9 05/12/2015   No results found for: KPAFRELGTCHN, LAMBDASER, KAPLAMBRATIO No results found for: IGGSERUM, IGA, IGMSERUM No results found for: Odetta Pink, SPEI   Chemistry      Component Value Date/Time   NA 142 09/26/2017 1521   NA 141 07/25/2017 1131   NA 140 12/04/2016 0747   K 4.0 09/26/2017 1521   K 3.9 07/25/2017 1131   K 4.2 12/04/2016 0747   CL 107 09/26/2017 1521   CL 108 07/25/2017 1131   CO2 30 09/26/2017 1521   CO2 27 07/25/2017 1131   CO2 25 12/04/2016 0747   BUN 12 09/26/2017 1521   BUN 12 07/25/2017 1131   BUN 13.2 12/04/2016 0747   CREATININE 0.70 09/26/2017 1521   CREATININE 0.7 07/25/2017 1131   CREATININE 0.7 12/04/2016 0747      Component Value Date/Time   CALCIUM 9.5 09/26/2017 1521   CALCIUM 9.4 07/25/2017 1131   CALCIUM 9.3 12/04/2016 0747   ALKPHOS 71 09/26/2017 1521   ALKPHOS 65 07/25/2017 1131   ALKPHOS 64 12/04/2016 0747   AST 22 09/26/2017 1521   AST 17 12/04/2016 0747   ALT 13 09/26/2017 1521   ALT 20 07/25/2017 1131   ALT 10 12/04/2016 0747   BILITOT 0.7 09/26/2017 1521   BILITOT 0.73 12/04/2016 0747      Impression and Plan: Ms. Hamil is a very pleasant 52 yo caucasian female with 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 She had lumpectomy and completed radiation. She is doing well on Fareston.  She has occasional fatigue. We will see what her iron studies show and bring her back in for infusion if needed.  We will plan ot see her back in another 8 weeks for follow-up.  She will contact our office with any questions or concerns. We can certainly see her sooner if need be.   Laverna Peace, NP 2/8/20194:02 PM

## 2017-09-27 LAB — VITAMIN D 25 HYDROXY (VIT D DEFICIENCY, FRACTURES): Vit D, 25-Hydroxy: 39.9 ng/mL (ref 30.0–100.0)

## 2017-09-29 LAB — IRON AND TIBC
Iron: 89 ug/dL (ref 41–142)
SATURATION RATIOS: 22 % (ref 21–57)
TIBC: 404 ug/dL (ref 236–444)
UIBC: 315 ug/dL

## 2017-09-29 LAB — FERRITIN: Ferritin: 23 ng/mL (ref 9–269)

## 2017-10-02 ENCOUNTER — Ambulatory Visit: Payer: 59 | Admitting: Psychology

## 2017-10-09 ENCOUNTER — Ambulatory Visit (INDEPENDENT_AMBULATORY_CARE_PROVIDER_SITE_OTHER): Payer: 59 | Admitting: Psychology

## 2017-10-09 DIAGNOSIS — F332 Major depressive disorder, recurrent severe without psychotic features: Secondary | ICD-10-CM

## 2017-10-09 DIAGNOSIS — F411 Generalized anxiety disorder: Secondary | ICD-10-CM | POA: Diagnosis not present

## 2017-10-16 ENCOUNTER — Ambulatory Visit (INDEPENDENT_AMBULATORY_CARE_PROVIDER_SITE_OTHER): Payer: 59 | Admitting: Psychology

## 2017-10-16 DIAGNOSIS — F411 Generalized anxiety disorder: Secondary | ICD-10-CM | POA: Diagnosis not present

## 2017-10-16 DIAGNOSIS — F332 Major depressive disorder, recurrent severe without psychotic features: Secondary | ICD-10-CM

## 2017-10-23 ENCOUNTER — Ambulatory Visit (INDEPENDENT_AMBULATORY_CARE_PROVIDER_SITE_OTHER): Payer: 59 | Admitting: Psychology

## 2017-10-23 DIAGNOSIS — F332 Major depressive disorder, recurrent severe without psychotic features: Secondary | ICD-10-CM | POA: Diagnosis not present

## 2017-10-23 DIAGNOSIS — F411 Generalized anxiety disorder: Secondary | ICD-10-CM | POA: Diagnosis not present

## 2017-10-30 ENCOUNTER — Ambulatory Visit (INDEPENDENT_AMBULATORY_CARE_PROVIDER_SITE_OTHER): Payer: 59 | Admitting: Psychology

## 2017-10-30 DIAGNOSIS — F411 Generalized anxiety disorder: Secondary | ICD-10-CM

## 2017-10-30 DIAGNOSIS — F332 Major depressive disorder, recurrent severe without psychotic features: Secondary | ICD-10-CM | POA: Diagnosis not present

## 2017-11-06 ENCOUNTER — Ambulatory Visit (INDEPENDENT_AMBULATORY_CARE_PROVIDER_SITE_OTHER): Payer: 59 | Admitting: Psychology

## 2017-11-06 DIAGNOSIS — F332 Major depressive disorder, recurrent severe without psychotic features: Secondary | ICD-10-CM | POA: Diagnosis not present

## 2017-11-06 DIAGNOSIS — F411 Generalized anxiety disorder: Secondary | ICD-10-CM | POA: Diagnosis not present

## 2017-11-13 ENCOUNTER — Ambulatory Visit (INDEPENDENT_AMBULATORY_CARE_PROVIDER_SITE_OTHER): Payer: 59 | Admitting: Psychology

## 2017-11-13 DIAGNOSIS — F332 Major depressive disorder, recurrent severe without psychotic features: Secondary | ICD-10-CM | POA: Diagnosis not present

## 2017-11-13 DIAGNOSIS — F411 Generalized anxiety disorder: Secondary | ICD-10-CM | POA: Diagnosis not present

## 2017-11-20 ENCOUNTER — Ambulatory Visit (INDEPENDENT_AMBULATORY_CARE_PROVIDER_SITE_OTHER): Payer: 59 | Admitting: Psychology

## 2017-11-20 DIAGNOSIS — F332 Major depressive disorder, recurrent severe without psychotic features: Secondary | ICD-10-CM

## 2017-11-20 DIAGNOSIS — F411 Generalized anxiety disorder: Secondary | ICD-10-CM

## 2017-11-27 ENCOUNTER — Ambulatory Visit: Payer: 59 | Admitting: Psychology

## 2017-12-04 ENCOUNTER — Ambulatory Visit (INDEPENDENT_AMBULATORY_CARE_PROVIDER_SITE_OTHER): Payer: 59 | Admitting: Psychology

## 2017-12-04 DIAGNOSIS — F411 Generalized anxiety disorder: Secondary | ICD-10-CM | POA: Diagnosis not present

## 2017-12-04 DIAGNOSIS — F332 Major depressive disorder, recurrent severe without psychotic features: Secondary | ICD-10-CM | POA: Diagnosis not present

## 2017-12-11 ENCOUNTER — Ambulatory Visit: Payer: 59 | Admitting: Psychology

## 2017-12-18 ENCOUNTER — Ambulatory Visit (INDEPENDENT_AMBULATORY_CARE_PROVIDER_SITE_OTHER): Payer: 59 | Admitting: Psychology

## 2017-12-18 DIAGNOSIS — F332 Major depressive disorder, recurrent severe without psychotic features: Secondary | ICD-10-CM

## 2017-12-18 DIAGNOSIS — F411 Generalized anxiety disorder: Secondary | ICD-10-CM

## 2017-12-19 ENCOUNTER — Other Ambulatory Visit: Payer: Self-pay

## 2017-12-19 ENCOUNTER — Inpatient Hospital Stay: Payer: 59 | Attending: Family | Admitting: Family

## 2017-12-19 ENCOUNTER — Encounter: Payer: Self-pay | Admitting: Family

## 2017-12-19 ENCOUNTER — Inpatient Hospital Stay: Payer: 59

## 2017-12-19 VITALS — BP 102/68 | HR 71 | Temp 98.1°F | Resp 16 | Wt 141.0 lb

## 2017-12-19 DIAGNOSIS — D508 Other iron deficiency anemias: Secondary | ICD-10-CM

## 2017-12-19 DIAGNOSIS — D0512 Intraductal carcinoma in situ of left breast: Secondary | ICD-10-CM

## 2017-12-19 DIAGNOSIS — D509 Iron deficiency anemia, unspecified: Secondary | ICD-10-CM | POA: Diagnosis not present

## 2017-12-19 DIAGNOSIS — E559 Vitamin D deficiency, unspecified: Secondary | ICD-10-CM

## 2017-12-19 LAB — CMP (CANCER CENTER ONLY)
ALK PHOS: 73 U/L (ref 40–150)
ALT: 11 U/L (ref 0–55)
AST: 21 U/L (ref 5–34)
Albumin: 4.1 g/dL (ref 3.5–5.0)
Anion gap: 6 (ref 3–11)
BILIRUBIN TOTAL: 0.5 mg/dL (ref 0.2–1.2)
BUN: 11 mg/dL (ref 7–26)
CALCIUM: 9.7 mg/dL (ref 8.4–10.4)
CO2: 28 mmol/L (ref 22–29)
Chloride: 104 mmol/L (ref 98–109)
Creatinine: 0.74 mg/dL (ref 0.60–1.10)
Glucose, Bld: 101 mg/dL (ref 70–140)
Potassium: 4.2 mmol/L (ref 3.5–5.1)
SODIUM: 138 mmol/L (ref 136–145)
TOTAL PROTEIN: 7.2 g/dL (ref 6.4–8.3)

## 2017-12-19 LAB — CBC WITH DIFFERENTIAL (CANCER CENTER ONLY)
Basophils Absolute: 0 10*3/uL (ref 0.0–0.1)
Basophils Relative: 0 %
EOS ABS: 0.1 10*3/uL (ref 0.0–0.5)
Eosinophils Relative: 2 %
HEMATOCRIT: 39.7 % (ref 34.8–46.6)
HEMOGLOBIN: 13.2 g/dL (ref 11.6–15.9)
LYMPHS ABS: 1.5 10*3/uL (ref 0.9–3.3)
Lymphocytes Relative: 32 %
MCH: 27.7 pg (ref 26.0–34.0)
MCHC: 33.2 g/dL (ref 32.0–36.0)
MCV: 83.2 fL (ref 81.0–101.0)
MONOS PCT: 11 %
Monocytes Absolute: 0.5 10*3/uL (ref 0.1–0.9)
NEUTROS PCT: 55 %
Neutro Abs: 2.5 10*3/uL (ref 1.5–6.5)
Platelet Count: 259 10*3/uL (ref 145–400)
RBC: 4.77 MIL/uL (ref 3.70–5.32)
RDW: 14 % (ref 11.1–15.7)
WBC Count: 4.7 10*3/uL (ref 3.9–10.0)

## 2017-12-19 NOTE — Progress Notes (Signed)
Hematology and Oncology Follow Up Visit  Pamela Ray [id number] 1965-10-21 52 y.o. 12/19/2017   Principle Diagnosis:  DCIS of the left breast Recurrent iron deficiency anemia  Current Therapy:   IV iron with Feraheme as indicated - last doseOctober 2018 Fareston 60mg  po q day   Interim History:  Pamela Ray is here today for follow-up. She is doing well but has noticed some fatigue She had her mammogram in March and it was negative. Breast exam today showed no changes.   No lymphadenopathy noted on exam.  She states that she is taking her Fareston when she remembers. I encouraged her to take every day as prescribed.  She still has her normal cycle. No other bleeding, no bruising or petechiae.  She has had no issue with infections. No fever, chills, n/v, cough, rash, dizziness, SOB, chest pain, palpitations, abdominal pain or changes in bowel or bladder habits.  No swelling, tenderness, numbness or tingling in her extremities. No c/o pain.  She has a good appetite and is staying well hydrated. Her weight is stable.   ECOG Performance Status: 0 - Asymptomatic  Medications:  Allergies as of 12/19/2017      Reactions   Amoxicillin Itching   Cephalexin Itching   Cephalexin Itching   Penicillins Hives      Medication List        Accurate as of 12/19/17  2:44 PM. Always use your most recent med list.          ALPRAZolam 0.25 MG tablet Commonly known as:  XANAX Take 1 tablet (0.25 mg total) by mouth 3 (three) times daily as needed for anxiety.   BOTOX 100 units Solr injection Generic drug:  botulinum toxin Type A Every 3 months   ergocalciferol 50000 units capsule Commonly known as:  VITAMIN D2 Take 1 capsule (50,000 Units total) by mouth 2 (two) times a week.   ibuprofen 200 MG tablet Commonly known as:  ADVIL,MOTRIN Take 200 mg by mouth every 6 (six) hours as needed.   metaxalone 800 MG tablet Commonly known as:  SKELAXIN Take 800 mg by mouth 2 (two) times daily as  needed. For migraines   SUMAtriptan 100 MG tablet Commonly known as:  IMITREX Take 100 mg by mouth every 2 (two) hours as needed. Take 1 tablet at onset of migraine  may repeat in 2 hours  Limit use to 2 days a week   Toremifene Citrate 60 MG tablet Commonly known as:  FARESTON Take 1 tablet (60 mg total) by mouth daily. 90 day supply       Allergies:  Allergies  Allergen Reactions  . Amoxicillin Itching  . Cephalexin Itching  . Cephalexin Itching  . Penicillins Hives    Past Medical History, Surgical history, Social history, and Family History were reviewed and updated.  Review of Systems: All other 10 point review of systems is negative.   Physical Exam:  weight is 141 lb (64 kg). Her oral temperature is 98.1 F (36.7 C). Her blood pressure is 102/68 and her pulse is 71. Her respiration is 16 and oxygen saturation is 100%.   Wt Readings from Last 3 Encounters:  12/19/17 141 lb (64 kg)  09/26/17 143 lb (64.9 kg)  07/25/17 147 lb (66.7 kg)    Ocular: Sclerae unicteric, pupils equal, round and reactive to light Ear-nose-throat: Oropharynx clear, dentition fair Lymphatic: No cervical, supraclavicular or axillary adenopathy Lungs no rales or rhonchi, good excursion bilaterally Heart regular rate and rhythm, no murmur  appreciated Abd soft, nontender, positive bowel sounds, no liver or spleen tip palpated on exam, no fluid wave MSK no focal spinal tenderness, no joint edema Neuro: non-focal, well-oriented, appropriate affect Breasts: No changes. Left breast lumpectomy scar intact, no changes bilaterally. No mass, lesion or rash.   Lab Results  Component Value Date   WBC 4.7 12/19/2017   HGB 13.2 12/19/2017   HCT 39.7 12/19/2017   MCV 83.2 12/19/2017   PLT 259 12/19/2017   Lab Results  Component Value Date   FERRITIN 23 09/26/2017   IRON 89 09/26/2017   TIBC 404 09/26/2017   UIBC 315 09/26/2017   IRONPCTSAT 22 09/26/2017   Lab Results  Component Value Date     RETICCTPCT 1.0 05/12/2015   RBC 4.77 12/19/2017   RETICCTABS 48.9 05/12/2015   No results found for: KPAFRELGTCHN, LAMBDASER, KAPLAMBRATIO No results found for: IGGSERUM, IGA, IGMSERUM No results found for: Odetta Pink, SPEI   Chemistry      Component Value Date/Time   NA 142 09/26/2017 1521   NA 141 07/25/2017 1131   NA 140 12/04/2016 0747   K 4.0 09/26/2017 1521   K 3.9 07/25/2017 1131   K 4.2 12/04/2016 0747   CL 107 09/26/2017 1521   CL 108 07/25/2017 1131   CO2 30 09/26/2017 1521   CO2 27 07/25/2017 1131   CO2 25 12/04/2016 0747   BUN 12 09/26/2017 1521   BUN 12 07/25/2017 1131   BUN 13.2 12/04/2016 0747   CREATININE 0.70 09/26/2017 1521   CREATININE 0.7 07/25/2017 1131   CREATININE 0.7 12/04/2016 0747      Component Value Date/Time   CALCIUM 9.5 09/26/2017 1521   CALCIUM 9.4 07/25/2017 1131   CALCIUM 9.3 12/04/2016 0747   ALKPHOS 71 09/26/2017 1521   ALKPHOS 65 07/25/2017 1131   ALKPHOS 64 12/04/2016 0747   AST 22 09/26/2017 1521   AST 17 12/04/2016 0747   ALT 13 09/26/2017 1521   ALT 20 07/25/2017 1131   ALT 10 12/04/2016 0747   BILITOT 0.7 09/26/2017 1521   BILITOT 0.73 12/04/2016 0747       Impression and Plan: Pamela Ray is a very pleasant 52 yo caucasian female with 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 She had lumpectomy and completed radiation.  She is taking her Fareston when she remembers and verbalized that she will do her best to take daily.  She has had some fatigue so we will see what her iron studies show and bring her back in for infusion if needed.  We will go ahead and plan to see her back in 3 months for follow-up.  She will contact our office with any questions or concerns. We can certainly see her sooner if need be.   Laverna Peace, NP 5/3/20192:44 PM

## 2017-12-20 LAB — VITAMIN D 25 HYDROXY (VIT D DEFICIENCY, FRACTURES): VIT D 25 HYDROXY: 33 ng/mL (ref 30.0–100.0)

## 2017-12-22 ENCOUNTER — Other Ambulatory Visit: Payer: Self-pay | Admitting: Family

## 2017-12-22 LAB — IRON AND TIBC
Iron: 30 ug/dL — ABNORMAL LOW (ref 41–142)
Saturation Ratios: 7 % — ABNORMAL LOW (ref 21–57)
TIBC: 448 ug/dL — AB (ref 236–444)
UIBC: 417 ug/dL

## 2017-12-22 LAB — FERRITIN: FERRITIN: 11 ng/mL (ref 9–269)

## 2017-12-25 ENCOUNTER — Ambulatory Visit: Payer: 59 | Admitting: Psychology

## 2017-12-30 ENCOUNTER — Other Ambulatory Visit: Payer: Self-pay

## 2017-12-30 ENCOUNTER — Other Ambulatory Visit: Payer: Self-pay | Admitting: Family

## 2017-12-30 ENCOUNTER — Inpatient Hospital Stay: Payer: 59

## 2017-12-30 VITALS — BP 120/81 | HR 70 | Temp 97.5°F | Resp 16

## 2017-12-30 DIAGNOSIS — D0512 Intraductal carcinoma in situ of left breast: Secondary | ICD-10-CM | POA: Diagnosis not present

## 2017-12-30 DIAGNOSIS — D508 Other iron deficiency anemias: Secondary | ICD-10-CM

## 2017-12-30 MED ORDER — FAMOTIDINE IN NACL 20-0.9 MG/50ML-% IV SOLN
INTRAVENOUS | Status: AC
  Filled 2017-12-30: qty 50

## 2017-12-30 MED ORDER — SODIUM CHLORIDE 0.9 % IV SOLN
Freq: Once | INTRAVENOUS | Status: AC
Start: 2017-12-30 — End: 2017-12-30
  Administered 2017-12-30: 15:00:00 via INTRAVENOUS

## 2017-12-30 MED ORDER — METHYLPREDNISOLONE SODIUM SUCC 125 MG IJ SOLR
125.0000 mg | Freq: Once | INTRAMUSCULAR | Status: AC
  Administered 2017-12-30: 125 mg via INTRAVENOUS

## 2017-12-30 MED ORDER — FAMOTIDINE IN NACL 20-0.9 MG/50ML-% IV SOLN
20.0000 mg | Freq: Once | INTRAVENOUS | Status: AC
  Administered 2017-12-30: 20 mg via INTRAVENOUS

## 2017-12-30 MED ORDER — METHYLPREDNISOLONE SODIUM SUCC 125 MG IJ SOLR
INTRAMUSCULAR | Status: AC
  Filled 2017-12-30: qty 2

## 2017-12-30 MED ORDER — SODIUM CHLORIDE 0.9 % IV SOLN
510.0000 mg | Freq: Once | INTRAVENOUS | Status: AC
  Administered 2017-12-30: 510 mg via INTRAVENOUS
  Filled 2017-12-30: qty 17

## 2017-12-30 NOTE — Progress Notes (Signed)
Pt states that she is concerned about getting Feraheme next week d/t when she gets home after [facility], she feels light-headed and like she is "walking on a ship."  [place] NP notified and spoke with pt.  Pt is agreeable to receiving Injectafer at next weeks visit.

## 2017-12-30 NOTE — Progress Notes (Signed)
Feraheme discontinued and Injectafer ordered due to patients c/o feeling "swimmy headed" after each infusion.

## 2017-12-30 NOTE — Patient Instructions (Signed)

## 2018-01-07 ENCOUNTER — Inpatient Hospital Stay: Payer: 59 | Attending: Hematology & Oncology

## 2018-01-07 VITALS — BP 120/83 | HR 76 | Temp 98.4°F | Resp 14

## 2018-01-07 DIAGNOSIS — D509 Iron deficiency anemia, unspecified: Secondary | ICD-10-CM | POA: Insufficient documentation

## 2018-01-07 DIAGNOSIS — D508 Other iron deficiency anemias: Secondary | ICD-10-CM

## 2018-01-07 MED ORDER — SODIUM CHLORIDE 0.9 % IV SOLN
750.0000 mg | Freq: Once | INTRAVENOUS | Status: AC
  Administered 2018-01-07: 750 mg via INTRAVENOUS
  Filled 2018-01-07: qty 15

## 2018-01-07 NOTE — Patient Instructions (Signed)
Ferric carboxymaltose injection What is this medicine? FERRIC CARBOXYMALTOSE (ferr-ik car-box-ee-mol-toes) is an iron complex. Iron is used to make healthy red blood cells, which carry oxygen and nutrients throughout the body. This medicine is used to treat anemia in people with chronic kidney disease or people who cannot take iron by mouth. This medicine may be used for other purposes; ask your health care provider or pharmacist if you have questions. COMMON BRAND NAME(S): Injectafer What should I tell my health care provider before I take this medicine? They need to know if you have any of these conditions: -anemia not caused by low iron levels -high levels of iron in the blood -liver disease -an unusual or allergic reaction to iron, other medicines, foods, dyes, or preservatives -pregnant or trying to get pregnant -breast-feeding How should I use this medicine? This medicine is for infusion into a vein. It is given by a health care professional in a hospital or clinic setting. Talk to your pediatrician regarding the use of this medicine in children. Special care may be needed. Overdosage: If you think you have taken too much of this medicine contact a poison control center or emergency room at once. NOTE: This medicine is only for you. Do not share this medicine with others. What if I miss a dose? It is important not to miss your dose. Call your doctor or health care professional if you are unable to keep an appointment. What may interact with this medicine? Do not take this medicine with any of the following medications: -deferoxamine -dimercaprol -other iron products This medicine may also interact with the following medications: -chloramphenicol -deferasirox This list may not describe all possible interactions. Give your health care provider a list of all the medicines, herbs, non-prescription drugs, or dietary supplements you use. Also tell them if you smoke, drink alcohol, or use  illegal drugs. Some items may interact with your medicine. What should I watch for while using this medicine? Visit your doctor or health care professional regularly. Tell your doctor if your symptoms do not start to get better or if they get worse. You may need blood work done while you are taking this medicine. You may need to follow a special diet. Talk to your doctor. Foods that contain iron include: whole grains/cereals, dried fruits, beans, or peas, leafy green vegetables, and organ meats (liver, kidney). What side effects may I notice from receiving this medicine? Side effects that you should report to your doctor or health care professional as soon as possible: -allergic reactions like skin rash, itching or hives, swelling of the face, lips, or tongue -breathing problems -changes in blood pressure -feeling faint or lightheaded, falls -flushing, sweating, or hot feelings Side effects that usually do not require medical attention (report to your doctor or health care professional if they continue or are bothersome): -changes in taste -constipation -dizziness -headache -nausea -pain, redness, or irritation at site where injected -vomiting This list may not describe all possible side effects. Call your doctor for medical advice about side effects. You may report side effects to FDA at 1-800-FDA-1088. Where should I keep my medicine? This drug is given in a hospital or clinic and will not be stored at home. NOTE: This sheet is a summary. It may not cover all possible information. If you have questions about this medicine, talk to your doctor, pharmacist, or health care provider.  2018 Elsevier/Gold Standard (2015-09-07 11:20:47)  

## 2018-01-08 ENCOUNTER — Ambulatory Visit (INDEPENDENT_AMBULATORY_CARE_PROVIDER_SITE_OTHER): Payer: 59 | Admitting: Psychology

## 2018-01-08 DIAGNOSIS — F411 Generalized anxiety disorder: Secondary | ICD-10-CM

## 2018-01-08 DIAGNOSIS — F332 Major depressive disorder, recurrent severe without psychotic features: Secondary | ICD-10-CM | POA: Diagnosis not present

## 2018-01-14 ENCOUNTER — Other Ambulatory Visit: Payer: Self-pay | Admitting: Family

## 2018-01-15 ENCOUNTER — Ambulatory Visit (INDEPENDENT_AMBULATORY_CARE_PROVIDER_SITE_OTHER): Payer: 59 | Admitting: Psychology

## 2018-01-15 DIAGNOSIS — F411 Generalized anxiety disorder: Secondary | ICD-10-CM

## 2018-01-15 DIAGNOSIS — F332 Major depressive disorder, recurrent severe without psychotic features: Secondary | ICD-10-CM

## 2018-01-22 ENCOUNTER — Ambulatory Visit (INDEPENDENT_AMBULATORY_CARE_PROVIDER_SITE_OTHER): Payer: 59 | Admitting: Psychology

## 2018-01-22 DIAGNOSIS — F332 Major depressive disorder, recurrent severe without psychotic features: Secondary | ICD-10-CM

## 2018-01-22 DIAGNOSIS — F411 Generalized anxiety disorder: Secondary | ICD-10-CM | POA: Diagnosis not present

## 2018-02-05 ENCOUNTER — Ambulatory Visit (INDEPENDENT_AMBULATORY_CARE_PROVIDER_SITE_OTHER): Payer: 59 | Admitting: Psychology

## 2018-02-05 DIAGNOSIS — F411 Generalized anxiety disorder: Secondary | ICD-10-CM

## 2018-02-12 ENCOUNTER — Ambulatory Visit (INDEPENDENT_AMBULATORY_CARE_PROVIDER_SITE_OTHER): Payer: 59 | Admitting: Psychology

## 2018-02-12 DIAGNOSIS — F332 Major depressive disorder, recurrent severe without psychotic features: Secondary | ICD-10-CM

## 2018-02-12 DIAGNOSIS — F411 Generalized anxiety disorder: Secondary | ICD-10-CM

## 2018-02-13 ENCOUNTER — Inpatient Hospital Stay: Payer: 59

## 2018-02-13 ENCOUNTER — Encounter: Payer: Self-pay | Admitting: Family

## 2018-02-13 ENCOUNTER — Inpatient Hospital Stay: Payer: 59 | Attending: Family | Admitting: Family

## 2018-02-13 ENCOUNTER — Other Ambulatory Visit: Payer: Self-pay

## 2018-02-13 VITALS — BP 101/71 | HR 87 | Temp 98.3°F | Resp 16 | Wt 142.0 lb

## 2018-02-13 DIAGNOSIS — D509 Iron deficiency anemia, unspecified: Secondary | ICD-10-CM | POA: Insufficient documentation

## 2018-02-13 DIAGNOSIS — D0512 Intraductal carcinoma in situ of left breast: Secondary | ICD-10-CM | POA: Diagnosis not present

## 2018-02-13 DIAGNOSIS — E559 Vitamin D deficiency, unspecified: Secondary | ICD-10-CM

## 2018-02-13 DIAGNOSIS — D508 Other iron deficiency anemias: Secondary | ICD-10-CM

## 2018-02-13 LAB — CMP (CANCER CENTER ONLY)
ALT: 20 U/L (ref 10–47)
AST: 26 U/L (ref 11–38)
Albumin: 3.4 g/dL — ABNORMAL LOW (ref 3.5–5.0)
Alkaline Phosphatase: 62 U/L (ref 26–84)
Anion gap: 7 (ref 5–15)
BILIRUBIN TOTAL: 0.7 mg/dL (ref 0.2–1.6)
BUN: 13 mg/dL (ref 7–22)
CALCIUM: 9.7 mg/dL (ref 8.0–10.3)
CO2: 28 mmol/L (ref 18–33)
Chloride: 104 mmol/L (ref 98–108)
Creatinine: 0.7 mg/dL (ref 0.60–1.20)
Glucose, Bld: 111 mg/dL (ref 73–118)
Potassium: 4 mmol/L (ref 3.3–4.7)
Sodium: 139 mmol/L (ref 128–145)
TOTAL PROTEIN: 6.7 g/dL (ref 6.4–8.1)

## 2018-02-13 LAB — CBC WITH DIFFERENTIAL (CANCER CENTER ONLY)
BASOS ABS: 0 10*3/uL (ref 0.0–0.1)
Basophils Relative: 0 %
Eosinophils Absolute: 0.3 10*3/uL (ref 0.0–0.5)
Eosinophils Relative: 5 %
HEMATOCRIT: 42.8 % (ref 34.8–46.6)
Hemoglobin: 14.5 g/dL (ref 11.6–15.9)
LYMPHS ABS: 1.6 10*3/uL (ref 0.9–3.3)
LYMPHS PCT: 28 %
MCH: 27.8 pg (ref 26.0–34.0)
MCHC: 33.9 g/dL (ref 32.0–36.0)
MCV: 82.1 fL (ref 81.0–101.0)
MONO ABS: 0.5 10*3/uL (ref 0.1–0.9)
Monocytes Relative: 10 %
NEUTROS ABS: 3.2 10*3/uL (ref 1.5–6.5)
Neutrophils Relative %: 57 %
Platelet Count: 197 10*3/uL (ref 145–400)
RBC: 5.21 MIL/uL (ref 3.70–5.32)
RDW: 18.9 % — AB (ref 11.1–15.7)
WBC Count: 5.6 10*3/uL (ref 3.9–10.0)

## 2018-02-13 NOTE — Progress Notes (Signed)
Hematology and Oncology Follow Up Visit  Pamela Ray [id number] 1966-04-01 52 y.o. 02/13/2018   Principle Diagnosis:  DCIS of the left breast Recurrent iron deficiency anemia  Current Therapy:   IV iron with Feraheme as indicated - last doseOctober 2018 Fareston 60mg  po q day   Interim History: Pamela Ray is here today for follow-up. She is still having some mild fatigue, occasional palpitations and SOB with exertion but this does seem to be a bit better since she received IV iron last month.  She is having pain from hemorrhoids and has seen GI, Dr. Collene Mares. They are scheduling her for endoscopy and colonoscopy.  She recently had her first hot flash and has started taking Amberen. She still has a cycle every 28 days. No other bleeding, no bruising or petechiae.  No swelling, tenderness, numbness or tingling in her extremities.  No lymphadenopathy noted on exam.  She has maintained a good appetite and is staying well hydrated. Her weight is stable.  She is walking for exercise and staying busy with a new project at work.   ECOG Performance Status: 1 - Symptomatic but completely ambulatory  Medications:  Allergies as of 02/13/2018      Reactions   Amoxicillin Itching   Cephalexin Itching   Cephalexin Itching   Penicillins Hives      Medication List        Accurate as of 02/13/18  3:53 PM. Always use your most recent med list.          ALPRAZolam 0.25 MG tablet Commonly known as:  XANAX Take 1 tablet (0.25 mg total) by mouth 3 (three) times daily as needed for anxiety.   BOTOX 100 units Solr injection Generic drug:  botulinum toxin Type A Every 3 months   ergocalciferol 50000 units capsule Commonly known as:  VITAMIN D2 Take 1 capsule (50,000 Units total) by mouth 2 (two) times a week.   ibuprofen 200 MG tablet Commonly known as:  ADVIL,MOTRIN Take 200 mg by mouth every 6 (six) hours as needed.   metaxalone 800 MG tablet Commonly known as:  SKELAXIN Take 800 mg by  mouth 2 (two) times daily as needed. For migraines   SUMAtriptan 100 MG tablet Commonly known as:  IMITREX Take 100 mg by mouth every 2 (two) hours as needed. Take 1 tablet at onset of migraine  may repeat in 2 hours  Limit use to 2 days a week   Toremifene Citrate 60 MG tablet Commonly known as:  FARESTON Take 1 tablet (60 mg total) by mouth daily. 90 day supply       Allergies:  Allergies  Allergen Reactions  . Amoxicillin Itching  . Cephalexin Itching  . Cephalexin Itching  . Penicillins Hives    Past Medical History, Surgical history, Social history, and Family History were reviewed and updated.  Review of Systems: All other 10 point review of systems is negative.   Physical Exam:  vitals were not taken for this visit.   Wt Readings from Last 3 Encounters:  12/19/17 141 lb (64 kg)  09/26/17 143 lb (64.9 kg)  07/25/17 147 lb (66.7 kg)    Ocular: Sclerae unicteric, pupils equal, round and reactive to light Ear-nose-throat: Oropharynx clear, dentition fair Lymphatic: No cervical, supraclavicular or axillary adenopathy Lungs no rales or rhonchi, good excursion bilaterally Heart regular rate and rhythm, no murmur appreciated Abd soft, nontender, positive bowel sounds, no liver or spleen tip palpated on exam, no fluid wave  MSK no focal  spinal tenderness, no joint edema Neuro: non-focal, well-oriented, appropriate affect Breasts: Deferred this visit, no changes per patient   Lab Results  Component Value Date   WBC 5.6 02/13/2018   HGB 14.5 02/13/2018   HCT 42.8 02/13/2018   MCV 82.1 02/13/2018   PLT 197 02/13/2018   Lab Results  Component Value Date   FERRITIN 11 12/19/2017   IRON 30 (L) 12/19/2017   TIBC 448 (H) 12/19/2017   UIBC 417 12/19/2017   IRONPCTSAT 7 (L) 12/19/2017   Lab Results  Component Value Date   RETICCTPCT 1.0 05/12/2015   RBC 5.21 02/13/2018   RETICCTABS 48.9 05/12/2015   No results found for: KPAFRELGTCHN, LAMBDASER,  KAPLAMBRATIO No results found for: IGGSERUM, IGA, IGMSERUM No results found for: Odetta Pink, SPEI   Chemistry      Component Value Date/Time   NA 138 12/19/2017 1401   NA 141 07/25/2017 1131   NA 140 12/04/2016 0747   K 4.2 12/19/2017 1401   K 3.9 07/25/2017 1131   K 4.2 12/04/2016 0747   CL 104 12/19/2017 1401   CL 108 07/25/2017 1131   CO2 28 12/19/2017 1401   CO2 27 07/25/2017 1131   CO2 25 12/04/2016 0747   BUN 11 12/19/2017 1401   BUN 12 07/25/2017 1131   BUN 13.2 12/04/2016 0747   CREATININE 0.74 12/19/2017 1401   CREATININE 0.7 07/25/2017 1131   CREATININE 0.7 12/04/2016 0747      Component Value Date/Time   CALCIUM 9.7 12/19/2017 1401   CALCIUM 9.4 07/25/2017 1131   CALCIUM 9.3 12/04/2016 0747   ALKPHOS 73 12/19/2017 1401   ALKPHOS 65 07/25/2017 1131   ALKPHOS 64 12/04/2016 0747   AST 21 12/19/2017 1401   AST 17 12/04/2016 0747   ALT 11 12/19/2017 1401   ALT 20 07/25/2017 1131   ALT 10 12/04/2016 0747   BILITOT 0.5 12/19/2017 1401   BILITOT 0.73 12/04/2016 0747      Impression and Plan: Pamela Ray is a very pleasant 52 yo caucasian female with 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 She had a lumpectomy followed by radiation and is now on Fareston. She also has intermittent iron deficiency.  We will see what her iron studies show and bring her back in for an infusion.  We will plan to see her back in another 3 months for follow-up.  She will contact our office with any questions or concerns. We can certainly see her sooner if need be.   Pamela Peace, NP 6/28/20193:53 PM

## 2018-02-14 LAB — VITAMIN D 25 HYDROXY (VIT D DEFICIENCY, FRACTURES): VIT D 25 HYDROXY: 53.8 ng/mL (ref 30.0–100.0)

## 2018-02-16 LAB — IRON AND TIBC
Iron: 82 ug/dL (ref 41–142)
Saturation Ratios: 25 % (ref 21–57)
TIBC: 331 ug/dL (ref 236–444)
UIBC: 249 ug/dL

## 2018-02-16 LAB — FERRITIN: FERRITIN: 441 ng/mL — AB (ref 11–307)

## 2018-02-25 ENCOUNTER — Ambulatory Visit (INDEPENDENT_AMBULATORY_CARE_PROVIDER_SITE_OTHER): Payer: 59 | Admitting: Psychology

## 2018-02-25 DIAGNOSIS — F332 Major depressive disorder, recurrent severe without psychotic features: Secondary | ICD-10-CM | POA: Diagnosis not present

## 2018-02-25 DIAGNOSIS — F411 Generalized anxiety disorder: Secondary | ICD-10-CM

## 2018-02-26 ENCOUNTER — Ambulatory Visit: Payer: 59 | Admitting: Psychology

## 2018-03-05 ENCOUNTER — Ambulatory Visit: Payer: 59 | Admitting: Psychology

## 2018-03-12 ENCOUNTER — Ambulatory Visit: Payer: 59 | Admitting: Psychology

## 2018-03-19 ENCOUNTER — Ambulatory Visit: Payer: 59 | Admitting: Psychology

## 2018-03-20 ENCOUNTER — Ambulatory Visit: Payer: Self-pay | Admitting: Family

## 2018-03-20 ENCOUNTER — Other Ambulatory Visit: Payer: Self-pay

## 2018-03-26 ENCOUNTER — Ambulatory Visit (INDEPENDENT_AMBULATORY_CARE_PROVIDER_SITE_OTHER): Payer: 59 | Admitting: Psychology

## 2018-03-26 DIAGNOSIS — F411 Generalized anxiety disorder: Secondary | ICD-10-CM | POA: Diagnosis not present

## 2018-03-26 DIAGNOSIS — F332 Major depressive disorder, recurrent severe without psychotic features: Secondary | ICD-10-CM

## 2018-04-02 ENCOUNTER — Ambulatory Visit: Payer: 59 | Admitting: Psychology

## 2018-04-09 ENCOUNTER — Ambulatory Visit: Payer: 59 | Admitting: Psychology

## 2018-04-15 ENCOUNTER — Other Ambulatory Visit: Payer: Self-pay | Admitting: Hematology & Oncology

## 2018-04-15 DIAGNOSIS — R739 Hyperglycemia, unspecified: Secondary | ICD-10-CM

## 2018-04-15 DIAGNOSIS — E559 Vitamin D deficiency, unspecified: Secondary | ICD-10-CM

## 2018-04-15 DIAGNOSIS — M810 Age-related osteoporosis without current pathological fracture: Secondary | ICD-10-CM

## 2018-04-15 DIAGNOSIS — E7801 Familial hypercholesterolemia: Secondary | ICD-10-CM

## 2018-04-16 ENCOUNTER — Ambulatory Visit (INDEPENDENT_AMBULATORY_CARE_PROVIDER_SITE_OTHER): Payer: 59 | Admitting: Psychology

## 2018-04-16 DIAGNOSIS — F332 Major depressive disorder, recurrent severe without psychotic features: Secondary | ICD-10-CM | POA: Diagnosis not present

## 2018-04-16 DIAGNOSIS — F411 Generalized anxiety disorder: Secondary | ICD-10-CM | POA: Diagnosis not present

## 2018-04-23 ENCOUNTER — Ambulatory Visit (INDEPENDENT_AMBULATORY_CARE_PROVIDER_SITE_OTHER): Payer: 59 | Admitting: Psychology

## 2018-04-23 DIAGNOSIS — F332 Major depressive disorder, recurrent severe without psychotic features: Secondary | ICD-10-CM

## 2018-04-23 DIAGNOSIS — F411 Generalized anxiety disorder: Secondary | ICD-10-CM

## 2018-04-27 ENCOUNTER — Other Ambulatory Visit: Payer: Self-pay | Admitting: *Deleted

## 2018-04-27 MED ORDER — TOREMIFENE CITRATE 60 MG PO TABS: 60.0000 mg | ORAL_TABLET | Freq: Every day | ORAL | 0 refills | Status: DC

## 2018-04-28 ENCOUNTER — Ambulatory Visit (INDEPENDENT_AMBULATORY_CARE_PROVIDER_SITE_OTHER): Payer: 59 | Admitting: Psychology

## 2018-04-28 DIAGNOSIS — F332 Major depressive disorder, recurrent severe without psychotic features: Secondary | ICD-10-CM | POA: Diagnosis not present

## 2018-04-28 DIAGNOSIS — F411 Generalized anxiety disorder: Secondary | ICD-10-CM | POA: Diagnosis not present

## 2018-04-30 ENCOUNTER — Ambulatory Visit: Payer: 59 | Admitting: Psychology

## 2018-05-07 ENCOUNTER — Ambulatory Visit: Payer: 59 | Admitting: Psychology

## 2018-05-14 ENCOUNTER — Ambulatory Visit: Payer: 59 | Admitting: Psychology

## 2018-05-21 ENCOUNTER — Ambulatory Visit (INDEPENDENT_AMBULATORY_CARE_PROVIDER_SITE_OTHER): Payer: 59 | Admitting: Psychology

## 2018-05-21 DIAGNOSIS — F332 Major depressive disorder, recurrent severe without psychotic features: Secondary | ICD-10-CM | POA: Diagnosis not present

## 2018-05-21 DIAGNOSIS — F411 Generalized anxiety disorder: Secondary | ICD-10-CM

## 2018-05-22 ENCOUNTER — Inpatient Hospital Stay: Payer: 59 | Attending: Hematology & Oncology

## 2018-05-22 ENCOUNTER — Encounter: Payer: Self-pay | Admitting: Family

## 2018-05-22 ENCOUNTER — Inpatient Hospital Stay (HOSPITAL_BASED_OUTPATIENT_CLINIC_OR_DEPARTMENT_OTHER): Payer: 59 | Admitting: Family

## 2018-05-22 ENCOUNTER — Other Ambulatory Visit: Payer: Self-pay

## 2018-05-22 VITALS — BP 108/73 | HR 83 | Temp 98.1°F | Resp 83 | Wt 145.0 lb

## 2018-05-22 DIAGNOSIS — Z79811 Long term (current) use of aromatase inhibitors: Secondary | ICD-10-CM

## 2018-05-22 DIAGNOSIS — D509 Iron deficiency anemia, unspecified: Secondary | ICD-10-CM | POA: Diagnosis not present

## 2018-05-22 DIAGNOSIS — D0512 Intraductal carcinoma in situ of left breast: Secondary | ICD-10-CM

## 2018-05-22 DIAGNOSIS — Z79899 Other long term (current) drug therapy: Secondary | ICD-10-CM | POA: Insufficient documentation

## 2018-05-22 DIAGNOSIS — E559 Vitamin D deficiency, unspecified: Secondary | ICD-10-CM

## 2018-05-22 DIAGNOSIS — N951 Menopausal and female climacteric states: Secondary | ICD-10-CM

## 2018-05-22 DIAGNOSIS — R61 Generalized hyperhidrosis: Secondary | ICD-10-CM

## 2018-05-22 LAB — CBC WITH DIFFERENTIAL (CANCER CENTER ONLY)
BASOS PCT: 0 %
Basophils Absolute: 0 10*3/uL (ref 0.0–0.1)
Eosinophils Absolute: 0.2 10*3/uL (ref 0.0–0.5)
Eosinophils Relative: 4 %
HCT: 42.4 % (ref 34.8–46.6)
HEMOGLOBIN: 14.3 g/dL (ref 11.6–15.9)
LYMPHS ABS: 1.5 10*3/uL (ref 0.9–3.3)
Lymphocytes Relative: 29 %
MCH: 30 pg (ref 26.0–34.0)
MCHC: 33.7 g/dL (ref 32.0–36.0)
MCV: 88.9 fL (ref 81.0–101.0)
MONOS PCT: 9 %
Monocytes Absolute: 0.4 10*3/uL (ref 0.1–0.9)
NEUTROS ABS: 3 10*3/uL (ref 1.5–6.5)
NEUTROS PCT: 58 %
Platelet Count: 204 10*3/uL (ref 145–400)
RBC: 4.77 MIL/uL (ref 3.70–5.32)
RDW: 12.9 % (ref 11.1–15.7)
WBC Count: 5.2 10*3/uL (ref 3.9–10.0)

## 2018-05-22 LAB — CMP (CANCER CENTER ONLY)
ALBUMIN: 3.4 g/dL — AB (ref 3.5–5.0)
ALK PHOS: 60 U/L (ref 26–84)
ALT: 20 U/L (ref 10–47)
AST: 24 U/L (ref 11–38)
Anion gap: 1 — ABNORMAL LOW (ref 5–15)
BUN: 13 mg/dL (ref 7–22)
CHLORIDE: 106 mmol/L (ref 98–108)
CO2: 30 mmol/L (ref 18–33)
CREATININE: 0.8 mg/dL (ref 0.60–1.20)
Calcium: 9.5 mg/dL (ref 8.0–10.3)
Glucose, Bld: 108 mg/dL (ref 73–118)
Potassium: 3.5 mmol/L (ref 3.3–4.7)
SODIUM: 137 mmol/L (ref 128–145)
TOTAL PROTEIN: 6.8 g/dL (ref 6.4–8.1)
Total Bilirubin: 0.6 mg/dL (ref 0.2–1.6)

## 2018-05-22 NOTE — Progress Notes (Signed)
Hematology and Oncology Follow Up Visit  Pamela Ray [id number] 1966/01/20 52 y.o. 05/22/2018   Principle Diagnosis:  DCIS of the left breast Recurrent iron deficiency anemia  Current Therapy:   IV iron with Feraheme as indicated - last doseMay 2019 Fareston 60mg  po q day   Interim History: Pamela Ray is here today for follow-up. She feels like she is starting to go through menopause. She has not had a cycle since July and is was sporadic before then. She is having hot flashes and night sweats. She wants to hold off on trying anything such as Pamela Ray for now. She is currently taking Amberen.  Breast exam today was unremarkable.  No episodes of bleeding, no bruising or petechiae.  No fever, chills, n/v, cough, rash, dizziness, SOB, chest pain, palpitations, abdominal pain or changes in bowel or bladder habits.  No swelling, tenderness, numbness or tingling in her extremities. No c/o pain.  No lymphadenopathy.  She has maintained a good appetite and is staying well hydrated. Her weight is stable.   ECOG Performance Status: 1 - Symptomatic but completely ambulatory  Medications:  Allergies as of 05/22/2018      Reactions   Amoxicillin Itching   Cephalexin Itching   Cephalexin Itching   Penicillins Hives      Medication List        Accurate as of 05/22/18  3:27 PM. Always use your most recent med list.          ALPRAZolam 0.25 MG tablet Commonly known as:  XANAX Take 1 tablet (0.25 mg total) by mouth 3 (three) times daily as needed for anxiety.   BOTOX 100 units Solr injection Generic drug:  botulinum toxin Type A Every 3 months   ergocalciferol 50000 units capsule Commonly known as:  VITAMIN D2 Take 1 capsule (50,000 Units total) by mouth 2 (two) times a week.   FARESTON 60 MG tablet Generic drug:  Toremifene Citrate TAKE 1 TABLET BY MOUTH  DAILY   Toremifene Citrate 60 MG tablet Commonly known as:  FARESTON Take 1 tablet (60 mg total) by mouth daily.     hydrocortisone-pramoxine 2.5-1 % rectal cream Commonly known as:  ANALPRAM-HC   ibuprofen 200 MG tablet Commonly known as:  ADVIL,MOTRIN Take 200 mg by mouth every 6 (six) hours as needed.   metaxalone 800 MG tablet Commonly known as:  SKELAXIN Take 800 mg by mouth 2 (two) times daily as needed. For migraines   SUMAtriptan 100 MG tablet Commonly known as:  IMITREX Take 100 mg by mouth every 2 (two) hours as needed. Take 1 tablet at onset of migraine  may repeat in 2 hours  Limit use to 2 days a week       Allergies:  Allergies  Allergen Reactions  . Amoxicillin Itching  . Cephalexin Itching  . Cephalexin Itching  . Penicillins Hives    Past Medical History, Surgical history, Social history, and Family History were reviewed and updated.  Review of Systems: All other 10 point review of systems is negative.   Physical Exam:  weight is 145 lb (65.8 kg). Her oral temperature is 98.1 F (36.7 C). Her blood pressure is 108/73 and her pulse is 83. Her respiration is 83 (abnormal) and oxygen saturation is 100%.   Wt Readings from Last 3 Encounters:  05/22/18 145 lb (65.8 kg)  02/13/18 142 lb (64.4 kg)  12/19/17 141 lb (64 kg)    Ocular: Sclerae unicteric, pupils equal, round and reactive to light  Ear-nose-throat: Oropharynx clear, dentition fair Lymphatic: No cervical, supraclavicular or axillary adenopathy Lungs no rales or rhonchi, good excursion bilaterally Heart regular rate and rhythm, no murmur appreciated Abd soft, nontender, positive bowel sounds, no liver or spleen tip palpated on exam, no fluid wave  MSK no focal spinal tenderness, no joint edema Neuro: non-focal, well-oriented, appropriate affect Breasts: No changes, no mass, lesion or rash noted  Lab Results  Component Value Date   WBC 5.2 05/22/2018   HGB 14.3 05/22/2018   HCT 42.4 05/22/2018   MCV 88.9 05/22/2018   PLT 204 05/22/2018   Lab Results  Component Value Date   FERRITIN 441 (H)  02/13/2018   IRON 82 02/13/2018   TIBC 331 02/13/2018   UIBC 249 02/13/2018   IRONPCTSAT 25 02/13/2018   Lab Results  Component Value Date   RETICCTPCT 1.0 05/12/2015   RBC 4.77 05/22/2018   RETICCTABS 48.9 05/12/2015   No results found for: KPAFRELGTCHN, LAMBDASER, KAPLAMBRATIO No results found for: IGGSERUM, IGA, IGMSERUM No results found for: Odetta Pink, SPEI   Chemistry      Component Value Date/Time   NA 137 05/22/2018 1455   NA 141 07/25/2017 1131   NA 140 12/04/2016 0747   K 3.5 05/22/2018 1455   K 3.9 07/25/2017 1131   K 4.2 12/04/2016 0747   CL 106 05/22/2018 1455   CL 108 07/25/2017 1131   CO2 30 05/22/2018 1455   CO2 27 07/25/2017 1131   CO2 25 12/04/2016 0747   BUN 13 05/22/2018 1455   BUN 12 07/25/2017 1131   BUN 13.2 12/04/2016 0747   CREATININE 0.80 05/22/2018 1455   CREATININE 0.7 07/25/2017 1131   CREATININE 0.7 12/04/2016 0747      Component Value Date/Time   CALCIUM 9.5 05/22/2018 1455   CALCIUM 9.4 07/25/2017 1131   CALCIUM 9.3 12/04/2016 0747   ALKPHOS 60 05/22/2018 1455   ALKPHOS 65 07/25/2017 1131   ALKPHOS 64 12/04/2016 0747   AST 24 05/22/2018 1455   AST 17 12/04/2016 0747   ALT 20 05/22/2018 1455   ALT 20 07/25/2017 1131   ALT 10 12/04/2016 0747   BILITOT 0.6 05/22/2018 1455   BILITOT 0.73 12/04/2016 0747       Impression and Plan: 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 She had a lumpectomy followed by radiation and is now on Fareston. She continues to do well but feels that she is now starting to go through the change of life.  We will see what her iron studies and hormone levels show. We will bring her back in for IV iron if needed.  We will plan to see her back in another 6 months for follow-up.  She will contact our office with any questions or concerns. We can certainly see her sooner if need  be.   Pamela Peace, NP 10/4/20193:27 PM

## 2018-05-23 LAB — VITAMIN D 25 HYDROXY (VIT D DEFICIENCY, FRACTURES): Vit D, 25-Hydroxy: 44 ng/mL (ref 30.0–100.0)

## 2018-05-23 LAB — LUTEINIZING HORMONE: LH: 33.7 m[IU]/mL

## 2018-05-23 LAB — FOLLICLE STIMULATING HORMONE: FSH: 15.1 m[IU]/mL

## 2018-05-25 LAB — IRON AND TIBC
IRON: 89 ug/dL (ref 41–142)
SATURATION RATIOS: 25 % (ref 21–57)
TIBC: 358 ug/dL (ref 236–444)
UIBC: 269 ug/dL

## 2018-05-25 LAB — FERRITIN: Ferritin: 212 ng/mL (ref 11–307)

## 2018-05-28 ENCOUNTER — Ambulatory Visit (INDEPENDENT_AMBULATORY_CARE_PROVIDER_SITE_OTHER): Payer: 59 | Admitting: Psychology

## 2018-05-28 DIAGNOSIS — F332 Major depressive disorder, recurrent severe without psychotic features: Secondary | ICD-10-CM | POA: Diagnosis not present

## 2018-05-28 DIAGNOSIS — F411 Generalized anxiety disorder: Secondary | ICD-10-CM

## 2018-06-03 LAB — ESTRADIOL, ULTRA SENS: Estradiol, Sensitive: 376 pg/mL

## 2018-06-04 ENCOUNTER — Ambulatory Visit: Payer: 59 | Admitting: Psychology

## 2018-06-11 ENCOUNTER — Ambulatory Visit: Payer: 59 | Admitting: Psychology

## 2018-06-18 ENCOUNTER — Ambulatory Visit: Payer: 59 | Admitting: Psychology

## 2018-06-25 ENCOUNTER — Ambulatory Visit (INDEPENDENT_AMBULATORY_CARE_PROVIDER_SITE_OTHER): Payer: 59 | Admitting: Psychology

## 2018-06-25 DIAGNOSIS — F332 Major depressive disorder, recurrent severe without psychotic features: Secondary | ICD-10-CM | POA: Diagnosis not present

## 2018-06-25 DIAGNOSIS — F411 Generalized anxiety disorder: Secondary | ICD-10-CM

## 2018-07-02 ENCOUNTER — Ambulatory Visit: Payer: 59 | Admitting: Psychology

## 2018-07-09 ENCOUNTER — Ambulatory Visit (INDEPENDENT_AMBULATORY_CARE_PROVIDER_SITE_OTHER): Payer: 59 | Admitting: Psychology

## 2018-07-09 DIAGNOSIS — F332 Major depressive disorder, recurrent severe without psychotic features: Secondary | ICD-10-CM

## 2018-07-09 DIAGNOSIS — F411 Generalized anxiety disorder: Secondary | ICD-10-CM | POA: Diagnosis not present

## 2018-07-23 ENCOUNTER — Ambulatory Visit: Payer: 59 | Admitting: Psychology

## 2018-07-30 ENCOUNTER — Ambulatory Visit: Payer: 59 | Admitting: Psychology

## 2018-08-06 ENCOUNTER — Other Ambulatory Visit: Payer: Self-pay | Admitting: Family

## 2018-08-06 ENCOUNTER — Ambulatory Visit (INDEPENDENT_AMBULATORY_CARE_PROVIDER_SITE_OTHER): Payer: 59 | Admitting: Psychology

## 2018-08-06 DIAGNOSIS — M818 Other osteoporosis without current pathological fracture: Secondary | ICD-10-CM

## 2018-08-06 DIAGNOSIS — E782 Mixed hyperlipidemia: Secondary | ICD-10-CM

## 2018-08-06 DIAGNOSIS — T386X5A Adverse effect of antigonadotrophins, antiestrogens, antiandrogens, not elsewhere classified, initial encounter: Secondary | ICD-10-CM

## 2018-08-06 DIAGNOSIS — F411 Generalized anxiety disorder: Secondary | ICD-10-CM | POA: Diagnosis not present

## 2018-08-06 DIAGNOSIS — R739 Hyperglycemia, unspecified: Secondary | ICD-10-CM

## 2018-08-06 DIAGNOSIS — D508 Other iron deficiency anemias: Secondary | ICD-10-CM

## 2018-08-06 DIAGNOSIS — D509 Iron deficiency anemia, unspecified: Secondary | ICD-10-CM

## 2018-08-06 DIAGNOSIS — E559 Vitamin D deficiency, unspecified: Secondary | ICD-10-CM

## 2018-08-06 DIAGNOSIS — F064 Anxiety disorder due to known physiological condition: Secondary | ICD-10-CM

## 2018-08-06 DIAGNOSIS — M810 Age-related osteoporosis without current pathological fracture: Secondary | ICD-10-CM

## 2018-08-06 DIAGNOSIS — E7801 Familial hypercholesterolemia: Secondary | ICD-10-CM

## 2018-08-13 ENCOUNTER — Ambulatory Visit: Payer: 59 | Admitting: Psychology

## 2018-08-20 ENCOUNTER — Ambulatory Visit: Payer: 59 | Admitting: Psychology

## 2018-08-27 ENCOUNTER — Ambulatory Visit (INDEPENDENT_AMBULATORY_CARE_PROVIDER_SITE_OTHER): Payer: 59 | Admitting: Psychology

## 2018-08-27 DIAGNOSIS — F411 Generalized anxiety disorder: Secondary | ICD-10-CM | POA: Diagnosis not present

## 2018-08-27 DIAGNOSIS — F332 Major depressive disorder, recurrent severe without psychotic features: Secondary | ICD-10-CM

## 2018-09-03 ENCOUNTER — Ambulatory Visit (INDEPENDENT_AMBULATORY_CARE_PROVIDER_SITE_OTHER): Payer: 59 | Admitting: Psychology

## 2018-09-03 DIAGNOSIS — F411 Generalized anxiety disorder: Secondary | ICD-10-CM

## 2018-09-03 DIAGNOSIS — F332 Major depressive disorder, recurrent severe without psychotic features: Secondary | ICD-10-CM

## 2018-09-10 ENCOUNTER — Ambulatory Visit: Payer: 59 | Admitting: Psychology

## 2018-09-17 ENCOUNTER — Ambulatory Visit: Payer: 59 | Admitting: Psychology

## 2018-09-24 ENCOUNTER — Ambulatory Visit (INDEPENDENT_AMBULATORY_CARE_PROVIDER_SITE_OTHER): Payer: 59 | Admitting: Psychology

## 2018-09-24 DIAGNOSIS — F332 Major depressive disorder, recurrent severe without psychotic features: Secondary | ICD-10-CM

## 2018-09-24 DIAGNOSIS — F411 Generalized anxiety disorder: Secondary | ICD-10-CM | POA: Diagnosis not present

## 2018-10-01 ENCOUNTER — Ambulatory Visit (INDEPENDENT_AMBULATORY_CARE_PROVIDER_SITE_OTHER): Payer: 59 | Admitting: Psychology

## 2018-10-01 DIAGNOSIS — F411 Generalized anxiety disorder: Secondary | ICD-10-CM

## 2018-10-01 DIAGNOSIS — F332 Major depressive disorder, recurrent severe without psychotic features: Secondary | ICD-10-CM

## 2018-10-08 ENCOUNTER — Ambulatory Visit: Payer: 59 | Admitting: Psychology

## 2018-10-08 LAB — HM PAP SMEAR

## 2018-10-15 ENCOUNTER — Ambulatory Visit: Payer: 59 | Admitting: Psychology

## 2018-10-22 ENCOUNTER — Ambulatory Visit (INDEPENDENT_AMBULATORY_CARE_PROVIDER_SITE_OTHER): Payer: 59 | Admitting: Psychology

## 2018-10-22 DIAGNOSIS — F411 Generalized anxiety disorder: Secondary | ICD-10-CM | POA: Diagnosis not present

## 2018-10-22 DIAGNOSIS — F332 Major depressive disorder, recurrent severe without psychotic features: Secondary | ICD-10-CM

## 2018-10-29 ENCOUNTER — Ambulatory Visit (INDEPENDENT_AMBULATORY_CARE_PROVIDER_SITE_OTHER): Payer: 59 | Admitting: Psychology

## 2018-10-29 DIAGNOSIS — F332 Major depressive disorder, recurrent severe without psychotic features: Secondary | ICD-10-CM | POA: Diagnosis not present

## 2018-10-29 DIAGNOSIS — F411 Generalized anxiety disorder: Secondary | ICD-10-CM | POA: Diagnosis not present

## 2018-11-05 ENCOUNTER — Ambulatory Visit: Payer: 59 | Admitting: Psychology

## 2018-11-06 ENCOUNTER — Ambulatory Visit (INDEPENDENT_AMBULATORY_CARE_PROVIDER_SITE_OTHER): Payer: 59 | Admitting: Psychology

## 2018-11-06 DIAGNOSIS — F411 Generalized anxiety disorder: Secondary | ICD-10-CM | POA: Diagnosis not present

## 2018-11-06 DIAGNOSIS — F332 Major depressive disorder, recurrent severe without psychotic features: Secondary | ICD-10-CM | POA: Diagnosis not present

## 2018-11-12 ENCOUNTER — Ambulatory Visit (INDEPENDENT_AMBULATORY_CARE_PROVIDER_SITE_OTHER): Payer: 59 | Admitting: Psychology

## 2018-11-12 DIAGNOSIS — F411 Generalized anxiety disorder: Secondary | ICD-10-CM

## 2018-11-12 DIAGNOSIS — F332 Major depressive disorder, recurrent severe without psychotic features: Secondary | ICD-10-CM

## 2018-11-19 ENCOUNTER — Ambulatory Visit (INDEPENDENT_AMBULATORY_CARE_PROVIDER_SITE_OTHER): Payer: 59 | Admitting: Psychology

## 2018-11-19 DIAGNOSIS — F332 Major depressive disorder, recurrent severe without psychotic features: Secondary | ICD-10-CM

## 2018-11-19 DIAGNOSIS — F411 Generalized anxiety disorder: Secondary | ICD-10-CM

## 2018-11-26 ENCOUNTER — Ambulatory Visit: Payer: 59 | Admitting: Psychology

## 2018-11-27 ENCOUNTER — Ambulatory Visit (INDEPENDENT_AMBULATORY_CARE_PROVIDER_SITE_OTHER): Payer: 59 | Admitting: Psychology

## 2018-11-27 DIAGNOSIS — F411 Generalized anxiety disorder: Secondary | ICD-10-CM | POA: Diagnosis not present

## 2018-11-27 DIAGNOSIS — F332 Major depressive disorder, recurrent severe without psychotic features: Secondary | ICD-10-CM

## 2018-12-03 ENCOUNTER — Ambulatory Visit (INDEPENDENT_AMBULATORY_CARE_PROVIDER_SITE_OTHER): Payer: 59 | Admitting: Psychology

## 2018-12-03 DIAGNOSIS — F411 Generalized anxiety disorder: Secondary | ICD-10-CM | POA: Diagnosis not present

## 2018-12-03 DIAGNOSIS — F332 Major depressive disorder, recurrent severe without psychotic features: Secondary | ICD-10-CM | POA: Diagnosis not present

## 2018-12-10 ENCOUNTER — Ambulatory Visit (INDEPENDENT_AMBULATORY_CARE_PROVIDER_SITE_OTHER): Payer: 59 | Admitting: Psychology

## 2018-12-10 DIAGNOSIS — F411 Generalized anxiety disorder: Secondary | ICD-10-CM | POA: Diagnosis not present

## 2018-12-10 DIAGNOSIS — F332 Major depressive disorder, recurrent severe without psychotic features: Secondary | ICD-10-CM | POA: Diagnosis not present

## 2018-12-17 ENCOUNTER — Ambulatory Visit: Payer: 59 | Admitting: Psychology

## 2018-12-22 ENCOUNTER — Ambulatory Visit (INDEPENDENT_AMBULATORY_CARE_PROVIDER_SITE_OTHER): Payer: 59 | Admitting: Psychology

## 2018-12-22 DIAGNOSIS — F332 Major depressive disorder, recurrent severe without psychotic features: Secondary | ICD-10-CM | POA: Diagnosis not present

## 2018-12-22 DIAGNOSIS — F411 Generalized anxiety disorder: Secondary | ICD-10-CM | POA: Diagnosis not present

## 2018-12-24 ENCOUNTER — Ambulatory Visit (INDEPENDENT_AMBULATORY_CARE_PROVIDER_SITE_OTHER): Payer: 59 | Admitting: Psychology

## 2018-12-24 DIAGNOSIS — F332 Major depressive disorder, recurrent severe without psychotic features: Secondary | ICD-10-CM | POA: Diagnosis not present

## 2018-12-24 DIAGNOSIS — F411 Generalized anxiety disorder: Secondary | ICD-10-CM | POA: Diagnosis not present

## 2018-12-31 ENCOUNTER — Ambulatory Visit (INDEPENDENT_AMBULATORY_CARE_PROVIDER_SITE_OTHER): Payer: 59 | Admitting: Psychology

## 2018-12-31 DIAGNOSIS — F332 Major depressive disorder, recurrent severe without psychotic features: Secondary | ICD-10-CM

## 2018-12-31 DIAGNOSIS — F411 Generalized anxiety disorder: Secondary | ICD-10-CM | POA: Diagnosis not present

## 2019-01-07 ENCOUNTER — Ambulatory Visit: Payer: 59 | Admitting: Psychology

## 2019-01-14 ENCOUNTER — Ambulatory Visit (INDEPENDENT_AMBULATORY_CARE_PROVIDER_SITE_OTHER): Payer: 59 | Admitting: Psychology

## 2019-01-14 DIAGNOSIS — F411 Generalized anxiety disorder: Secondary | ICD-10-CM

## 2019-01-14 DIAGNOSIS — F332 Major depressive disorder, recurrent severe without psychotic features: Secondary | ICD-10-CM | POA: Diagnosis not present

## 2019-01-21 ENCOUNTER — Ambulatory Visit (INDEPENDENT_AMBULATORY_CARE_PROVIDER_SITE_OTHER): Payer: 59 | Admitting: Psychology

## 2019-01-21 DIAGNOSIS — F332 Major depressive disorder, recurrent severe without psychotic features: Secondary | ICD-10-CM | POA: Diagnosis not present

## 2019-01-21 DIAGNOSIS — F411 Generalized anxiety disorder: Secondary | ICD-10-CM | POA: Diagnosis not present

## 2019-01-28 ENCOUNTER — Ambulatory Visit: Payer: 59 | Admitting: Psychology

## 2019-02-04 ENCOUNTER — Telehealth: Payer: Self-pay | Admitting: Hematology & Oncology

## 2019-02-04 ENCOUNTER — Ambulatory Visit (INDEPENDENT_AMBULATORY_CARE_PROVIDER_SITE_OTHER): Payer: 59 | Admitting: Psychology

## 2019-02-04 DIAGNOSIS — F411 Generalized anxiety disorder: Secondary | ICD-10-CM | POA: Diagnosis not present

## 2019-02-04 DIAGNOSIS — F332 Major depressive disorder, recurrent severe without psychotic features: Secondary | ICD-10-CM

## 2019-02-04 NOTE — Telephone Encounter (Signed)
Called and LMVM for patient regarding appointments scheduled per 10/4 los.  Appointments were never scheduled

## 2019-02-04 NOTE — Telephone Encounter (Signed)
Tried calling patient back regarding appointment and needing to r/s from 6/22 due to patient being out of town.  She will call us back to reschedule

## 2019-02-08 ENCOUNTER — Ambulatory Visit: Payer: 59 | Admitting: Family

## 2019-02-08 ENCOUNTER — Other Ambulatory Visit: Payer: 59

## 2019-02-11 ENCOUNTER — Ambulatory Visit (INDEPENDENT_AMBULATORY_CARE_PROVIDER_SITE_OTHER): Payer: 59 | Admitting: Psychology

## 2019-02-11 DIAGNOSIS — F332 Major depressive disorder, recurrent severe without psychotic features: Secondary | ICD-10-CM

## 2019-02-11 DIAGNOSIS — F411 Generalized anxiety disorder: Secondary | ICD-10-CM | POA: Diagnosis not present

## 2019-02-18 ENCOUNTER — Encounter: Payer: Self-pay | Admitting: Family

## 2019-02-18 ENCOUNTER — Telehealth: Payer: Self-pay | Admitting: Family

## 2019-02-18 ENCOUNTER — Inpatient Hospital Stay: Payer: 59 | Attending: Family

## 2019-02-18 ENCOUNTER — Inpatient Hospital Stay (HOSPITAL_BASED_OUTPATIENT_CLINIC_OR_DEPARTMENT_OTHER): Payer: 59 | Admitting: Family

## 2019-02-18 ENCOUNTER — Other Ambulatory Visit: Payer: Self-pay

## 2019-02-18 ENCOUNTER — Ambulatory Visit (INDEPENDENT_AMBULATORY_CARE_PROVIDER_SITE_OTHER): Payer: 59 | Admitting: Psychology

## 2019-02-18 VITALS — BP 116/69 | HR 80 | Temp 98.0°F | Resp 18 | Ht 66.0 in | Wt 148.8 lb

## 2019-02-18 DIAGNOSIS — E7801 Familial hypercholesterolemia: Secondary | ICD-10-CM

## 2019-02-18 DIAGNOSIS — R0609 Other forms of dyspnea: Secondary | ICD-10-CM | POA: Diagnosis not present

## 2019-02-18 DIAGNOSIS — F332 Major depressive disorder, recurrent severe without psychotic features: Secondary | ICD-10-CM

## 2019-02-18 DIAGNOSIS — D509 Iron deficiency anemia, unspecified: Secondary | ICD-10-CM

## 2019-02-18 DIAGNOSIS — R42 Dizziness and giddiness: Secondary | ICD-10-CM | POA: Insufficient documentation

## 2019-02-18 DIAGNOSIS — F411 Generalized anxiety disorder: Secondary | ICD-10-CM

## 2019-02-18 DIAGNOSIS — E559 Vitamin D deficiency, unspecified: Secondary | ICD-10-CM

## 2019-02-18 DIAGNOSIS — D0512 Intraductal carcinoma in situ of left breast: Secondary | ICD-10-CM

## 2019-02-18 DIAGNOSIS — Z79811 Long term (current) use of aromatase inhibitors: Secondary | ICD-10-CM

## 2019-02-18 DIAGNOSIS — E611 Iron deficiency: Secondary | ICD-10-CM | POA: Insufficient documentation

## 2019-02-18 DIAGNOSIS — D508 Other iron deficiency anemias: Secondary | ICD-10-CM

## 2019-02-18 DIAGNOSIS — K649 Unspecified hemorrhoids: Secondary | ICD-10-CM | POA: Diagnosis not present

## 2019-02-18 DIAGNOSIS — R5383 Other fatigue: Secondary | ICD-10-CM

## 2019-02-18 DIAGNOSIS — M810 Age-related osteoporosis without current pathological fracture: Secondary | ICD-10-CM

## 2019-02-18 DIAGNOSIS — E782 Mixed hyperlipidemia: Secondary | ICD-10-CM

## 2019-02-18 DIAGNOSIS — M818 Other osteoporosis without current pathological fracture: Secondary | ICD-10-CM

## 2019-02-18 DIAGNOSIS — R739 Hyperglycemia, unspecified: Secondary | ICD-10-CM

## 2019-02-18 DIAGNOSIS — F064 Anxiety disorder due to known physiological condition: Secondary | ICD-10-CM

## 2019-02-18 LAB — CBC WITH DIFFERENTIAL (CANCER CENTER ONLY)
Abs Immature Granulocytes: 0.01 10*3/uL (ref 0.00–0.07)
Basophils Absolute: 0 10*3/uL (ref 0.0–0.1)
Basophils Relative: 1 %
Eosinophils Absolute: 0.2 10*3/uL (ref 0.0–0.5)
Eosinophils Relative: 4 %
HCT: 43 % (ref 36.0–46.0)
Hemoglobin: 14.1 g/dL (ref 12.0–15.0)
Immature Granulocytes: 0 %
Lymphocytes Relative: 30 %
Lymphs Abs: 1.2 10*3/uL (ref 0.7–4.0)
MCH: 28.9 pg (ref 26.0–34.0)
MCHC: 32.8 g/dL (ref 30.0–36.0)
MCV: 88.1 fL (ref 80.0–100.0)
Monocytes Absolute: 0.5 10*3/uL (ref 0.1–1.0)
Monocytes Relative: 12 %
Neutro Abs: 2.2 10*3/uL (ref 1.7–7.7)
Neutrophils Relative %: 53 %
Platelet Count: 203 10*3/uL (ref 150–400)
RBC: 4.88 MIL/uL (ref 3.87–5.11)
RDW: 13.8 % (ref 11.5–15.5)
WBC Count: 4.1 10*3/uL (ref 4.0–10.5)
nRBC: 0 % (ref 0.0–0.2)

## 2019-02-18 LAB — CMP (CANCER CENTER ONLY)
ALT: 12 U/L (ref 0–44)
AST: 16 U/L (ref 15–41)
Albumin: 4.1 g/dL (ref 3.5–5.0)
Alkaline Phosphatase: 57 U/L (ref 38–126)
Anion gap: 8 (ref 5–15)
BUN: 16 mg/dL (ref 6–20)
CO2: 30 mmol/L (ref 22–32)
Calcium: 9.8 mg/dL (ref 8.9–10.3)
Chloride: 102 mmol/L (ref 98–111)
Creatinine: 0.74 mg/dL (ref 0.44–1.00)
GFR, Est AFR Am: >60 mL/min (ref >60)
GFR, Estimated: >60 mL/min (ref >60)
Glucose, Bld: 116 mg/dL — ABNORMAL HIGH (ref 70–99)
Potassium: 4 mmol/L (ref 3.5–5.1)
Sodium: 140 mmol/L (ref 135–145)
Total Bilirubin: 0.5 mg/dL (ref 0.3–1.2)
Total Protein: 6.6 g/dL (ref 6.5–8.1)

## 2019-02-18 LAB — FERRITIN: Ferritin: 87 ng/mL (ref 11–307)

## 2019-02-18 LAB — IRON AND TIBC
Iron: 81 ug/dL (ref 41–142)
Saturation Ratios: 22 % (ref 21–57)
TIBC: 363 ug/dL (ref 236–444)
UIBC: 282 ug/dL (ref 120–384)

## 2019-02-18 MED ORDER — ALPRAZOLAM 0.25 MG PO TABS: 0.2500 mg | ORAL_TABLET | Freq: Three times a day (TID) | ORAL | 1 refills | Status: DC | PRN

## 2019-02-18 NOTE — Telephone Encounter (Signed)
Appointments scheduled and spoke with patient per 7/2 los

## 2019-02-18 NOTE — Progress Notes (Signed)
Hematology and Oncology Follow Up Visit  Pamela Ray [id number] 1966-02-14 53 y.o. 02/18/2019   Principle Diagnosis:  DCIS of the left breast Recurrent iron deficiency anemia  Current Therapy:   IV iron as indicated - Injectafer (reacted to Feraheme) Fareston 60mg  po q day   Interim History:  Pamela Ray is here today for follow-up. She is symptomatic with fatigued, SOB with exertion, occasional dizziness and chest heaviness which she is not sure may be due to stress. She has been through a lot of heartbreak since we last saw her. She lost her mother in May and her cousin in April. Thankfully, she has a wonderful partner to help her through this and she is also seeing her therapist once a week.  She states that she has a little bleeding periodically from her hemorrhoids.  She states that her cycle is now irregular but still heavy when it comes on.  She has had no other blood loss. No bruising or petechiae.  She states that she is due for her mammogram and colonoscopy and will be calling this week to schedule.  No fever, chills, n/v, cough, rash, chest pain, palpitations, abdominal pain or changes in bowel or bladder habits.  No swelling, tenderness, numbness or tingling in her extremities.  No lymphadenopathy noted on exam.   ECOG Performance Status: 1 - Symptomatic but completely ambulatory  Medications:  Allergies as of 02/18/2019      Reactions   Amoxicillin Itching   Cephalexin Itching   Penicillins Hives      Medication List       Accurate as of February 18, 2019  9:36 AM. If you have any questions, ask your nurse or doctor.        STOP taking these medications   metaxalone 800 MG tablet Commonly known as: SKELAXIN Stopped by: Laverna Peace, NP     TAKE these medications   ALPRAZolam 0.25 MG tablet Commonly known as: XANAX TAKE 1 TABLET BY MOUTH THREE TIMES DAILY AS NEEDED FOR ANXIETY   Botox 100 units Solr injection Generic drug: botulinum toxin Type A Every 3  months   ergocalciferol 1.25 MG (50000 UT) capsule Commonly known as: VITAMIN D2 Take 1 capsule (50,000 Units total) by mouth 2 (two) times a week.   Fareston 60 MG tablet Generic drug: Toremifene Citrate TAKE 1 TABLET BY MOUTH  DAILY What changed: Another medication with the same name was removed. Continue taking this medication, and follow the directions you see here. Changed by: Laverna Peace, NP   hydrocortisone-pramoxine 2.5-1 % rectal cream Commonly known as: ANALPRAM-HC   ibuprofen 200 MG tablet Commonly known as: ADVIL Take 200 mg by mouth every 6 (six) hours as needed.   SUMAtriptan 100 MG tablet Commonly known as: IMITREX Take 100 mg by mouth every 2 (two) hours as needed. Take 1 tablet at onset of migraine  may repeat in 2 hours  Limit use to 2 days a week       Allergies:  Allergies  Allergen Reactions  . Amoxicillin Itching  . Cephalexin Itching  . Penicillins Hives    Past Medical History, Surgical history, Social history, and Family History were reviewed and updated.  Review of Systems: All other 10 point review of systems is negative.   Physical Exam:  height is 5\' 6"  (1.676 m) and weight is 148 lb 12.8 oz (67.5 kg). Her oral temperature is 98 F (36.7 C). Her blood pressure is 116/69 and her pulse is 80. Her respiration  is 18 and oxygen saturation is 100%.   Wt Readings from Last 3 Encounters:  02/18/19 148 lb 12.8 oz (67.5 kg)  05/22/18 145 lb (65.8 kg)  02/13/18 142 lb (64.4 kg)    Ocular: Sclerae unicteric, pupils equal, round and reactive to light Ear-nose-throat: Oropharynx clear, dentition fair Lymphatic: No cervical or supraclavicular adenopathy Lungs no rales or rhonchi, good excursion bilaterally Heart regular rate and rhythm, no murmur appreciated Abd soft, nontender, positive bowel sounds, no liver or spleen tip palpated on exam, no fluid wave  MSK no focal spinal tenderness, no joint edema Neuro: non-focal, well-oriented,  appropriate affect Breasts: No changed per patient  Lab Results  Component Value Date   WBC 4.1 02/18/2019   HGB 14.1 02/18/2019   HCT 43.0 02/18/2019   MCV 88.1 02/18/2019   PLT 203 02/18/2019   Lab Results  Component Value Date   FERRITIN 212 05/22/2018   IRON 89 05/22/2018   TIBC 358 05/22/2018   UIBC 269 05/22/2018   IRONPCTSAT 25 05/22/2018   Lab Results  Component Value Date   RETICCTPCT 1.0 05/12/2015   RBC 4.88 02/18/2019   RETICCTABS 48.9 05/12/2015   No results found for: KPAFRELGTCHN, LAMBDASER, KAPLAMBRATIO No results found for: IGGSERUM, IGA, IGMSERUM No results found for: Odetta Pink, SPEI   Chemistry      Component Value Date/Time   NA 140 02/18/2019 0820   NA 141 07/25/2017 1131   NA 140 12/04/2016 0747   K 4.0 02/18/2019 0820   K 3.9 07/25/2017 1131   K 4.2 12/04/2016 0747   CL 102 02/18/2019 0820   CL 108 07/25/2017 1131   CO2 30 02/18/2019 0820   CO2 27 07/25/2017 1131   CO2 25 12/04/2016 0747   BUN 16 02/18/2019 0820   BUN 12 07/25/2017 1131   BUN 13.2 12/04/2016 0747   CREATININE 0.74 02/18/2019 0820   CREATININE 0.7 07/25/2017 1131   CREATININE 0.7 12/04/2016 0747      Component Value Date/Time   CALCIUM 9.8 02/18/2019 0820   CALCIUM 9.4 07/25/2017 1131   CALCIUM 9.3 12/04/2016 0747   ALKPHOS 57 02/18/2019 0820   ALKPHOS 65 07/25/2017 1131   ALKPHOS 64 12/04/2016 0747   AST 16 02/18/2019 0820   AST 17 12/04/2016 0747   ALT 12 02/18/2019 0820   ALT 20 07/25/2017 1131   ALT 10 12/04/2016 0747   BILITOT 0.5 02/18/2019 0820   BILITOT 0.73 12/04/2016 0747       Impression and Plan: Pamela Ray is a very pleasant 53 yo caucasian female with 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 She had a lumpectomy followed by radiation and is now on Fareston. Thankfully she has dne well and there has been no evidence of recurrence. She also has intermittent iron deficiency.   She is symptomatic as mentioned above.  We will see what her iron studies show and bring her back in for infusion if needed.  We will plan to see her back in another 3-4 months.  She will contact our office with any questions or concerns. We can certainly see her sooner if needed.   Laverna Peace, NP 7/2/20209:36 AM

## 2019-02-19 LAB — VITAMIN D 25 HYDROXY (VIT D DEFICIENCY, FRACTURES): Vit D, 25-Hydroxy: 52.1 ng/mL (ref 30.0–100.0)

## 2019-02-23 ENCOUNTER — Telehealth: Payer: Self-pay | Admitting: *Deleted

## 2019-02-23 NOTE — Telephone Encounter (Signed)
Message received from patient requesting ferritin and hemoglobin results from last week.  Call placed back to patient and message left to notify patient per S. [place] NP that "labs look good and no iron is needed at this time."  Instructed pt to call office back with any questions or concerns.

## 2019-02-25 ENCOUNTER — Ambulatory Visit: Payer: 59 | Admitting: Psychology

## 2019-03-01 ENCOUNTER — Telehealth: Payer: Self-pay | Admitting: General Practice

## 2019-03-01 NOTE — Telephone Encounter (Signed)
Please advise 

## 2019-03-01 NOTE — Telephone Encounter (Signed)
Pt called in asking if Dr. Birdie Riddle would take her back as a pt? It has been over 4 yrs since she saw KT. Pt can be reached at the home # and states it's ok to LM if no answer.

## 2019-03-01 NOTE — Telephone Encounter (Signed)
Error a staff message has already been sent back last week.

## 2019-03-04 ENCOUNTER — Ambulatory Visit (INDEPENDENT_AMBULATORY_CARE_PROVIDER_SITE_OTHER): Payer: 59 | Admitting: Psychology

## 2019-03-04 DIAGNOSIS — F411 Generalized anxiety disorder: Secondary | ICD-10-CM

## 2019-03-04 DIAGNOSIS — F332 Major depressive disorder, recurrent severe without psychotic features: Secondary | ICD-10-CM | POA: Diagnosis not present

## 2019-03-05 ENCOUNTER — Other Ambulatory Visit: Payer: Self-pay | Admitting: *Deleted

## 2019-03-05 DIAGNOSIS — E7801 Familial hypercholesterolemia: Secondary | ICD-10-CM

## 2019-03-05 DIAGNOSIS — E559 Vitamin D deficiency, unspecified: Secondary | ICD-10-CM

## 2019-03-05 DIAGNOSIS — R739 Hyperglycemia, unspecified: Secondary | ICD-10-CM

## 2019-03-05 DIAGNOSIS — M810 Age-related osteoporosis without current pathological fracture: Secondary | ICD-10-CM

## 2019-03-05 MED ORDER — TOREMIFENE CITRATE 60 MG PO TABS: 60.0000 mg | ORAL_TABLET | Freq: Every day | ORAL | 3 refills | Status: DC

## 2019-03-10 ENCOUNTER — Telehealth: Payer: Self-pay | Admitting: *Deleted

## 2019-03-10 NOTE — Telephone Encounter (Signed)
LM for patient to call to schedule NP appt to Re-establish with Dr. Birdie Riddle.

## 2019-03-11 ENCOUNTER — Ambulatory Visit (INDEPENDENT_AMBULATORY_CARE_PROVIDER_SITE_OTHER): Payer: 59 | Admitting: Psychology

## 2019-03-11 DIAGNOSIS — F332 Major depressive disorder, recurrent severe without psychotic features: Secondary | ICD-10-CM | POA: Diagnosis not present

## 2019-03-11 DIAGNOSIS — F411 Generalized anxiety disorder: Secondary | ICD-10-CM | POA: Diagnosis not present

## 2019-03-18 ENCOUNTER — Ambulatory Visit (INDEPENDENT_AMBULATORY_CARE_PROVIDER_SITE_OTHER): Payer: 59 | Admitting: Psychology

## 2019-03-18 DIAGNOSIS — F332 Major depressive disorder, recurrent severe without psychotic features: Secondary | ICD-10-CM | POA: Diagnosis not present

## 2019-03-18 DIAGNOSIS — F411 Generalized anxiety disorder: Secondary | ICD-10-CM | POA: Diagnosis not present

## 2019-03-25 ENCOUNTER — Ambulatory Visit (INDEPENDENT_AMBULATORY_CARE_PROVIDER_SITE_OTHER): Payer: 59 | Admitting: Psychology

## 2019-03-25 DIAGNOSIS — F332 Major depressive disorder, recurrent severe without psychotic features: Secondary | ICD-10-CM

## 2019-03-25 DIAGNOSIS — F411 Generalized anxiety disorder: Secondary | ICD-10-CM

## 2019-04-01 ENCOUNTER — Ambulatory Visit: Payer: 59 | Admitting: Psychology

## 2019-04-08 ENCOUNTER — Ambulatory Visit (INDEPENDENT_AMBULATORY_CARE_PROVIDER_SITE_OTHER): Payer: 59 | Admitting: Psychology

## 2019-04-08 DIAGNOSIS — F411 Generalized anxiety disorder: Secondary | ICD-10-CM | POA: Diagnosis not present

## 2019-04-08 DIAGNOSIS — F332 Major depressive disorder, recurrent severe without psychotic features: Secondary | ICD-10-CM

## 2019-04-15 ENCOUNTER — Ambulatory Visit: Payer: 59 | Admitting: Psychology

## 2019-04-22 ENCOUNTER — Ambulatory Visit: Payer: 59 | Admitting: Psychology

## 2019-04-29 ENCOUNTER — Ambulatory Visit (INDEPENDENT_AMBULATORY_CARE_PROVIDER_SITE_OTHER): Payer: 59 | Admitting: Psychology

## 2019-04-29 DIAGNOSIS — F411 Generalized anxiety disorder: Secondary | ICD-10-CM

## 2019-04-29 DIAGNOSIS — F332 Major depressive disorder, recurrent severe without psychotic features: Secondary | ICD-10-CM

## 2019-05-06 ENCOUNTER — Ambulatory Visit (INDEPENDENT_AMBULATORY_CARE_PROVIDER_SITE_OTHER): Payer: 59 | Admitting: Psychology

## 2019-05-06 DIAGNOSIS — F411 Generalized anxiety disorder: Secondary | ICD-10-CM

## 2019-05-06 DIAGNOSIS — F332 Major depressive disorder, recurrent severe without psychotic features: Secondary | ICD-10-CM

## 2019-05-13 ENCOUNTER — Ambulatory Visit: Payer: 59 | Admitting: Psychology

## 2019-05-20 ENCOUNTER — Ambulatory Visit: Payer: 59 | Admitting: Psychology

## 2019-05-20 LAB — HM MAMMOGRAPHY: HM Mammogram: NORMAL

## 2019-05-24 ENCOUNTER — Ambulatory Visit (INDEPENDENT_AMBULATORY_CARE_PROVIDER_SITE_OTHER): Payer: 59 | Admitting: Psychology

## 2019-05-24 DIAGNOSIS — F332 Major depressive disorder, recurrent severe without psychotic features: Secondary | ICD-10-CM | POA: Diagnosis not present

## 2019-05-24 DIAGNOSIS — F411 Generalized anxiety disorder: Secondary | ICD-10-CM | POA: Diagnosis not present

## 2019-05-27 ENCOUNTER — Ambulatory Visit: Payer: 59 | Admitting: Psychology

## 2019-06-03 ENCOUNTER — Ambulatory Visit (INDEPENDENT_AMBULATORY_CARE_PROVIDER_SITE_OTHER): Payer: 59 | Admitting: Psychology

## 2019-06-03 DIAGNOSIS — F411 Generalized anxiety disorder: Secondary | ICD-10-CM | POA: Diagnosis not present

## 2019-06-03 DIAGNOSIS — F332 Major depressive disorder, recurrent severe without psychotic features: Secondary | ICD-10-CM

## 2019-06-04 ENCOUNTER — Inpatient Hospital Stay: Payer: 59 | Attending: Family

## 2019-06-04 ENCOUNTER — Inpatient Hospital Stay (HOSPITAL_BASED_OUTPATIENT_CLINIC_OR_DEPARTMENT_OTHER): Payer: 59 | Admitting: Family

## 2019-06-04 ENCOUNTER — Other Ambulatory Visit: Payer: Self-pay

## 2019-06-04 VITALS — BP 119/82 | HR 80 | Temp 97.7°F | Resp 19 | Wt 150.8 lb

## 2019-06-04 DIAGNOSIS — D509 Iron deficiency anemia, unspecified: Secondary | ICD-10-CM

## 2019-06-04 DIAGNOSIS — R5383 Other fatigue: Secondary | ICD-10-CM | POA: Insufficient documentation

## 2019-06-04 DIAGNOSIS — Z923 Personal history of irradiation: Secondary | ICD-10-CM | POA: Insufficient documentation

## 2019-06-04 DIAGNOSIS — D0512 Intraductal carcinoma in situ of left breast: Secondary | ICD-10-CM

## 2019-06-04 DIAGNOSIS — E559 Vitamin D deficiency, unspecified: Secondary | ICD-10-CM

## 2019-06-04 DIAGNOSIS — R42 Dizziness and giddiness: Secondary | ICD-10-CM | POA: Diagnosis not present

## 2019-06-04 LAB — CBC WITH DIFFERENTIAL (CANCER CENTER ONLY)
Abs Immature Granulocytes: 0.01 10*3/uL (ref 0.00–0.07)
Basophils Absolute: 0 10*3/uL (ref 0.0–0.1)
Basophils Relative: 1 %
Eosinophils Absolute: 0.1 10*3/uL (ref 0.0–0.5)
Eosinophils Relative: 3 %
HCT: 43.2 % (ref 36.0–46.0)
Hemoglobin: 14.1 g/dL (ref 12.0–15.0)
Immature Granulocytes: 0 %
Lymphocytes Relative: 30 %
Lymphs Abs: 1.3 10*3/uL (ref 0.7–4.0)
MCH: 28 pg (ref 26.0–34.0)
MCHC: 32.6 g/dL (ref 30.0–36.0)
MCV: 85.9 fL (ref 80.0–100.0)
Monocytes Absolute: 0.4 10*3/uL (ref 0.1–1.0)
Monocytes Relative: 10 %
Neutro Abs: 2.4 10*3/uL (ref 1.7–7.7)
Neutrophils Relative %: 56 %
Platelet Count: 203 10*3/uL (ref 150–400)
RBC: 5.03 MIL/uL (ref 3.87–5.11)
RDW: 13.6 % (ref 11.5–15.5)
WBC Count: 4.4 10*3/uL (ref 4.0–10.5)
nRBC: 0 % (ref 0.0–0.2)

## 2019-06-04 LAB — FERRITIN: Ferritin: 59 ng/mL (ref 11–307)

## 2019-06-04 LAB — CMP (CANCER CENTER ONLY)
ALT: 11 U/L (ref 0–44)
AST: 17 U/L (ref 15–41)
Albumin: 4.1 g/dL (ref 3.5–5.0)
Alkaline Phosphatase: 60 U/L (ref 38–126)
Anion gap: 5 (ref 5–15)
BUN: 16 mg/dL (ref 6–20)
CO2: 33 mmol/L — ABNORMAL HIGH (ref 22–32)
Calcium: 9.8 mg/dL (ref 8.9–10.3)
Chloride: 101 mmol/L (ref 98–111)
Creatinine: 0.7 mg/dL (ref 0.44–1.00)
GFR, Est AFR Am: >60 mL/min (ref >60)
GFR, Estimated: >60 mL/min (ref >60)
Glucose, Bld: 114 mg/dL — ABNORMAL HIGH (ref 70–99)
Potassium: 4.2 mmol/L (ref 3.5–5.1)
Sodium: 139 mmol/L (ref 135–145)
Total Bilirubin: 0.6 mg/dL (ref 0.3–1.2)
Total Protein: 6.5 g/dL (ref 6.5–8.1)

## 2019-06-04 LAB — VITAMIN D 25 HYDROXY (VIT D DEFICIENCY, FRACTURES): Vit D, 25-Hydroxy: 35.93 ng/mL (ref 30–100)

## 2019-06-04 LAB — IRON AND TIBC
Iron: 100 ug/dL (ref 41–142)
Saturation Ratios: 27 % (ref 21–57)
TIBC: 375 ug/dL (ref 236–444)
UIBC: 275 ug/dL (ref 120–384)

## 2019-06-04 NOTE — Progress Notes (Signed)
Hematology and Oncology Follow Up Visit  Pamela Ray [id number] 1965-12-10 53 y.o. 06/04/2019   Principle Diagnosis:  DCIS of the left breast Recurrent iron deficiency anemia  Current Therapy:   IV iron as indicated - Injectafer (reacted to Feraheme) Fareston 60mg  po q day   Interim History:  Pamela Ray is here today for follow-up. She has had a rough few months. Unfortunately her mother passed away 4 weeks ago.  She is symptomatic with fatigue and has been having random episodes of dizziness where the room spins. She has an appointment to reestablish care with her PCP next month and plans to discuss this with her then.  She verbalized that she is taking her Fareston daily as prescribed.  She is still having her cycle which remains irregular.  Mammogram and right breast US with [facility] 3 weeks ago were negative.  She has had no issue with infections. No fever, chills, n/v, cough, rash, SOB, chest pain, abdominal pain or changes in bowel or bladder habits.  No swelling, tenderness, numbness or tingling in her extremities.  She has maintained a good appetite and is staying hydrated. Her weight is stable.   ECOG Performance Status: 1 - Symptomatic but completely ambulatory  Medications:  Allergies as of 06/04/2019      Reactions   Amoxicillin Itching   Cephalexin Itching   Penicillins Hives      Medication List       Accurate as of June 04, 2019  9:34 AM. If you have any questions, ask your nurse or doctor.        ALPRAZolam 0.25 MG tablet Commonly known as: XANAX Take 1 tablet (0.25 mg total) by mouth 3 (three) times daily as needed. for anxiety   Botox 100 units Solr injection Generic drug: botulinum toxin Type A Every 3 months   ergocalciferol 1.25 MG (50000 UT) capsule Commonly known as: VITAMIN D2 Take 1 capsule (50,000 Units total) by mouth 2 (two) times a week.   hydrocortisone-pramoxine 2.5-1 % rectal cream Commonly known as: ANALPRAM-HC   ibuprofen 200  MG tablet Commonly known as: ADVIL Take 200 mg by mouth every 6 (six) hours as needed.   SUMAtriptan 100 MG tablet Commonly known as: IMITREX Take 100 mg by mouth every 2 (two) hours as needed. Take 1 tablet at onset of migraine  may repeat in 2 hours  Limit use to 2 days a week   Toremifene Citrate 60 MG tablet Commonly known as: Fareston Take 1 tablet (60 mg total) by mouth daily.       Allergies:  Allergies  Allergen Reactions  . Amoxicillin Itching  . Cephalexin Itching  . Penicillins Hives    Past Medical History, Surgical history, Social history, and Family History were reviewed and updated.  Review of Systems: All other 10 point review of systems is negative.   Physical Exam:  weight is 150 lb 12.8 oz (68.4 kg). Her temporal temperature is 97.7 F (36.5 C). Her blood pressure is 119/82 and her pulse is 80. Her respiration is 19 and oxygen saturation is 100%.   Wt Readings from Last 3 Encounters:  06/04/19 150 lb 12.8 oz (68.4 kg)  02/18/19 148 lb 12.8 oz (67.5 kg)  05/22/18 145 lb (65.8 kg)    Ocular: Sclerae unicteric, pupils equal, round and reactive to light Ear-nose-throat: Oropharynx clear, dentition fair Lymphatic: No cervical or supraclavicular adenopathy Lungs no rales or rhonchi, good excursion bilaterally Heart regular rate and rhythm, no murmur appreciated Abd soft,  nontender, positive bowel sounds, no liver or spleen tip palpated on exam, no fluid wave  MSK no focal spinal tenderness, no joint edema Neuro: non-focal, well-oriented, appropriate affect Breasts: No changes, no mass, lesion or rash. Mammogram completed 3 weeks ago with [place] negative  Lab Results  Component Value Date   WBC 4.4 06/04/2019   HGB 14.1 06/04/2019   HCT 43.2 06/04/2019   MCV 85.9 06/04/2019   PLT 203 06/04/2019   Lab Results  Component Value Date   FERRITIN 87 02/18/2019   IRON 81 02/18/2019   TIBC 363 02/18/2019   UIBC 282 02/18/2019   IRONPCTSAT 22 02/18/2019    Lab Results  Component Value Date   RETICCTPCT 1.0 05/12/2015   RBC 5.03 06/04/2019   RETICCTABS 48.9 05/12/2015   No results found for: KPAFRELGTCHN, LAMBDASER, KAPLAMBRATIO No results found for: Kandis Cocking, IGMSERUM No results found for: Odetta Pink, SPEI   Chemistry      Component Value Date/Time   NA 139 06/04/2019 0854   NA 141 07/25/2017 1131   NA 140 12/04/2016 0747   K 4.2 06/04/2019 0854   K 3.9 07/25/2017 1131   K 4.2 12/04/2016 0747   CL 101 06/04/2019 0854   CL 108 07/25/2017 1131   CO2 33 (H) 06/04/2019 0854   CO2 27 07/25/2017 1131   CO2 25 12/04/2016 0747   BUN 16 06/04/2019 0854   BUN 12 07/25/2017 1131   BUN 13.2 12/04/2016 0747   CREATININE 0.70 06/04/2019 0854   CREATININE 0.7 07/25/2017 1131   CREATININE 0.7 12/04/2016 0747      Component Value Date/Time   CALCIUM 9.8 06/04/2019 0854   CALCIUM 9.4 07/25/2017 1131   CALCIUM 9.3 12/04/2016 0747   ALKPHOS 60 06/04/2019 0854   ALKPHOS 65 07/25/2017 1131   ALKPHOS 64 12/04/2016 0747   AST 17 06/04/2019 0854   AST 17 12/04/2016 0747   ALT 11 06/04/2019 0854   ALT 20 07/25/2017 1131   ALT 10 12/04/2016 0747   BILITOT 0.6 06/04/2019 0854   BILITOT 0.73 12/04/2016 0747       Impression and Plan: Pamela Ray is a very pleasant53 yo caucasian female with 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 She had a lumpectomy followed by radiation and is now on Fareston. She will complete a full 10 years of Fareston (2024).  She continues to do well and there has been no evidence of recurrence.  We will see what her iron studies and vitamin D level look like and replace if needed.  We will go ahead and plan to see her back in another 6 months.  She will contact our office with any questions or concerns. We can certainly see her sooner if needed.   Laverna Peace, NP 10/16/20209:34 AM

## 2019-06-07 ENCOUNTER — Other Ambulatory Visit: Payer: Self-pay | Admitting: *Deleted

## 2019-06-07 ENCOUNTER — Telehealth: Payer: Self-pay | Admitting: *Deleted

## 2019-06-07 ENCOUNTER — Other Ambulatory Visit: Payer: Self-pay | Admitting: Family

## 2019-06-07 DIAGNOSIS — D508 Other iron deficiency anemias: Secondary | ICD-10-CM

## 2019-06-07 DIAGNOSIS — R739 Hyperglycemia, unspecified: Secondary | ICD-10-CM

## 2019-06-07 DIAGNOSIS — D0512 Intraductal carcinoma in situ of left breast: Secondary | ICD-10-CM

## 2019-06-07 DIAGNOSIS — M810 Age-related osteoporosis without current pathological fracture: Secondary | ICD-10-CM

## 2019-06-07 DIAGNOSIS — E7801 Familial hypercholesterolemia: Secondary | ICD-10-CM

## 2019-06-07 DIAGNOSIS — F064 Anxiety disorder due to known physiological condition: Secondary | ICD-10-CM

## 2019-06-07 DIAGNOSIS — E559 Vitamin D deficiency, unspecified: Secondary | ICD-10-CM

## 2019-06-07 DIAGNOSIS — Z8249 Family history of ischemic heart disease and other diseases of the circulatory system: Secondary | ICD-10-CM

## 2019-06-07 DIAGNOSIS — E78019 Familial hypercholesterolemia, unspecified: Secondary | ICD-10-CM

## 2019-06-07 DIAGNOSIS — M818 Other osteoporosis without current pathological fracture: Secondary | ICD-10-CM

## 2019-06-07 DIAGNOSIS — D509 Iron deficiency anemia, unspecified: Secondary | ICD-10-CM

## 2019-06-07 DIAGNOSIS — T386X5A Adverse effect of antigonadotrophins, antiestrogens, antiandrogens, not elsewhere classified, initial encounter: Secondary | ICD-10-CM

## 2019-06-07 DIAGNOSIS — R002 Palpitations: Secondary | ICD-10-CM

## 2019-06-07 MED ORDER — ERGOCALCIFEROL 1.25 MG (50000 UT) PO CAPS: 50000.0000 [IU] | ORAL_CAPSULE | ORAL | 5 refills | Status: DC

## 2019-06-07 MED ORDER — TOREMIFENE CITRATE 60 MG PO TABS: 60.0000 mg | ORAL_TABLET | Freq: Every day | ORAL | 3 refills | Status: DC

## 2019-06-07 MED ORDER — ALPRAZOLAM 0.25 MG PO TABS: 0.2500 mg | ORAL_TABLET | Freq: Three times a day (TID) | ORAL | 1 refills | Status: DC | PRN

## 2019-06-07 MED FILL — VIT D2 1.25 MG (50,000 UNIT: 1.25 MG | 28 days supply | Qty: 4 | Fill #0

## 2019-06-07 NOTE — Telephone Encounter (Signed)
Call placed to patient to notify her per order of S. Fairbanks NP that refill for weekly Vitamin D has been sent in to her pharmacy.  Pt requesting refill of Xanax and Fareston.  OK to refill Xanax and Fareston per S. Garber NP.

## 2019-06-10 ENCOUNTER — Ambulatory Visit: Payer: 59 | Admitting: Psychology

## 2019-06-17 ENCOUNTER — Ambulatory Visit: Payer: 59 | Admitting: Psychology

## 2019-06-24 ENCOUNTER — Ambulatory Visit (INDEPENDENT_AMBULATORY_CARE_PROVIDER_SITE_OTHER): Payer: 59 | Admitting: Psychology

## 2019-06-24 DIAGNOSIS — F332 Major depressive disorder, recurrent severe without psychotic features: Secondary | ICD-10-CM

## 2019-06-24 DIAGNOSIS — F411 Generalized anxiety disorder: Secondary | ICD-10-CM

## 2019-06-29 MED FILL — VIT D2 1.25 MG (50,000 UNIT: 1.25 MG | 28 days supply | Qty: 4 | Fill #0

## 2019-07-01 ENCOUNTER — Ambulatory Visit: Payer: 59 | Admitting: Psychology

## 2019-07-08 ENCOUNTER — Ambulatory Visit: Payer: 59 | Admitting: Psychology

## 2019-07-09 ENCOUNTER — Ambulatory Visit (INDEPENDENT_AMBULATORY_CARE_PROVIDER_SITE_OTHER): Payer: 59 | Admitting: Family Medicine

## 2019-07-09 ENCOUNTER — Other Ambulatory Visit: Payer: Self-pay

## 2019-07-09 ENCOUNTER — Encounter: Payer: Self-pay | Admitting: Family Medicine

## 2019-07-09 VITALS — HR 80

## 2019-07-09 DIAGNOSIS — D0512 Intraductal carcinoma in situ of left breast: Secondary | ICD-10-CM | POA: Diagnosis not present

## 2019-07-09 DIAGNOSIS — F4323 Adjustment disorder with mixed anxiety and depressed mood: Secondary | ICD-10-CM

## 2019-07-09 DIAGNOSIS — G43909 Migraine, unspecified, not intractable, without status migrainosus: Secondary | ICD-10-CM | POA: Diagnosis not present

## 2019-07-09 NOTE — Progress Notes (Signed)
Virtual Visit via Video   I connected with patient on 07/09/19 at  2:00 PM EST by a video enabled telemedicine application and verified that I am speaking with the correct person using two identifiers.  Location patient: Home Location provider: Acupuncturist, Office Persons participating in the virtual visit: Patient, Provider, [place] (Jess B)  I discussed the limitations of evaluation and management by telemedicine and the availability of in person appointments. The patient expressed understanding and agreed to proceed.  Subjective:   HPI:   Pt to re-establish.    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  Plan is for Fareston x10 yrs (has completed 6).  Following w/ Dr Marin Olp.  Migraines- following w/ Dr Domingo Cocking at [facility].  Gets Botox regularly to prevent HAs.  Will use Imitrex as needed.  Adjustment disorder- mom recently passed.  Sees Dr Cheryln Manly weekly.  Feels that mood is currently well managed.  Uses Alprazolam prn.  Has not used recently.  ROS:   See pertinent positives and negatives per HPI.  Patient Active Problem List   Diagnosis Date Noted  . Other malaise and fatigue 02/24/2013  . Shortness of breath 02/24/2013  . 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 Cervical lymphadenitis 10/08/2012  . Migraine variant 05/14/2012  . Syncope 05/14/2012  . Palpitations 05/14/2012  . Anemia, iron deficiency 10/11/2011  . Migraine 10/11/2011  . Raynaud's phenomenon (by history or observed) 10/11/2011  . General medical examination 09/05/2011    Social History   Tobacco Use  . Smoking status: Never Smoker  . Smokeless tobacco: Never Used  . Tobacco comment: never used tobacco  Substance Use Topics  . Alcohol use: No    Alcohol/week: 0.0 standard drinks    Current Outpatient Medications:  .  ALPRAZolam (XANAX) 0.25 MG tablet, Take 1 tablet (0.25 mg total) by mouth 3 (three) times daily as needed. for anxiety, Disp: 20 tablet, Rfl: 1  .  botulinum toxin Type A (BOTOX) 100 units SOLR injection, Inject as directed., Disp: , Rfl:  .  ergocalciferol (VITAMIN D2) 1.25 MG (50000 UT) capsule, Take 1 capsule (50,000 Units total) by mouth once a week., Disp: 8 capsule, Rfl: 5 .  hydrocortisone 2.5 % ointment, , Disp: , Rfl:  .  ibuprofen (ADVIL,MOTRIN) 200 MG tablet, Take 200 mg by mouth every 6 (six) hours as needed., Disp: , Rfl:  .  SUMAtriptan (IMITREX) 100 MG tablet, Take by mouth., Disp: , Rfl:  .  Toremifene Citrate (FARESTON) 60 MG tablet, Take by mouth., Disp: , Rfl:  .  VITAMIN D PO, Take by mouth., Disp: , Rfl:   Allergies  Allergen Reactions  . Amoxicillin Itching  . Cephalexin Itching  . Penicillins Hives    Objective:   Pulse 80  AAOx3, NAD NCAT, EOMI No obvious CN deficits Coloring WNL Pt is able to speak clearly, coherently without shortness of breath or increased work of breathing.  Thought process is linear.  Mood is appropriate.   Assessment and Plan:   DCIS- new to provider, ongoing for pt.  Dx'd 6 yrs ago and treated w/ surgery and radiation.  Now on 10 yr course of Allison Quarry (has completed 6).  Following w/ Dr Marin Olp and overall doing very well.  Will follow along.  Migraines- new to provider, ongoing for pt.  Following w/ Dr Domingo Cocking and getting regular Botox injxns for preventative care.  Will use Imitrex prn.  Will follow along.  Adjustment Disorder- new.  Mother  passed this year after a 7 week hospitalization (6 of which pt could not be with her due to [place]).  She is seeing Dr Cheryln Manly weekly and feels that she is doing well.  Has Alprazolam to use prn.  Will follow and assist as able.   Annye Asa, MD 07/09/2019

## 2019-07-09 NOTE — Progress Notes (Signed)
I have discussed the procedure for the virtual visit with the patient who has given consent to proceed with assessment and treatment.   Pt unable to obtain vitals.   Orra Nolde L Leandro Berkowitz, CMA     

## 2019-07-21 ENCOUNTER — Telehealth: Payer: Self-pay | Admitting: Family Medicine

## 2019-07-21 NOTE — Telephone Encounter (Signed)
Lm for pt to ball back and schedule a cpe in 69m

## 2019-07-22 ENCOUNTER — Ambulatory Visit (INDEPENDENT_AMBULATORY_CARE_PROVIDER_SITE_OTHER): Payer: 59 | Admitting: Psychology

## 2019-07-22 DIAGNOSIS — F332 Major depressive disorder, recurrent severe without psychotic features: Secondary | ICD-10-CM

## 2019-07-22 DIAGNOSIS — F411 Generalized anxiety disorder: Secondary | ICD-10-CM

## 2019-07-29 ENCOUNTER — Ambulatory Visit (INDEPENDENT_AMBULATORY_CARE_PROVIDER_SITE_OTHER): Payer: 59 | Admitting: Psychology

## 2019-07-29 DIAGNOSIS — F332 Major depressive disorder, recurrent severe without psychotic features: Secondary | ICD-10-CM | POA: Diagnosis not present

## 2019-07-29 DIAGNOSIS — F411 Generalized anxiety disorder: Secondary | ICD-10-CM | POA: Diagnosis not present

## 2019-08-05 ENCOUNTER — Ambulatory Visit (INDEPENDENT_AMBULATORY_CARE_PROVIDER_SITE_OTHER): Payer: 59 | Admitting: Psychology

## 2019-08-05 DIAGNOSIS — F332 Major depressive disorder, recurrent severe without psychotic features: Secondary | ICD-10-CM | POA: Diagnosis not present

## 2019-08-05 DIAGNOSIS — F411 Generalized anxiety disorder: Secondary | ICD-10-CM

## 2019-08-11 ENCOUNTER — Ambulatory Visit (INDEPENDENT_AMBULATORY_CARE_PROVIDER_SITE_OTHER): Payer: 59 | Admitting: Psychology

## 2019-08-11 DIAGNOSIS — F332 Major depressive disorder, recurrent severe without psychotic features: Secondary | ICD-10-CM

## 2019-08-11 DIAGNOSIS — F411 Generalized anxiety disorder: Secondary | ICD-10-CM

## 2019-08-12 ENCOUNTER — Ambulatory Visit: Payer: 59 | Admitting: Psychology

## 2019-08-17 ENCOUNTER — Ambulatory Visit (INDEPENDENT_AMBULATORY_CARE_PROVIDER_SITE_OTHER): Payer: 59 | Admitting: Psychology

## 2019-08-17 DIAGNOSIS — F411 Generalized anxiety disorder: Secondary | ICD-10-CM

## 2019-08-17 DIAGNOSIS — F332 Major depressive disorder, recurrent severe without psychotic features: Secondary | ICD-10-CM | POA: Diagnosis not present

## 2019-08-19 ENCOUNTER — Ambulatory Visit: Payer: 59 | Admitting: Psychology

## 2019-08-26 ENCOUNTER — Ambulatory Visit (INDEPENDENT_AMBULATORY_CARE_PROVIDER_SITE_OTHER): Payer: 59 | Admitting: Psychology

## 2019-08-26 DIAGNOSIS — F411 Generalized anxiety disorder: Secondary | ICD-10-CM | POA: Diagnosis not present

## 2019-08-26 DIAGNOSIS — F332 Major depressive disorder, recurrent severe without psychotic features: Secondary | ICD-10-CM | POA: Diagnosis not present

## 2019-09-02 ENCOUNTER — Ambulatory Visit (INDEPENDENT_AMBULATORY_CARE_PROVIDER_SITE_OTHER): Payer: 59 | Admitting: Psychology

## 2019-09-02 DIAGNOSIS — F411 Generalized anxiety disorder: Secondary | ICD-10-CM | POA: Diagnosis not present

## 2019-09-02 DIAGNOSIS — F332 Major depressive disorder, recurrent severe without psychotic features: Secondary | ICD-10-CM | POA: Diagnosis not present

## 2019-09-09 ENCOUNTER — Ambulatory Visit (INDEPENDENT_AMBULATORY_CARE_PROVIDER_SITE_OTHER): Payer: 59 | Admitting: Psychology

## 2019-09-09 DIAGNOSIS — F411 Generalized anxiety disorder: Secondary | ICD-10-CM

## 2019-09-09 DIAGNOSIS — F332 Major depressive disorder, recurrent severe without psychotic features: Secondary | ICD-10-CM | POA: Diagnosis not present

## 2019-09-16 ENCOUNTER — Ambulatory Visit (INDEPENDENT_AMBULATORY_CARE_PROVIDER_SITE_OTHER): Payer: 59 | Admitting: Psychology

## 2019-09-16 DIAGNOSIS — F411 Generalized anxiety disorder: Secondary | ICD-10-CM | POA: Diagnosis not present

## 2019-09-16 DIAGNOSIS — F332 Major depressive disorder, recurrent severe without psychotic features: Secondary | ICD-10-CM

## 2019-09-23 ENCOUNTER — Ambulatory Visit: Payer: 59 | Admitting: Psychology

## 2019-09-30 ENCOUNTER — Ambulatory Visit (INDEPENDENT_AMBULATORY_CARE_PROVIDER_SITE_OTHER): Payer: 59 | Admitting: Psychology

## 2019-09-30 DIAGNOSIS — F411 Generalized anxiety disorder: Secondary | ICD-10-CM | POA: Diagnosis not present

## 2019-09-30 DIAGNOSIS — F332 Major depressive disorder, recurrent severe without psychotic features: Secondary | ICD-10-CM

## 2019-10-01 MED FILL — VIT D2 1.25 MG (50,000 UNIT: 1.25 MG | 28 days supply | Qty: 4 | Fill #1

## 2019-10-07 ENCOUNTER — Ambulatory Visit: Payer: 59 | Admitting: Psychology

## 2019-10-14 ENCOUNTER — Ambulatory Visit: Payer: 59 | Attending: Family

## 2019-10-14 ENCOUNTER — Ambulatory Visit (INDEPENDENT_AMBULATORY_CARE_PROVIDER_SITE_OTHER): Payer: 59 | Admitting: Psychology

## 2019-10-14 DIAGNOSIS — F411 Generalized anxiety disorder: Secondary | ICD-10-CM

## 2019-10-14 DIAGNOSIS — F332 Major depressive disorder, recurrent severe without psychotic features: Secondary | ICD-10-CM | POA: Diagnosis not present

## 2019-10-15 MED FILL — VIT D2 1.25 MG (50,000 UNIT: 1.25 MG | 28 days supply | Qty: 4 | Fill #1

## 2019-10-21 ENCOUNTER — Ambulatory Visit: Payer: 59 | Admitting: Psychology

## 2019-10-28 ENCOUNTER — Ambulatory Visit: Payer: 59 | Admitting: Psychology

## 2019-11-04 ENCOUNTER — Ambulatory Visit: Payer: 59 | Admitting: Psychology

## 2019-11-11 ENCOUNTER — Ambulatory Visit: Payer: 59 | Admitting: Psychology

## 2019-11-16 ENCOUNTER — Ambulatory Visit: Payer: Self-pay

## 2019-11-18 ENCOUNTER — Ambulatory Visit: Payer: 59 | Admitting: Psychology

## 2019-11-25 ENCOUNTER — Ambulatory Visit (INDEPENDENT_AMBULATORY_CARE_PROVIDER_SITE_OTHER): Payer: 59 | Admitting: Psychology

## 2019-11-25 DIAGNOSIS — F332 Major depressive disorder, recurrent severe without psychotic features: Secondary | ICD-10-CM | POA: Diagnosis not present

## 2019-11-25 DIAGNOSIS — F411 Generalized anxiety disorder: Secondary | ICD-10-CM | POA: Diagnosis not present

## 2019-12-02 ENCOUNTER — Ambulatory Visit: Payer: 59 | Admitting: Psychology

## 2019-12-03 ENCOUNTER — Ambulatory Visit: Payer: 59 | Admitting: Family

## 2019-12-03 ENCOUNTER — Other Ambulatory Visit: Payer: 59

## 2019-12-06 MED FILL — VIT D2 1.25 MG (50,000 UNIT: 1.25 MG | 28 days supply | Qty: 4 | Fill #2

## 2019-12-08 ENCOUNTER — Inpatient Hospital Stay (HOSPITAL_BASED_OUTPATIENT_CLINIC_OR_DEPARTMENT_OTHER): Payer: 59 | Admitting: Family

## 2019-12-08 ENCOUNTER — Inpatient Hospital Stay: Payer: 59 | Attending: Family

## 2019-12-08 ENCOUNTER — Encounter: Payer: Self-pay | Admitting: Family

## 2019-12-08 ENCOUNTER — Other Ambulatory Visit: Payer: Self-pay

## 2019-12-08 DIAGNOSIS — R109 Unspecified abdominal pain: Secondary | ICD-10-CM | POA: Diagnosis not present

## 2019-12-08 DIAGNOSIS — M810 Age-related osteoporosis without current pathological fracture: Secondary | ICD-10-CM

## 2019-12-08 DIAGNOSIS — D0512 Intraductal carcinoma in situ of left breast: Secondary | ICD-10-CM | POA: Insufficient documentation

## 2019-12-08 DIAGNOSIS — E7801 Familial hypercholesterolemia: Secondary | ICD-10-CM | POA: Diagnosis not present

## 2019-12-08 DIAGNOSIS — R739 Hyperglycemia, unspecified: Secondary | ICD-10-CM

## 2019-12-08 DIAGNOSIS — D508 Other iron deficiency anemias: Secondary | ICD-10-CM

## 2019-12-08 DIAGNOSIS — R5383 Other fatigue: Secondary | ICD-10-CM | POA: Diagnosis not present

## 2019-12-08 DIAGNOSIS — E559 Vitamin D deficiency, unspecified: Secondary | ICD-10-CM

## 2019-12-08 DIAGNOSIS — E78019 Familial hypercholesterolemia, unspecified: Secondary | ICD-10-CM

## 2019-12-08 DIAGNOSIS — Z8249 Family history of ischemic heart disease and other diseases of the circulatory system: Secondary | ICD-10-CM

## 2019-12-08 DIAGNOSIS — N951 Menopausal and female climacteric states: Secondary | ICD-10-CM | POA: Insufficient documentation

## 2019-12-08 DIAGNOSIS — D509 Iron deficiency anemia, unspecified: Secondary | ICD-10-CM

## 2019-12-08 DIAGNOSIS — M818 Other osteoporosis without current pathological fracture: Secondary | ICD-10-CM

## 2019-12-08 DIAGNOSIS — R002 Palpitations: Secondary | ICD-10-CM

## 2019-12-08 DIAGNOSIS — F064 Anxiety disorder due to known physiological condition: Secondary | ICD-10-CM

## 2019-12-08 DIAGNOSIS — T386X5A Adverse effect of antigonadotrophins, antiestrogens, antiandrogens, not elsewhere classified, initial encounter: Secondary | ICD-10-CM

## 2019-12-08 LAB — IRON AND TIBC
Iron: 61 ug/dL (ref 41–142)
Saturation Ratios: 16 % — ABNORMAL LOW (ref 21–57)
TIBC: 380 ug/dL (ref 236–444)
UIBC: 320 ug/dL (ref 120–384)

## 2019-12-08 LAB — CBC WITH DIFFERENTIAL (CANCER CENTER ONLY)
Abs Immature Granulocytes: 0.01 10*3/uL (ref 0.00–0.07)
Basophils Absolute: 0 10*3/uL (ref 0.0–0.1)
Basophils Relative: 1 %
Eosinophils Absolute: 0.1 10*3/uL (ref 0.0–0.5)
Eosinophils Relative: 4 %
HCT: 38.4 % (ref 36.0–46.0)
Hemoglobin: 12.9 g/dL (ref 12.0–15.0)
Immature Granulocytes: 0 %
Lymphocytes Relative: 43 %
Lymphs Abs: 1.5 10*3/uL (ref 0.7–4.0)
MCH: 28.2 pg (ref 26.0–34.0)
MCHC: 33.6 g/dL (ref 30.0–36.0)
MCV: 84 fL (ref 80.0–100.0)
Monocytes Absolute: 0.4 10*3/uL (ref 0.1–1.0)
Monocytes Relative: 12 %
Neutro Abs: 1.4 10*3/uL — ABNORMAL LOW (ref 1.7–7.7)
Neutrophils Relative %: 40 %
Platelet Count: 221 10*3/uL (ref 150–400)
RBC: 4.57 MIL/uL (ref 3.87–5.11)
RDW: 15.2 % (ref 11.5–15.5)
WBC Count: 3.6 10*3/uL — ABNORMAL LOW (ref 4.0–10.5)
nRBC: 0 % (ref 0.0–0.2)

## 2019-12-08 LAB — CMP (CANCER CENTER ONLY)
ALT: 10 U/L (ref 0–44)
AST: 17 U/L (ref 15–41)
Albumin: 3.9 g/dL (ref 3.5–5.0)
Alkaline Phosphatase: 63 U/L (ref 38–126)
Anion gap: 8 (ref 5–15)
BUN: 14 mg/dL (ref 6–20)
CO2: 23 mmol/L (ref 22–32)
Calcium: 9.2 mg/dL (ref 8.9–10.3)
Chloride: 108 mmol/L (ref 98–111)
Creatinine: 0.65 mg/dL (ref 0.44–1.00)
GFR, Est AFR Am: >60 mL/min (ref >60)
GFR, Estimated: >60 mL/min (ref >60)
Glucose, Bld: 135 mg/dL — ABNORMAL HIGH (ref 70–99)
Potassium: 4 mmol/L (ref 3.5–5.1)
Sodium: 139 mmol/L (ref 135–145)
Total Bilirubin: 0.5 mg/dL (ref 0.3–1.2)
Total Protein: 6.4 g/dL — ABNORMAL LOW (ref 6.5–8.1)

## 2019-12-08 LAB — FERRITIN: Ferritin: 12 ng/mL (ref 11–307)

## 2019-12-08 LAB — VITAMIN D 25 HYDROXY (VIT D DEFICIENCY, FRACTURES): Vit D, 25-Hydroxy: 50.42 ng/mL (ref 30–100)

## 2019-12-08 MED ORDER — TOREMIFENE CITRATE 60 MG PO TABS: 60.0000 mg | ORAL_TABLET | Freq: Every day | ORAL | 8 refills | Status: DC

## 2019-12-08 MED ORDER — ERGOCALCIFEROL 1.25 MG (50000 UT) PO CAPS: 50000.0000 [IU] | ORAL_CAPSULE | ORAL | 5 refills | Status: DC

## 2019-12-08 NOTE — Progress Notes (Signed)
Hematology and Oncology Follow Up Visit  Pamela Ray [id number] Aug 01, 1966 54 y.o. 12/08/2019   Principle Diagnosis:  DCIS of the left breast Recurrent iron deficiency anemia  Current Therapy:        IV ironas indicated - Injectafer (reacted to Feraheme) Fareston 60mg  po q day   Interim History:  Pamela Ray is here today for follow-up. She is doing well.  She has some fatigue at times.  She has occasional hot flashes with Fareston but is tolerating nicely.  Bilateral breast exam today was negative.  No adenopathy noted on exam.  She has had some left back flank pain intermittently each day for the last month. She states that it feels like she has a kidney stone and has been avoiding caffeine and drinking lots of fluids. She will contact our PCP if this persists or worsens.  She is having her cycle every 4 months or so and she states that her flow is very heavy the first couple days.  She has not noted any other blood loss. No bruising or petechiae.  No fever, chills, n/v, cough, rash, dizziness, SOB, chest pain, palpitations, abdominal pain or changes in bowel or bladder habits.  No swelling or tenderness in her extremities.  She has occasional numbness and tingling in her hands. This comes and goes.  She has maintained a healthy appetite and is staying well hydrated. She is doing the [employer] food and exercise plan which has helped her lose 8 lbs over the last month.  She has had both Pfizer Covid vaccines.   ECOG Performance Status: 1 - Symptomatic but completely ambulatory  Medications:  Allergies as of 12/08/2019      Reactions   Amoxicillin Itching   Cephalexin Itching   Penicillins Hives      Medication List       Accurate as of December 08, 2019  8:50 AM. If you have any questions, ask your nurse or doctor.        ALPRAZolam 0.25 MG tablet Commonly known as: XANAX Take 1 tablet (0.25 mg total) by mouth 3 (three) times daily as needed. for anxiety   Boric Acid  Gran Insert 1 capsule in vagina every night x 14 days then every Monday and Thursday night indefinitely   Botulinum Toxin Type A (Cosm) 100 units Solr Every 3 months   botulinum toxin Type A 100 units Solr injection Commonly known as: BOTOX Inject as directed.   ergocalciferol 1.25 MG (50000 UT) capsule Commonly known as: VITAMIN D2 Take 1 capsule (50,000 Units total) by mouth once a week.   ergocalciferol 1.25 MG (50000 UT) capsule Commonly known as: VITAMIN D2 SMARTSIG:1 Capsule(s) By Mouth Once a Week   hydrocortisone 2.5 % ointment   Hydrocortisone Ace-Pramoxine 2.5-1 % Crea Place rectally.   ibuprofen 200 MG tablet Commonly known as: ADVIL Take 200 mg by mouth every 6 (six) hours as needed.   SUMAtriptan 100 MG tablet Commonly known as: IMITREX Take by mouth.   Toremifene Citrate 60 MG tablet Commonly known as: FARESTON Take by mouth.   VITAMIN D PO Take by mouth.       Allergies:  Allergies  Allergen Reactions  . Amoxicillin Itching  . Cephalexin Itching  . Penicillins Hives    Past Medical History, Surgical history, Social history, and Family History were reviewed and updated.  Review of Systems: All other 10 point review of systems is negative.   Physical Exam:  vitals were not taken for this visit.  Wt Readings from Last 3 Encounters:  06/04/19 150 lb 12.8 oz (68.4 kg)  02/18/19 148 lb 12.8 oz (67.5 kg)  05/22/18 145 lb (65.8 kg)    Ocular: Sclerae unicteric, pupils equal, round and reactive to light Ear-nose-throat: Oropharynx clear, dentition fair Lymphatic: No cervical, supraclavicular or axillary adenopathy Lungs no rales or rhonchi, good excursion bilaterally Heart regular rate and rhythm, no murmur appreciated Abd soft, nontender, positive bowel sounds, no liver or spleen tip palpated on exam, no fluid wave  MSK no focal spinal tenderness, no joint edema Neuro: non-focal, well-oriented, appropriate affect Breasts: No changes on  today's exam. No redness, edema, mass, lesion or rash noted.   Lab Results  Component Value Date   WBC 3.6 (L) 12/08/2019   HGB 12.9 12/08/2019   HCT 38.4 12/08/2019   MCV 84.0 12/08/2019   PLT 221 12/08/2019   Lab Results  Component Value Date   FERRITIN 59 06/04/2019   IRON 100 06/04/2019   TIBC 375 06/04/2019   UIBC 275 06/04/2019   IRONPCTSAT 27 06/04/2019   Lab Results  Component Value Date   RETICCTPCT 1.0 05/12/2015   RBC 4.57 12/08/2019   RETICCTABS 48.9 05/12/2015   No results found for: KPAFRELGTCHN, LAMBDASER, KAPLAMBRATIO No results found for: Kandis Cocking, IGMSERUM No results found for: Odetta Pink, SPEI   Chemistry      Component Value Date/Time   NA 139 06/04/2019 0854   NA 141 07/25/2017 1131   NA 140 12/04/2016 0747   K 4.2 06/04/2019 0854   K 3.9 07/25/2017 1131   K 4.2 12/04/2016 0747   CL 101 06/04/2019 0854   CL 108 07/25/2017 1131   CO2 33 (H) 06/04/2019 0854   CO2 27 07/25/2017 1131   CO2 25 12/04/2016 0747   BUN 16 06/04/2019 0854   BUN 12 07/25/2017 1131   BUN 13.2 12/04/2016 0747   CREATININE 0.70 06/04/2019 0854   CREATININE 0.7 07/25/2017 1131   CREATININE 0.7 12/04/2016 0747      Component Value Date/Time   CALCIUM 9.8 06/04/2019 0854   CALCIUM 9.4 07/25/2017 1131   CALCIUM 9.3 12/04/2016 0747   ALKPHOS 60 06/04/2019 0854   ALKPHOS 65 07/25/2017 1131   ALKPHOS 64 12/04/2016 0747   AST 17 06/04/2019 0854   AST 17 12/04/2016 0747   ALT 11 06/04/2019 0854   ALT 20 07/25/2017 1131   ALT 10 12/04/2016 0747   BILITOT 0.6 06/04/2019 0854   BILITOT 0.73 12/04/2016 0747       Impression and Plan: Pamela Ray is a very pleasant53 yo caucasian female with 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 She had a lumpectomy followed by radiation and is now on Fareston. She will complete a full 10 years of Fareston (2024).  She continues to do well and there has  been no evidence of recurrence.  Vitamin D and Fareston both refilled.  Iron studies are pending. We will replace if needed.  We will go ahead and plan to see her back in another 6 months.  She will contact our office with any questions or concerns. We can certainly see her sooner if needed.   Pamela Peace, NP 4/21/20218:50 AM

## 2019-12-09 ENCOUNTER — Ambulatory Visit: Payer: 59 | Admitting: Psychology

## 2019-12-09 ENCOUNTER — Encounter: Payer: Self-pay | Admitting: Family

## 2019-12-10 ENCOUNTER — Inpatient Hospital Stay: Payer: 59

## 2019-12-10 ENCOUNTER — Other Ambulatory Visit: Payer: Self-pay | Admitting: Family

## 2019-12-10 ENCOUNTER — Other Ambulatory Visit: Payer: Self-pay

## 2019-12-10 VITALS — BP 103/74 | HR 70 | Temp 97.1°F | Resp 16

## 2019-12-10 DIAGNOSIS — D508 Other iron deficiency anemias: Secondary | ICD-10-CM

## 2019-12-10 DIAGNOSIS — D0512 Intraductal carcinoma in situ of left breast: Secondary | ICD-10-CM | POA: Diagnosis not present

## 2019-12-10 MED ORDER — SODIUM CHLORIDE 0.9 % IV SOLN
200.0000 mg | Freq: Once | INTRAVENOUS | Status: AC
  Administered 2019-12-10: 200 mg via INTRAVENOUS
  Filled 2019-12-10: qty 10

## 2019-12-10 MED ORDER — SODIUM CHLORIDE 0.9 % IV SOLN
Freq: Once | INTRAVENOUS | Status: AC
  Filled 2019-12-10: qty 250

## 2019-12-10 NOTE — Patient Instructions (Signed)

## 2019-12-13 ENCOUNTER — Other Ambulatory Visit: Payer: Self-pay

## 2019-12-13 ENCOUNTER — Inpatient Hospital Stay: Payer: 59

## 2019-12-13 VITALS — BP 109/89 | HR 74 | Temp 97.6°F | Resp 17

## 2019-12-13 DIAGNOSIS — D508 Other iron deficiency anemias: Secondary | ICD-10-CM

## 2019-12-13 DIAGNOSIS — D0512 Intraductal carcinoma in situ of left breast: Secondary | ICD-10-CM | POA: Diagnosis not present

## 2019-12-13 MED ORDER — SODIUM CHLORIDE 0.9 % IV SOLN
200.0000 mg | Freq: Once | INTRAVENOUS | Status: AC
  Administered 2019-12-13: 200 mg via INTRAVENOUS
  Filled 2019-12-13: qty 200

## 2019-12-13 MED ORDER — SODIUM CHLORIDE 0.9 % IV SOLN
Freq: Once | INTRAVENOUS | Status: AC
  Filled 2019-12-13: qty 250

## 2019-12-16 ENCOUNTER — Ambulatory Visit: Payer: 59 | Admitting: Psychology

## 2019-12-16 ENCOUNTER — Other Ambulatory Visit: Payer: Self-pay

## 2019-12-16 ENCOUNTER — Inpatient Hospital Stay: Payer: 59

## 2019-12-16 VITALS — BP 108/64 | HR 72 | Temp 97.5°F | Resp 18

## 2019-12-16 DIAGNOSIS — D0512 Intraductal carcinoma in situ of left breast: Secondary | ICD-10-CM | POA: Diagnosis not present

## 2019-12-16 DIAGNOSIS — D508 Other iron deficiency anemias: Secondary | ICD-10-CM

## 2019-12-16 MED ORDER — SODIUM CHLORIDE 0.9 % IV SOLN
200.0000 mg | Freq: Once | INTRAVENOUS | Status: AC
  Administered 2019-12-16: 200 mg via INTRAVENOUS
  Filled 2019-12-16: qty 200

## 2019-12-16 MED ORDER — SODIUM CHLORIDE 0.9 % IV SOLN
Freq: Once | INTRAVENOUS | Status: AC
  Filled 2019-12-16: qty 250

## 2019-12-23 ENCOUNTER — Ambulatory Visit: Payer: 59 | Admitting: Psychology

## 2019-12-30 ENCOUNTER — Ambulatory Visit: Payer: 59 | Admitting: Psychology

## 2020-01-06 ENCOUNTER — Ambulatory Visit: Payer: 59 | Admitting: Psychology

## 2020-01-13 ENCOUNTER — Ambulatory Visit: Payer: 59 | Admitting: Psychology

## 2020-01-20 ENCOUNTER — Ambulatory Visit (INDEPENDENT_AMBULATORY_CARE_PROVIDER_SITE_OTHER): Payer: 59 | Admitting: Psychology

## 2020-01-20 DIAGNOSIS — F411 Generalized anxiety disorder: Secondary | ICD-10-CM | POA: Diagnosis not present

## 2020-01-20 DIAGNOSIS — F332 Major depressive disorder, recurrent severe without psychotic features: Secondary | ICD-10-CM

## 2020-01-27 ENCOUNTER — Ambulatory Visit: Payer: 59 | Admitting: Psychology

## 2020-02-03 ENCOUNTER — Ambulatory Visit (INDEPENDENT_AMBULATORY_CARE_PROVIDER_SITE_OTHER): Payer: 59 | Admitting: Psychology

## 2020-02-03 DIAGNOSIS — F411 Generalized anxiety disorder: Secondary | ICD-10-CM | POA: Diagnosis not present

## 2020-02-03 DIAGNOSIS — F332 Major depressive disorder, recurrent severe without psychotic features: Secondary | ICD-10-CM | POA: Diagnosis not present

## 2020-02-15 ENCOUNTER — Encounter: Payer: Self-pay | Admitting: Family

## 2020-02-15 ENCOUNTER — Other Ambulatory Visit: Payer: Self-pay | Admitting: *Deleted

## 2020-02-15 DIAGNOSIS — E559 Vitamin D deficiency, unspecified: Secondary | ICD-10-CM

## 2020-02-15 DIAGNOSIS — D508 Other iron deficiency anemias: Secondary | ICD-10-CM

## 2020-02-15 DIAGNOSIS — D509 Iron deficiency anemia, unspecified: Secondary | ICD-10-CM

## 2020-02-17 ENCOUNTER — Inpatient Hospital Stay: Payer: 59 | Attending: Family

## 2020-02-17 ENCOUNTER — Ambulatory Visit (INDEPENDENT_AMBULATORY_CARE_PROVIDER_SITE_OTHER): Payer: 59 | Admitting: Psychology

## 2020-02-17 ENCOUNTER — Other Ambulatory Visit: Payer: Self-pay

## 2020-02-17 DIAGNOSIS — D509 Iron deficiency anemia, unspecified: Secondary | ICD-10-CM | POA: Diagnosis present

## 2020-02-17 DIAGNOSIS — D508 Other iron deficiency anemias: Secondary | ICD-10-CM

## 2020-02-17 DIAGNOSIS — E559 Vitamin D deficiency, unspecified: Secondary | ICD-10-CM

## 2020-02-17 DIAGNOSIS — F332 Major depressive disorder, recurrent severe without psychotic features: Secondary | ICD-10-CM

## 2020-02-17 DIAGNOSIS — F411 Generalized anxiety disorder: Secondary | ICD-10-CM | POA: Diagnosis not present

## 2020-02-17 LAB — CBC WITH DIFFERENTIAL (CANCER CENTER ONLY)
Abs Immature Granulocytes: 0.01 10*3/uL (ref 0.00–0.07)
Basophils Absolute: 0 10*3/uL (ref 0.0–0.1)
Basophils Relative: 0 %
Eosinophils Absolute: 0.1 10*3/uL (ref 0.0–0.5)
Eosinophils Relative: 2 %
HCT: 40.6 % (ref 36.0–46.0)
Hemoglobin: 13.6 g/dL (ref 12.0–15.0)
Immature Granulocytes: 0 %
Lymphocytes Relative: 39 %
Lymphs Abs: 1.8 10*3/uL (ref 0.7–4.0)
MCH: 29.1 pg (ref 26.0–34.0)
MCHC: 33.5 g/dL (ref 30.0–36.0)
MCV: 86.9 fL (ref 80.0–100.0)
Monocytes Absolute: 0.4 10*3/uL (ref 0.1–1.0)
Monocytes Relative: 9 %
Neutro Abs: 2.4 10*3/uL (ref 1.7–7.7)
Neutrophils Relative %: 50 %
Platelet Count: 166 10*3/uL (ref 150–400)
RBC: 4.67 MIL/uL (ref 3.87–5.11)
RDW: 14.3 % (ref 11.5–15.5)
WBC Count: 4.7 10*3/uL (ref 4.0–10.5)
nRBC: 0 % (ref 0.0–0.2)

## 2020-02-18 LAB — FERRITIN: Ferritin: 63 ng/mL (ref 11–307)

## 2020-02-18 LAB — IRON AND TIBC
Iron: 80 ug/dL (ref 41–142)
Saturation Ratios: 23 % (ref 21–57)
TIBC: 344 ug/dL (ref 236–444)
UIBC: 263 ug/dL (ref 120–384)

## 2020-03-02 ENCOUNTER — Ambulatory Visit: Payer: 59 | Admitting: Psychology

## 2020-03-16 ENCOUNTER — Ambulatory Visit: Payer: 59 | Admitting: Psychology

## 2020-03-30 ENCOUNTER — Ambulatory Visit: Payer: 59 | Admitting: Psychology

## 2020-04-13 ENCOUNTER — Ambulatory Visit: Payer: 59 | Admitting: Psychology

## 2020-04-27 ENCOUNTER — Ambulatory Visit: Payer: 59 | Admitting: Psychology

## 2020-05-11 ENCOUNTER — Ambulatory Visit: Payer: 59 | Admitting: Psychology

## 2020-05-25 ENCOUNTER — Ambulatory Visit: Payer: 59 | Admitting: Psychology

## 2020-06-02 ENCOUNTER — Other Ambulatory Visit: Payer: Self-pay | Admitting: *Deleted

## 2020-06-02 ENCOUNTER — Other Ambulatory Visit: Payer: Self-pay

## 2020-06-02 ENCOUNTER — Encounter: Payer: Self-pay | Admitting: Family

## 2020-06-02 ENCOUNTER — Inpatient Hospital Stay: Payer: 59 | Attending: Family | Admitting: Family

## 2020-06-02 ENCOUNTER — Inpatient Hospital Stay: Payer: 59

## 2020-06-02 VITALS — BP 109/69 | HR 69 | Temp 98.0°F | Resp 16 | Wt 136.0 lb

## 2020-06-02 DIAGNOSIS — E559 Vitamin D deficiency, unspecified: Secondary | ICD-10-CM | POA: Diagnosis not present

## 2020-06-02 DIAGNOSIS — D509 Iron deficiency anemia, unspecified: Secondary | ICD-10-CM | POA: Diagnosis not present

## 2020-06-02 DIAGNOSIS — R232 Flushing: Secondary | ICD-10-CM

## 2020-06-02 DIAGNOSIS — R61 Generalized hyperhidrosis: Secondary | ICD-10-CM | POA: Diagnosis not present

## 2020-06-02 DIAGNOSIS — M818 Other osteoporosis without current pathological fracture: Secondary | ICD-10-CM

## 2020-06-02 DIAGNOSIS — D0512 Intraductal carcinoma in situ of left breast: Secondary | ICD-10-CM

## 2020-06-02 DIAGNOSIS — N951 Menopausal and female climacteric states: Secondary | ICD-10-CM | POA: Diagnosis not present

## 2020-06-02 DIAGNOSIS — T386X5A Adverse effect of antigonadotrophins, antiestrogens, antiandrogens, not elsewhere classified, initial encounter: Secondary | ICD-10-CM

## 2020-06-02 DIAGNOSIS — D508 Other iron deficiency anemias: Secondary | ICD-10-CM

## 2020-06-02 LAB — CBC WITH DIFFERENTIAL (CANCER CENTER ONLY)
Abs Immature Granulocytes: 0.01 10*3/uL (ref 0.00–0.07)
Basophils Absolute: 0 10*3/uL (ref 0.0–0.1)
Basophils Relative: 1 %
Eosinophils Absolute: 0.1 10*3/uL (ref 0.0–0.5)
Eosinophils Relative: 2 %
HCT: 41.2 % (ref 36.0–46.0)
Hemoglobin: 13.5 g/dL (ref 12.0–15.0)
Immature Granulocytes: 0 %
Lymphocytes Relative: 38 %
Lymphs Abs: 2 10*3/uL (ref 0.7–4.0)
MCH: 29.3 pg (ref 26.0–34.0)
MCHC: 32.8 g/dL (ref 30.0–36.0)
MCV: 89.4 fL (ref 80.0–100.0)
Monocytes Absolute: 0.5 10*3/uL (ref 0.1–1.0)
Monocytes Relative: 9 %
Neutro Abs: 2.6 10*3/uL (ref 1.7–7.7)
Neutrophils Relative %: 50 %
Platelet Count: 195 10*3/uL (ref 150–400)
RBC: 4.61 MIL/uL (ref 3.87–5.11)
RDW: 14.2 % (ref 11.5–15.5)
WBC Count: 5.2 10*3/uL (ref 4.0–10.5)
nRBC: 0 % (ref 0.0–0.2)

## 2020-06-02 LAB — CMP (CANCER CENTER ONLY)
ALT: 8 U/L (ref 0–44)
AST: 15 U/L (ref 15–41)
Albumin: 4.1 g/dL (ref 3.5–5.0)
Alkaline Phosphatase: 57 U/L (ref 38–126)
Anion gap: 6 (ref 5–15)
BUN: 13 mg/dL (ref 6–20)
CO2: 29 mmol/L (ref 22–32)
Calcium: 9.7 mg/dL (ref 8.9–10.3)
Chloride: 105 mmol/L (ref 98–111)
Creatinine: 0.64 mg/dL (ref 0.44–1.00)
GFR, Estimated: >60 mL/min (ref >60)
Glucose, Bld: 118 mg/dL — ABNORMAL HIGH (ref 70–99)
Potassium: 4 mmol/L (ref 3.5–5.1)
Sodium: 140 mmol/L (ref 135–145)
Total Bilirubin: 0.3 mg/dL (ref 0.3–1.2)
Total Protein: 6.4 g/dL — ABNORMAL LOW (ref 6.5–8.1)

## 2020-06-02 LAB — VITAMIN D 25 HYDROXY (VIT D DEFICIENCY, FRACTURES): Vit D, 25-Hydroxy: 53.94 ng/mL (ref 30–100)

## 2020-06-02 MED ORDER — VENLAFAXINE HCL 37.5 MG PO TABS: 37.5000 mg | ORAL_TABLET | Freq: Two times a day (BID) | ORAL | 3 refills | Status: DC

## 2020-06-02 MED ORDER — VENLAFAXINE HCL ER 37.5 MG PO CP24
37.5000 mg | ORAL_CAPSULE | Freq: Every day | ORAL | 3 refills | Status: DC
Start: 2020-06-02 — End: 2021-06-08

## 2020-06-02 NOTE — Progress Notes (Addendum)
Hematology and Oncology Follow Up Visit  Cesily Cuoco [id number] Jul 05, 1966 54 y.o. 06/02/2020   Principle Diagnosis:  DCIS of the left breast Recurrent iron deficiency anemia  Current Therapy: IV ironas indicated - Injectafer (reacted to Feraheme) Fareston 60mg  po q day - will complete 10 years in 2024   Interim History:  Ms. Nihiser is here today for follow-up. She is doing well but having a hard time with hot flashes and night sweats. She would like to give Effexor XR a try.  She is taking her Fareston daily as prescribed.  She had her mammogram on 05/18/2020 and results was negative. She will be due again in a year.  She has not had a cycle since June.  No blood loss noted. No bruising or petechiae.  She has problems with chronic migraines and is currently on Botox injections. She also has Imitrex that she takes as well. These help but have not eliminated her migraines all together.  No fever, chills, n/v, cough, rash, dizziness, SOB, chest pain, palpitations, abdominal pain or changes in bowel or bladder habits.  No swelling, tenderness, numbness or tingling in her extremities.  No falls or syncope.  She has maintained a good appetite and is staying well hydrated. Her weight is stable.   ECOG Performance Status: 1 - Symptomatic but completely ambulatory  Medications:  Allergies as of 06/02/2020      Reactions   Amoxicillin Itching   Cephalexin Itching   Penicillins Hives      Medication List       Accurate as of June 02, 2020  3:45 PM. If you have any questions, ask your nurse or doctor.        ALPRAZolam 0.25 MG tablet Commonly known as: XANAX Take 1 tablet (0.25 mg total) by mouth 3 (three) times daily as needed. for anxiety   Boric Acid Gran Insert 1 capsule in vagina every night x 14 days then every Monday and Thursday night indefinitely   Botulinum Toxin Type A (Cosm) 100 units Solr Every 3 months   botulinum toxin Type A 100 units Solr  injection Commonly known as: BOTOX Inject as directed.   ergocalciferol 1.25 MG (50000 UT) capsule Commonly known as: VITAMIN D2 Take 1 capsule (50,000 Units total) by mouth once a week.   hydrocortisone 2.5 % ointment   Hydrocortisone Ace-Pramoxine 2.5-1 % Crea Place rectally.   ibuprofen 200 MG tablet Commonly known as: ADVIL Take 200 mg by mouth every 6 (six) hours as needed.   SUMAtriptan 100 MG tablet Commonly known as: IMITREX Take by mouth.   Toremifene Citrate 60 MG tablet Commonly known as: FARESTON Take 1 tablet (60 mg total) by mouth daily.   VITAMIN D PO Take by mouth.       Allergies:  Allergies  Allergen Reactions  . Amoxicillin Itching  . Cephalexin Itching  . Penicillins Hives    Past Medical History, Surgical history, Social history, and Family History were reviewed and updated.  Review of Systems: All other 10 point review of systems is negative.   Physical Exam:  vitals were not taken for this visit.   Wt Readings from Last 3 Encounters:  12/08/19 146 lb (66.2 kg)  06/04/19 150 lb 12.8 oz (68.4 kg)  02/18/19 148 lb 12.8 oz (67.5 kg)    Ocular: Sclerae unicteric, pupils equal, round and reactive to light Ear-nose-throat: Oropharynx clear, dentition fair Lymphatic: No cervical, supraclavicular or axillary adenopathy Lungs no rales or rhonchi, good excursion bilaterally Heart  regular rate and rhythm, no murmur appreciated Abd soft, nontender, positive bowel sounds MSK no focal spinal tenderness, no joint edema Neuro: non-focal, well-oriented, appropriate affect Breasts: Deferred this visit, no changes per patient on routine self breast exams.   Lab Results  Component Value Date   WBC 5.2 06/02/2020   HGB 13.5 06/02/2020   HCT 41.2 06/02/2020   MCV 89.4 06/02/2020   PLT 195 06/02/2020   Lab Results  Component Value Date   FERRITIN 63 02/17/2020   IRON 80 02/17/2020   TIBC 344 02/17/2020   UIBC 263 02/17/2020   IRONPCTSAT 23  02/17/2020   Lab Results  Component Value Date   RETICCTPCT 1.0 05/12/2015   RBC 4.61 06/02/2020   RETICCTABS 48.9 05/12/2015   No results found for: KPAFRELGTCHN, LAMBDASER, KAPLAMBRATIO No results found for: IGGSERUM, IGA, IGMSERUM No results found for: Odetta Pink, SPEI   Chemistry      Component Value Date/Time   NA 139 12/08/2019 0828   NA 141 07/25/2017 1131   NA 140 12/04/2016 0747   K 4.0 12/08/2019 0828   K 3.9 07/25/2017 1131   K 4.2 12/04/2016 0747   CL 108 12/08/2019 0828   CL 108 07/25/2017 1131   CO2 23 12/08/2019 0828   CO2 27 07/25/2017 1131   CO2 25 12/04/2016 0747   BUN 14 12/08/2019 0828   BUN 12 07/25/2017 1131   BUN 13.2 12/04/2016 0747   CREATININE 0.65 12/08/2019 0828   CREATININE 0.7 07/25/2017 1131   CREATININE 0.7 12/04/2016 0747      Component Value Date/Time   CALCIUM 9.2 12/08/2019 0828   CALCIUM 9.4 07/25/2017 1131   CALCIUM 9.3 12/04/2016 0747   ALKPHOS 63 12/08/2019 0828   ALKPHOS 65 07/25/2017 1131   ALKPHOS 64 12/04/2016 0747   AST 17 12/08/2019 0828   AST 17 12/04/2016 0747   ALT 10 12/08/2019 0828   ALT 20 07/25/2017 1131   ALT 10 12/04/2016 0747   BILITOT 0.5 12/08/2019 0828   BILITOT 0.73 12/04/2016 0747       Impression and Plan: Ms. Romaniello is a very pleasant53yo caucasian female with 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 She had a lumpectomy followed by radiation and is now on Fareston. She will complete a full 10 years of Fareston (2024).  Iron studies are pending. We will replace if needed.  We will have her try Effexor XR for the hot flashes and night sweats. Hopefully this will help. We discussed potential side effects of the new drug and she verbalized understanding.   She will contact our office with any questions or concerns.  Follow-up in 6 months.   Laverna Peace, NP 10/15/20213:45 PM

## 2020-06-05 ENCOUNTER — Telehealth: Payer: Self-pay | Admitting: Hematology & Oncology

## 2020-06-05 ENCOUNTER — Telehealth: Payer: Self-pay | Admitting: Family

## 2020-06-05 LAB — FERRITIN: Ferritin: 43 ng/mL (ref 11–307)

## 2020-06-05 LAB — IRON AND TIBC
Iron: 57 ug/dL (ref 41–142)
Saturation Ratios: 16 % — ABNORMAL LOW (ref 21–57)
TIBC: 364 ug/dL (ref 236–444)
UIBC: 307 ug/dL (ref 120–384)

## 2020-06-05 NOTE — Telephone Encounter (Signed)
Appointments scheduled calendar mailed per 10/18 sch msg

## 2020-06-05 NOTE — Telephone Encounter (Signed)
No los 10/15

## 2020-06-08 ENCOUNTER — Ambulatory Visit: Payer: 59 | Admitting: Psychology

## 2020-06-13 ENCOUNTER — Inpatient Hospital Stay: Payer: 59

## 2020-06-13 ENCOUNTER — Other Ambulatory Visit: Payer: Self-pay

## 2020-06-13 VITALS — BP 109/73 | HR 71 | Temp 98.7°F | Resp 16

## 2020-06-13 DIAGNOSIS — D509 Iron deficiency anemia, unspecified: Secondary | ICD-10-CM | POA: Diagnosis not present

## 2020-06-13 DIAGNOSIS — D508 Other iron deficiency anemias: Secondary | ICD-10-CM

## 2020-06-13 MED ORDER — SODIUM CHLORIDE 0.9 % IV SOLN
200.0000 mg | Freq: Once | INTRAVENOUS | Status: AC
  Administered 2020-06-13: 200 mg via INTRAVENOUS
  Filled 2020-06-13: qty 200

## 2020-06-13 MED ORDER — SODIUM CHLORIDE 0.9 % IV SOLN
Freq: Once | INTRAVENOUS | Status: AC
  Filled 2020-06-13: qty 250

## 2020-06-16 ENCOUNTER — Inpatient Hospital Stay: Payer: 59

## 2020-06-16 ENCOUNTER — Other Ambulatory Visit: Payer: Self-pay

## 2020-06-16 VITALS — BP 97/73 | HR 73 | Temp 98.6°F | Resp 17

## 2020-06-16 DIAGNOSIS — D508 Other iron deficiency anemias: Secondary | ICD-10-CM

## 2020-06-16 DIAGNOSIS — D509 Iron deficiency anemia, unspecified: Secondary | ICD-10-CM | POA: Diagnosis not present

## 2020-06-16 MED ORDER — SODIUM CHLORIDE 0.9 % IV SOLN
Freq: Once | INTRAVENOUS | Status: AC
  Filled 2020-06-16: qty 250

## 2020-06-16 MED ORDER — SODIUM CHLORIDE 0.9 % IV SOLN
200.0000 mg | Freq: Once | INTRAVENOUS | Status: AC
  Administered 2020-06-16: 200 mg via INTRAVENOUS
  Filled 2020-06-16: qty 200

## 2020-06-16 NOTE — Patient Instructions (Signed)

## 2020-06-16 NOTE — Progress Notes (Signed)
Pt declined to stay for post infusion observation period. Pt stated she has tolerated medication multiple times prior without difficulty. Pt aware to call clinic with any questions or concerns. Pt verbalized understanding and had no further questions.  Pt discharged in stable condition.  

## 2020-06-20 ENCOUNTER — Inpatient Hospital Stay: Payer: 59 | Attending: Family

## 2020-06-20 ENCOUNTER — Other Ambulatory Visit: Payer: Self-pay

## 2020-06-20 VITALS — BP 94/65 | HR 73 | Temp 98.2°F

## 2020-06-20 DIAGNOSIS — D509 Iron deficiency anemia, unspecified: Secondary | ICD-10-CM | POA: Diagnosis present

## 2020-06-20 DIAGNOSIS — D508 Other iron deficiency anemias: Secondary | ICD-10-CM

## 2020-06-20 MED ORDER — SODIUM CHLORIDE 0.9 % IV SOLN
200.0000 mg | Freq: Once | INTRAVENOUS | Status: AC
  Administered 2020-06-20: 200 mg via INTRAVENOUS
  Filled 2020-06-20: qty 200

## 2020-06-20 NOTE — Progress Notes (Signed)
Pt discharged in no apparent distress. Pt left ambulatory without assistance. Pt aware of discharge instructions and verbalized understanding and had no further questions.  

## 2020-06-20 NOTE — Patient Instructions (Signed)

## 2020-06-22 ENCOUNTER — Ambulatory Visit: Payer: 59 | Admitting: Psychology

## 2020-07-06 ENCOUNTER — Ambulatory Visit (INDEPENDENT_AMBULATORY_CARE_PROVIDER_SITE_OTHER): Payer: 59 | Admitting: Psychology

## 2020-07-06 DIAGNOSIS — F411 Generalized anxiety disorder: Secondary | ICD-10-CM

## 2020-07-06 DIAGNOSIS — F332 Major depressive disorder, recurrent severe without psychotic features: Secondary | ICD-10-CM

## 2020-07-20 ENCOUNTER — Ambulatory Visit: Payer: 59 | Admitting: Hematology and Oncology

## 2020-07-20 ENCOUNTER — Ambulatory Visit: Payer: 59 | Admitting: Psychology

## 2020-07-28 ENCOUNTER — Ambulatory Visit (INDEPENDENT_AMBULATORY_CARE_PROVIDER_SITE_OTHER): Payer: 59 | Admitting: Psychology

## 2020-07-28 DIAGNOSIS — F411 Generalized anxiety disorder: Secondary | ICD-10-CM

## 2020-07-28 DIAGNOSIS — F332 Major depressive disorder, recurrent severe without psychotic features: Secondary | ICD-10-CM | POA: Diagnosis not present

## 2020-08-03 ENCOUNTER — Ambulatory Visit: Payer: 59 | Admitting: Psychology

## 2020-08-09 ENCOUNTER — Ambulatory Visit (INDEPENDENT_AMBULATORY_CARE_PROVIDER_SITE_OTHER): Payer: 59 | Admitting: Psychology

## 2020-08-09 DIAGNOSIS — F332 Major depressive disorder, recurrent severe without psychotic features: Secondary | ICD-10-CM | POA: Diagnosis not present

## 2020-08-09 DIAGNOSIS — F411 Generalized anxiety disorder: Secondary | ICD-10-CM

## 2020-08-17 ENCOUNTER — Ambulatory Visit: Payer: 59 | Admitting: Psychology

## 2020-08-31 ENCOUNTER — Ambulatory Visit (INDEPENDENT_AMBULATORY_CARE_PROVIDER_SITE_OTHER): Payer: 59 | Admitting: Psychology

## 2020-08-31 DIAGNOSIS — F411 Generalized anxiety disorder: Secondary | ICD-10-CM

## 2020-08-31 DIAGNOSIS — F332 Major depressive disorder, recurrent severe without psychotic features: Secondary | ICD-10-CM | POA: Diagnosis not present

## 2020-09-14 ENCOUNTER — Ambulatory Visit: Payer: 59 | Admitting: Psychology

## 2020-09-28 ENCOUNTER — Ambulatory Visit: Payer: 59 | Admitting: Psychology

## 2020-10-04 ENCOUNTER — Encounter: Payer: Self-pay | Admitting: Family

## 2020-10-04 ENCOUNTER — Other Ambulatory Visit: Payer: Self-pay | Admitting: *Deleted

## 2020-10-04 DIAGNOSIS — D0512 Intraductal carcinoma in situ of left breast: Secondary | ICD-10-CM

## 2020-10-04 MED ORDER — TOREMIFENE CITRATE 60 MG PO TABS: 60.0000 mg | ORAL_TABLET | Freq: Every day | ORAL | 8 refills | Status: DC

## 2020-10-12 ENCOUNTER — Ambulatory Visit: Payer: 59 | Admitting: Psychology

## 2020-10-19 ENCOUNTER — Telehealth: Payer: Self-pay

## 2020-10-19 NOTE — Telephone Encounter (Signed)
Pt called in to r/s her appts due to a conflict   Pamela Ray

## 2020-10-25 ENCOUNTER — Ambulatory Visit: Payer: 59 | Admitting: Psychology

## 2020-10-26 ENCOUNTER — Ambulatory Visit: Payer: 59 | Admitting: Psychology

## 2020-11-09 ENCOUNTER — Ambulatory Visit: Payer: 59 | Admitting: Psychology

## 2020-11-13 ENCOUNTER — Ambulatory Visit (INDEPENDENT_AMBULATORY_CARE_PROVIDER_SITE_OTHER): Payer: 59 | Admitting: Psychology

## 2020-11-13 DIAGNOSIS — F332 Major depressive disorder, recurrent severe without psychotic features: Secondary | ICD-10-CM | POA: Diagnosis not present

## 2020-11-13 DIAGNOSIS — F411 Generalized anxiety disorder: Secondary | ICD-10-CM

## 2020-11-23 ENCOUNTER — Ambulatory Visit: Payer: 59 | Admitting: Psychology

## 2020-11-27 ENCOUNTER — Ambulatory Visit (INDEPENDENT_AMBULATORY_CARE_PROVIDER_SITE_OTHER): Payer: 59 | Admitting: Psychology

## 2020-11-27 DIAGNOSIS — F332 Major depressive disorder, recurrent severe without psychotic features: Secondary | ICD-10-CM | POA: Diagnosis not present

## 2020-11-27 DIAGNOSIS — F411 Generalized anxiety disorder: Secondary | ICD-10-CM | POA: Diagnosis not present

## 2020-12-01 ENCOUNTER — Ambulatory Visit: Payer: 59 | Admitting: Family

## 2020-12-01 ENCOUNTER — Other Ambulatory Visit: Payer: 59

## 2020-12-07 ENCOUNTER — Ambulatory Visit (INDEPENDENT_AMBULATORY_CARE_PROVIDER_SITE_OTHER): Payer: 59 | Admitting: Psychology

## 2020-12-07 DIAGNOSIS — F411 Generalized anxiety disorder: Secondary | ICD-10-CM

## 2020-12-07 DIAGNOSIS — F332 Major depressive disorder, recurrent severe without psychotic features: Secondary | ICD-10-CM

## 2020-12-08 ENCOUNTER — Inpatient Hospital Stay (HOSPITAL_BASED_OUTPATIENT_CLINIC_OR_DEPARTMENT_OTHER): Payer: 59 | Admitting: Family

## 2020-12-08 ENCOUNTER — Other Ambulatory Visit: Payer: Self-pay | Admitting: *Deleted

## 2020-12-08 ENCOUNTER — Other Ambulatory Visit: Payer: Self-pay

## 2020-12-08 ENCOUNTER — Encounter: Payer: Self-pay | Admitting: Family

## 2020-12-08 ENCOUNTER — Inpatient Hospital Stay: Payer: 59 | Attending: Family

## 2020-12-08 VITALS — BP 118/68 | Temp 98.1°F | Resp 18 | Wt 137.1 lb

## 2020-12-08 DIAGNOSIS — E559 Vitamin D deficiency, unspecified: Secondary | ICD-10-CM

## 2020-12-08 DIAGNOSIS — Z733 Stress, not elsewhere classified: Secondary | ICD-10-CM | POA: Insufficient documentation

## 2020-12-08 DIAGNOSIS — D509 Iron deficiency anemia, unspecified: Secondary | ICD-10-CM | POA: Insufficient documentation

## 2020-12-08 DIAGNOSIS — Z923 Personal history of irradiation: Secondary | ICD-10-CM | POA: Diagnosis not present

## 2020-12-08 DIAGNOSIS — D0512 Intraductal carcinoma in situ of left breast: Secondary | ICD-10-CM | POA: Insufficient documentation

## 2020-12-08 DIAGNOSIS — M818 Other osteoporosis without current pathological fracture: Secondary | ICD-10-CM

## 2020-12-08 DIAGNOSIS — F064 Anxiety disorder due to known physiological condition: Secondary | ICD-10-CM

## 2020-12-08 DIAGNOSIS — T386X5A Adverse effect of antigonadotrophins, antiestrogens, antiandrogens, not elsewhere classified, initial encounter: Secondary | ICD-10-CM

## 2020-12-08 DIAGNOSIS — Z79899 Other long term (current) drug therapy: Secondary | ICD-10-CM | POA: Insufficient documentation

## 2020-12-08 DIAGNOSIS — Z780 Asymptomatic menopausal state: Secondary | ICD-10-CM | POA: Insufficient documentation

## 2020-12-08 LAB — CBC WITH DIFFERENTIAL (CANCER CENTER ONLY)
Abs Immature Granulocytes: 0.01 10*3/uL (ref 0.00–0.07)
Basophils Absolute: 0 10*3/uL (ref 0.0–0.1)
Basophils Relative: 1 %
Eosinophils Absolute: 0.1 10*3/uL (ref 0.0–0.5)
Eosinophils Relative: 2 %
HCT: 42.3 % (ref 36.0–46.0)
Hemoglobin: 14.1 g/dL (ref 12.0–15.0)
Immature Granulocytes: 0 %
Lymphocytes Relative: 35 %
Lymphs Abs: 1.7 10*3/uL (ref 0.7–4.0)
MCH: 28.7 pg (ref 26.0–34.0)
MCHC: 33.3 g/dL (ref 30.0–36.0)
MCV: 86.2 fL (ref 80.0–100.0)
Monocytes Absolute: 0.4 10*3/uL (ref 0.1–1.0)
Monocytes Relative: 9 %
Neutro Abs: 2.7 10*3/uL (ref 1.7–7.7)
Neutrophils Relative %: 53 %
Platelet Count: 222 10*3/uL (ref 150–400)
RBC: 4.91 MIL/uL (ref 3.87–5.11)
RDW: 14.3 % (ref 11.5–15.5)
WBC Count: 4.9 10*3/uL (ref 4.0–10.5)
nRBC: 0 % (ref 0.0–0.2)

## 2020-12-08 LAB — CMP (CANCER CENTER ONLY)
ALT: 10 U/L (ref 0–44)
AST: 15 U/L (ref 15–41)
Albumin: 4.3 g/dL (ref 3.5–5.0)
Alkaline Phosphatase: 71 U/L (ref 38–126)
Anion gap: 5 (ref 5–15)
BUN: 17 mg/dL (ref 6–20)
CO2: 32 mmol/L (ref 22–32)
Calcium: 10.2 mg/dL (ref 8.9–10.3)
Chloride: 103 mmol/L (ref 98–111)
Creatinine: 0.63 mg/dL (ref 0.44–1.00)
GFR, Estimated: >60 mL/min (ref >60)
Glucose, Bld: 127 mg/dL — ABNORMAL HIGH (ref 70–99)
Potassium: 4.7 mmol/L (ref 3.5–5.1)
Sodium: 140 mmol/L (ref 135–145)
Total Bilirubin: 0.4 mg/dL (ref 0.3–1.2)
Total Protein: 7 g/dL (ref 6.5–8.1)

## 2020-12-08 LAB — VITAMIN D 25 HYDROXY (VIT D DEFICIENCY, FRACTURES): Vit D, 25-Hydroxy: 31.71 ng/mL (ref 30–100)

## 2020-12-08 MED ORDER — ALPRAZOLAM 0.25 MG PO TABS: 0.2500 mg | ORAL_TABLET | Freq: Three times a day (TID) | ORAL | 1 refills | Status: DC | PRN

## 2020-12-08 NOTE — Progress Notes (Signed)
Hematology and Oncology Follow Up Visit  Pamela Ray [id number] [birth year] 55 y.o. 12/08/2020   Principle Diagnosis:  DCIS of the left breast Recurrent iron deficiency anemia  Current Therapy: IV ironas indicated - Injectafer (reacted to Feraheme) Fareston 60mg  po q day - will complete 10 years in 2024   Interim History:  Pamela Ray is here today for follow-up. She is doing well but notes fatigue. She is under some stress at home and has not been resting well. Mammogram in September 2021 was negative.  Bilateral breast exam today is negative. Lumpectomy scar at the 12-1 o'clock position intact. No mass, lesion or rash noted.  She is still having hot flashes on Fareston and was unable to start the Effexor due to her current migraine medication.  No fever, chills, n/v, cough, rash, dizziness, SOB, chest pain, palpitations, abdominal pain or changes in bowel or bladder habits.  No blood loss noted. No bruising or petechiae.  No swelling, tenderness, numbness or tingling in her extremities.  No falls or syncope.  She has maintained a good appetite and is doing her best to stay well hydrated. Her weight is stable at 137 lbs.   ECOG Performance Status: 1 - Symptomatic but completely ambulatory  Medications:  Allergies as of 12/08/2020      Reactions   Amoxicillin Itching   Cephalexin Itching   Penicillins Hives      Medication List       Accurate as of December 08, 2020  3:22 PM. If you have any questions, ask your nurse or doctor.        ALPRAZolam 0.25 MG tablet Commonly known as: XANAX Take 1 tablet (0.25 mg total) by mouth 3 (three) times daily as needed. for anxiety   Boric Acid Gran Insert 1 capsule in vagina every night x 14 days then every Monday and Thursday night indefinitely   Botulinum Toxin Type A (Cosm) 100 units Solr Every 3 months   chlorproMAZINE 25 MG tablet Commonly known as: THORAZINE   ergocalciferol 1.25 MG (50000 UT) capsule Commonly known  as: VITAMIN D2 Take 1 capsule (50,000 Units total) by mouth once a week.   hydrocortisone 2.5 % ointment   ibuprofen 200 MG tablet Commonly known as: ADVIL Take 200 mg by mouth every 6 (six) hours as needed.   SUMAtriptan 100 MG tablet Commonly known as: IMITREX Take by mouth.   Toremifene Citrate 60 MG tablet Commonly known as: FARESTON Take 1 tablet (60 mg total) by mouth daily.   venlafaxine XR 37.5 MG 24 hr capsule Commonly known as: EFFEXOR-XR Take 1 capsule (37.5 mg total) by mouth daily with breakfast.       Allergies:  Allergies  Allergen Reactions  . Amoxicillin Itching  . Cephalexin Itching  . Penicillins Hives    Past Medical History, Surgical history, Social history, and Family History were reviewed and updated.  Review of Systems: All other 10 point review of systems is negative.   Physical Exam:  weight is 137 lb 1.9 oz (62.2 kg). Her oral temperature is 98.1 F (36.7 C). Her blood pressure is 118/68. Her respiration is 18 and oxygen saturation is 100%.   Wt Readings from Last 3 Encounters:  12/08/20 137 lb 1.9 oz (62.2 kg)  06/02/20 136 lb (61.7 kg)  12/08/19 146 lb (66.2 kg)    Ocular: Sclerae unicteric, pupils equal, round and reactive to light Ear-nose-throat: Oropharynx clear, dentition fair Lymphatic: No cervical, supraclavicular or axillary adenopathy Lungs no rales or rhonchi,  good excursion bilaterally Heart regular rate and rhythm, no murmur appreciated Abd soft, nontender, positive bowel sounds MSK no focal spinal tenderness, no joint edema Neuro: non-focal, well-oriented, appropriate affect Breasts: Bilateral breast exam today is negative. Lumpectomy scar at the 12-1 o'clock position intact. No mass, lesion or rash noted.   Lab Results  Component Value Date   WBC 4.9 12/08/2020   HGB 14.1 12/08/2020   HCT 42.3 12/08/2020   MCV 86.2 12/08/2020   PLT 222 12/08/2020   Lab Results  Component Value Date   FERRITIN 43 06/02/2020    IRON 57 06/02/2020   TIBC 364 06/02/2020   UIBC 307 06/02/2020   IRONPCTSAT 16 (L) 06/02/2020   Lab Results  Component Value Date   RETICCTPCT 1.0 05/12/2015   RBC 4.91 12/08/2020   RETICCTABS 48.9 05/12/2015   No results found for: KPAFRELGTCHN, LAMBDASER, KAPLAMBRATIO No results found for: IGGSERUM, IGA, IGMSERUM No results found for: Odetta Pink, SPEI   Chemistry      Component Value Date/Time   NA 140 12/08/2020 1418   NA 141 07/25/2017 1131   NA 140 12/04/2016 0747   K 4.7 12/08/2020 1418   K 3.9 07/25/2017 1131   K 4.2 12/04/2016 0747   CL 103 12/08/2020 1418   CL 108 07/25/2017 1131   CO2 32 12/08/2020 1418   CO2 27 07/25/2017 1131   CO2 25 12/04/2016 0747   BUN 17 12/08/2020 1418   BUN 12 07/25/2017 1131   BUN 13.2 12/04/2016 0747   CREATININE 0.63 12/08/2020 1418   CREATININE 0.7 07/25/2017 1131   CREATININE 0.7 12/04/2016 0747      Component Value Date/Time   CALCIUM 10.2 12/08/2020 1418   CALCIUM 9.4 07/25/2017 1131   CALCIUM 9.3 12/04/2016 0747   ALKPHOS 71 12/08/2020 1418   ALKPHOS 65 07/25/2017 1131   ALKPHOS 64 12/04/2016 0747   AST 15 12/08/2020 1418   AST 17 12/04/2016 0747   ALT 10 12/08/2020 1418   ALT 20 07/25/2017 1131   ALT 10 12/04/2016 0747   BILITOT 0.4 12/08/2020 1418   BILITOT 0.73 12/04/2016 0747       Impression and Plan: Pamela Ray is a very pleasant55yo caucasian female with 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 She had a lumpectomy followed by radiation and is now on Fareston. She will complete a full 10 years of Fareston (2024).  So far, there has been no evidence of recurrence.  Mammogram due again in September 2022.  Iron studies pending. We will replace if needed.  Xanax refilled today.  Follow-up in 6 months.  She was encouraged to contact our office with any questions or concerns.   Laverna Peace, NP 4/22/20223:22 PM

## 2020-12-11 LAB — FERRITIN: Ferritin: 43 ng/mL (ref 11–307)

## 2020-12-11 LAB — IRON AND TIBC
Iron: 99 ug/dL (ref 41–142)
Saturation Ratios: 26 % (ref 21–57)
TIBC: 388 ug/dL (ref 236–444)
UIBC: 288 ug/dL (ref 120–384)

## 2020-12-12 ENCOUNTER — Telehealth: Payer: Self-pay

## 2020-12-12 NOTE — Telephone Encounter (Signed)
Called pt with her appts per 12/08/20 los  [employer]

## 2020-12-13 ENCOUNTER — Encounter: Payer: Self-pay | Admitting: Family

## 2020-12-21 ENCOUNTER — Ambulatory Visit (INDEPENDENT_AMBULATORY_CARE_PROVIDER_SITE_OTHER): Payer: 59 | Admitting: Psychology

## 2020-12-21 DIAGNOSIS — F411 Generalized anxiety disorder: Secondary | ICD-10-CM

## 2020-12-21 DIAGNOSIS — F332 Major depressive disorder, recurrent severe without psychotic features: Secondary | ICD-10-CM

## 2021-01-04 ENCOUNTER — Ambulatory Visit: Payer: 59 | Admitting: Psychology

## 2021-01-18 ENCOUNTER — Ambulatory Visit (INDEPENDENT_AMBULATORY_CARE_PROVIDER_SITE_OTHER): Payer: 59 | Admitting: Psychology

## 2021-01-18 DIAGNOSIS — F411 Generalized anxiety disorder: Secondary | ICD-10-CM | POA: Diagnosis not present

## 2021-01-18 DIAGNOSIS — F332 Major depressive disorder, recurrent severe without psychotic features: Secondary | ICD-10-CM | POA: Diagnosis not present

## 2021-02-01 ENCOUNTER — Ambulatory Visit (INDEPENDENT_AMBULATORY_CARE_PROVIDER_SITE_OTHER): Payer: 59 | Admitting: Psychology

## 2021-02-01 DIAGNOSIS — F411 Generalized anxiety disorder: Secondary | ICD-10-CM

## 2021-02-01 DIAGNOSIS — F332 Major depressive disorder, recurrent severe without psychotic features: Secondary | ICD-10-CM

## 2021-02-14 ENCOUNTER — Encounter: Payer: Self-pay | Admitting: *Deleted

## 2021-02-15 ENCOUNTER — Ambulatory Visit (INDEPENDENT_AMBULATORY_CARE_PROVIDER_SITE_OTHER): Payer: 59 | Admitting: Psychology

## 2021-02-15 DIAGNOSIS — F332 Major depressive disorder, recurrent severe without psychotic features: Secondary | ICD-10-CM

## 2021-02-15 DIAGNOSIS — F411 Generalized anxiety disorder: Secondary | ICD-10-CM | POA: Diagnosis not present

## 2021-03-01 ENCOUNTER — Ambulatory Visit (INDEPENDENT_AMBULATORY_CARE_PROVIDER_SITE_OTHER): Payer: 59 | Admitting: Psychology

## 2021-03-01 DIAGNOSIS — F411 Generalized anxiety disorder: Secondary | ICD-10-CM

## 2021-03-01 DIAGNOSIS — F332 Major depressive disorder, recurrent severe without psychotic features: Secondary | ICD-10-CM | POA: Diagnosis not present

## 2021-03-15 ENCOUNTER — Ambulatory Visit (INDEPENDENT_AMBULATORY_CARE_PROVIDER_SITE_OTHER): Payer: 59 | Admitting: Psychology

## 2021-03-15 DIAGNOSIS — F332 Major depressive disorder, recurrent severe without psychotic features: Secondary | ICD-10-CM

## 2021-03-15 DIAGNOSIS — F411 Generalized anxiety disorder: Secondary | ICD-10-CM | POA: Diagnosis not present

## 2021-03-16 ENCOUNTER — Encounter: Payer: Self-pay | Admitting: Family

## 2021-03-21 ENCOUNTER — Encounter: Payer: Self-pay | Admitting: Family

## 2021-03-23 ENCOUNTER — Other Ambulatory Visit: Payer: Self-pay | Admitting: *Deleted

## 2021-03-23 ENCOUNTER — Inpatient Hospital Stay: Payer: 59 | Attending: Hematology and Oncology

## 2021-03-23 ENCOUNTER — Other Ambulatory Visit: Payer: Self-pay

## 2021-03-23 ENCOUNTER — Telehealth: Payer: Self-pay | Admitting: *Deleted

## 2021-03-23 ENCOUNTER — Other Ambulatory Visit: Payer: 59

## 2021-03-23 ENCOUNTER — Encounter: Payer: Self-pay | Admitting: Family

## 2021-03-23 DIAGNOSIS — D0512 Intraductal carcinoma in situ of left breast: Secondary | ICD-10-CM

## 2021-03-23 DIAGNOSIS — D509 Iron deficiency anemia, unspecified: Secondary | ICD-10-CM | POA: Diagnosis present

## 2021-03-23 DIAGNOSIS — F064 Anxiety disorder due to known physiological condition: Secondary | ICD-10-CM

## 2021-03-23 DIAGNOSIS — E559 Vitamin D deficiency, unspecified: Secondary | ICD-10-CM

## 2021-03-23 DIAGNOSIS — Z8639 Personal history of other endocrine, nutritional and metabolic disease: Secondary | ICD-10-CM | POA: Insufficient documentation

## 2021-03-23 LAB — CBC WITH DIFFERENTIAL (CANCER CENTER ONLY)
Abs Immature Granulocytes: 0.01 10*3/uL (ref 0.00–0.07)
Basophils Absolute: 0 10*3/uL (ref 0.0–0.1)
Basophils Relative: 1 %
Eosinophils Absolute: 0.1 10*3/uL (ref 0.0–0.5)
Eosinophils Relative: 2 %
HCT: 42 % (ref 36.0–46.0)
Hemoglobin: 14 g/dL (ref 12.0–15.0)
Immature Granulocytes: 0 %
Lymphocytes Relative: 32 %
Lymphs Abs: 2 10*3/uL (ref 0.7–4.0)
MCH: 28.4 pg (ref 26.0–34.0)
MCHC: 33.3 g/dL (ref 30.0–36.0)
MCV: 85.2 fL (ref 80.0–100.0)
Monocytes Absolute: 0.5 10*3/uL (ref 0.1–1.0)
Monocytes Relative: 8 %
Neutro Abs: 3.5 10*3/uL (ref 1.7–7.7)
Neutrophils Relative %: 57 %
Platelet Count: 200 10*3/uL (ref 150–400)
RBC: 4.93 MIL/uL (ref 3.87–5.11)
RDW: 14.2 % (ref 11.5–15.5)
WBC Count: 6.1 10*3/uL (ref 4.0–10.5)
nRBC: 0 % (ref 0.0–0.2)

## 2021-03-23 LAB — CMP (CANCER CENTER ONLY)
ALT: 13 U/L (ref 0–44)
AST: 18 U/L (ref 15–41)
Albumin: 4 g/dL (ref 3.5–5.0)
Alkaline Phosphatase: 72 U/L (ref 38–126)
Anion gap: 10 (ref 5–15)
BUN: 17 mg/dL (ref 6–20)
CO2: 28 mmol/L (ref 22–32)
Calcium: 10.2 mg/dL (ref 8.9–10.3)
Chloride: 102 mmol/L (ref 98–111)
Creatinine: 0.75 mg/dL (ref 0.44–1.00)
GFR, Estimated: >60 mL/min (ref >60)
Glucose, Bld: 91 mg/dL (ref 70–99)
Potassium: 4.2 mmol/L (ref 3.5–5.1)
Sodium: 140 mmol/L (ref 135–145)
Total Bilirubin: 0.5 mg/dL (ref 0.3–1.2)
Total Protein: 7 g/dL (ref 6.5–8.1)

## 2021-03-23 LAB — VITAMIN D 25 HYDROXY (VIT D DEFICIENCY, FRACTURES): Vit D, 25-Hydroxy: 40.15 ng/mL (ref 30–100)

## 2021-03-23 MED ORDER — TOREMIFENE CITRATE 60 MG PO TABS: 60.0000 mg | ORAL_TABLET | Freq: Every day | ORAL | 8 refills | Status: DC

## 2021-03-23 NOTE — Telephone Encounter (Signed)
Called and lvm of lab appointment @ [facility] - requested call back to confirm

## 2021-03-26 LAB — IRON AND TIBC
Iron: 86 ug/dL (ref 41–142)
Saturation Ratios: 21 % (ref 21–57)
TIBC: 411 ug/dL (ref 236–444)
UIBC: 326 ug/dL (ref 120–384)

## 2021-03-26 LAB — FERRITIN: Ferritin: 29 ng/mL (ref 11–307)

## 2021-03-29 ENCOUNTER — Ambulatory Visit: Payer: 59 | Admitting: Psychology

## 2021-04-12 ENCOUNTER — Ambulatory Visit: Payer: 59 | Admitting: Psychology

## 2021-04-26 ENCOUNTER — Other Ambulatory Visit: Payer: Self-pay | Admitting: Family

## 2021-04-26 ENCOUNTER — Ambulatory Visit (INDEPENDENT_AMBULATORY_CARE_PROVIDER_SITE_OTHER): Payer: 59 | Admitting: Psychology

## 2021-04-26 DIAGNOSIS — E559 Vitamin D deficiency, unspecified: Secondary | ICD-10-CM

## 2021-04-26 DIAGNOSIS — F332 Major depressive disorder, recurrent severe without psychotic features: Secondary | ICD-10-CM

## 2021-04-26 DIAGNOSIS — Z8249 Family history of ischemic heart disease and other diseases of the circulatory system: Secondary | ICD-10-CM

## 2021-04-26 DIAGNOSIS — T386X5A Adverse effect of antigonadotrophins, antiestrogens, antiandrogens, not elsewhere classified, initial encounter: Secondary | ICD-10-CM

## 2021-04-26 DIAGNOSIS — D509 Iron deficiency anemia, unspecified: Secondary | ICD-10-CM

## 2021-04-26 DIAGNOSIS — F411 Generalized anxiety disorder: Secondary | ICD-10-CM

## 2021-04-26 DIAGNOSIS — F064 Anxiety disorder due to known physiological condition: Secondary | ICD-10-CM

## 2021-04-26 DIAGNOSIS — R002 Palpitations: Secondary | ICD-10-CM

## 2021-04-26 DIAGNOSIS — E7801 Familial hypercholesterolemia: Secondary | ICD-10-CM

## 2021-04-26 DIAGNOSIS — M818 Other osteoporosis without current pathological fracture: Secondary | ICD-10-CM

## 2021-04-26 DIAGNOSIS — D0512 Intraductal carcinoma in situ of left breast: Secondary | ICD-10-CM

## 2021-04-26 DIAGNOSIS — R739 Hyperglycemia, unspecified: Secondary | ICD-10-CM

## 2021-04-26 DIAGNOSIS — D508 Other iron deficiency anemias: Secondary | ICD-10-CM

## 2021-04-26 DIAGNOSIS — M810 Age-related osteoporosis without current pathological fracture: Secondary | ICD-10-CM

## 2021-05-07 ENCOUNTER — Ambulatory Visit (INDEPENDENT_AMBULATORY_CARE_PROVIDER_SITE_OTHER): Payer: 59 | Admitting: Psychology

## 2021-05-07 DIAGNOSIS — F411 Generalized anxiety disorder: Secondary | ICD-10-CM

## 2021-05-07 DIAGNOSIS — F332 Major depressive disorder, recurrent severe without psychotic features: Secondary | ICD-10-CM

## 2021-05-24 ENCOUNTER — Ambulatory Visit (INDEPENDENT_AMBULATORY_CARE_PROVIDER_SITE_OTHER): Payer: 59 | Admitting: Psychology

## 2021-05-24 DIAGNOSIS — F411 Generalized anxiety disorder: Secondary | ICD-10-CM | POA: Diagnosis not present

## 2021-05-24 DIAGNOSIS — F332 Major depressive disorder, recurrent severe without psychotic features: Secondary | ICD-10-CM

## 2021-06-07 ENCOUNTER — Other Ambulatory Visit: Payer: Self-pay | Admitting: Family

## 2021-06-07 ENCOUNTER — Ambulatory Visit: Payer: 59 | Admitting: Psychology

## 2021-06-07 DIAGNOSIS — D509 Iron deficiency anemia, unspecified: Secondary | ICD-10-CM

## 2021-06-07 DIAGNOSIS — E559 Vitamin D deficiency, unspecified: Secondary | ICD-10-CM

## 2021-06-07 DIAGNOSIS — D0512 Intraductal carcinoma in situ of left breast: Secondary | ICD-10-CM

## 2021-06-08 ENCOUNTER — Telehealth: Payer: Self-pay | Admitting: *Deleted

## 2021-06-08 ENCOUNTER — Inpatient Hospital Stay: Payer: 59 | Attending: Hematology and Oncology

## 2021-06-08 ENCOUNTER — Encounter: Payer: Self-pay | Admitting: Family

## 2021-06-08 ENCOUNTER — Other Ambulatory Visit: Payer: Self-pay

## 2021-06-08 ENCOUNTER — Inpatient Hospital Stay (HOSPITAL_BASED_OUTPATIENT_CLINIC_OR_DEPARTMENT_OTHER): Payer: 59 | Admitting: Family

## 2021-06-08 VITALS — BP 111/76 | HR 78 | Temp 97.9°F | Resp 18 | Ht 66.14 in | Wt 137.1 lb

## 2021-06-08 DIAGNOSIS — D0512 Intraductal carcinoma in situ of left breast: Secondary | ICD-10-CM | POA: Diagnosis not present

## 2021-06-08 DIAGNOSIS — E559 Vitamin D deficiency, unspecified: Secondary | ICD-10-CM

## 2021-06-08 DIAGNOSIS — Z923 Personal history of irradiation: Secondary | ICD-10-CM | POA: Insufficient documentation

## 2021-06-08 DIAGNOSIS — D509 Iron deficiency anemia, unspecified: Secondary | ICD-10-CM | POA: Insufficient documentation

## 2021-06-08 LAB — CBC WITH DIFFERENTIAL (CANCER CENTER ONLY)
Abs Immature Granulocytes: 0.01 10*3/uL (ref 0.00–0.07)
Basophils Absolute: 0 10*3/uL (ref 0.0–0.1)
Basophils Relative: 1 %
Eosinophils Absolute: 0.1 10*3/uL (ref 0.0–0.5)
Eosinophils Relative: 1 %
HCT: 30.8 % — ABNORMAL LOW (ref 36.0–46.0)
Hemoglobin: 9.4 g/dL — ABNORMAL LOW (ref 12.0–15.0)
Immature Granulocytes: 0 %
Lymphocytes Relative: 47 %
Lymphs Abs: 1.8 10*3/uL (ref 0.7–4.0)
MCH: 22.9 pg — ABNORMAL LOW (ref 26.0–34.0)
MCHC: 30.5 g/dL (ref 30.0–36.0)
MCV: 75.1 fL — ABNORMAL LOW (ref 80.0–100.0)
Monocytes Absolute: 0.4 10*3/uL (ref 0.1–1.0)
Monocytes Relative: 11 %
Neutro Abs: 1.6 10*3/uL — ABNORMAL LOW (ref 1.7–7.7)
Neutrophils Relative %: 40 %
Platelet Count: 274 10*3/uL (ref 150–400)
RBC: 4.1 MIL/uL (ref 3.87–5.11)
RDW: 17.9 % — ABNORMAL HIGH (ref 11.5–15.5)
WBC Count: 3.8 10*3/uL — ABNORMAL LOW (ref 4.0–10.5)
nRBC: 0 % (ref 0.0–0.2)

## 2021-06-08 LAB — CMP (CANCER CENTER ONLY)
ALT: 9 U/L (ref 0–44)
AST: 16 U/L (ref 15–41)
Albumin: 4.1 g/dL (ref 3.5–5.0)
Alkaline Phosphatase: 63 U/L (ref 38–126)
Anion gap: 5 (ref 5–15)
BUN: 15 mg/dL (ref 6–20)
CO2: 31 mmol/L (ref 22–32)
Calcium: 10.1 mg/dL (ref 8.9–10.3)
Chloride: 106 mmol/L (ref 98–111)
Creatinine: 0.6 mg/dL (ref 0.44–1.00)
GFR, Estimated: >60 mL/min (ref >60)
Glucose, Bld: 115 mg/dL — ABNORMAL HIGH (ref 70–99)
Potassium: 4.7 mmol/L (ref 3.5–5.1)
Sodium: 142 mmol/L (ref 135–145)
Total Bilirubin: 0.3 mg/dL (ref 0.3–1.2)
Total Protein: 6.6 g/dL (ref 6.5–8.1)

## 2021-06-08 LAB — VITAMIN D 25 HYDROXY (VIT D DEFICIENCY, FRACTURES): Vit D, 25-Hydroxy: 30.24 ng/mL (ref 30–100)

## 2021-06-08 NOTE — Telephone Encounter (Signed)
Per 06/08/21 los gave upcoming appointment - confirmed

## 2021-06-08 NOTE — Progress Notes (Signed)
Hematology and Oncology Follow Up Visit  Pamela Ray [id number] 03-Mar-1966 55 y.o. 06/08/2021   Principle Diagnosis:  DCIS of the left breast Recurrent iron deficiency anemia Vitamin D deficeincy   Current Therapy:        Fareston 60mg  po q day - will complete 10 years in 2024 IV iron as indicated - Injectafer (reacted to Feraheme) Vitamin D 50,000 units PO weekly    Interim History:  Pamela Ray is here today for follow-up. She is doing well but does note some fatigue.  Iron studies and vitamin D pending.  She had her cycle for the first time in 10 months in September. She states that it was extremely heavy and lasted 2 weeks.  She has occasional bleeding from hemorrhoids  She has had palpitations and associated hot flashes of and on.  No fever chills, n/v, cough, rash, dizziness, SOB, chest pain, abdominal pain or changes in bowel or bladder habits.  She had her mammogram 2 weeks ago with [facility] and her result was negative. She is in their survivorship program and follows up regularly. She plans to speak with them about having a bone density scan as well.  No swelling, tenderness, numbness or tingling in her extremities at this time.  No falls or syncope.  She has maintained a good appetite and is staying well hydrated. Her weight is stable at 137 lbs.   ECOG Performance Status: 1 - Symptomatic but completely ambulatory  Medications:  Allergies as of 06/08/2021       Reactions   Amoxicillin Itching   Cephalexin Itching   Penicillins Hives        Medication List        Accurate as of June 08, 2021  3:25 PM. If you have any questions, ask your nurse or doctor.          STOP taking these medications    venlafaxine XR 37.5 MG 24 hr capsule Commonly known as: EFFEXOR-XR Stopped by: [name], NP       TAKE these medications    ALPRAZolam 0.25 MG tablet Commonly known as: XANAX Take 1 tablet (0.25 mg total) by mouth 3 (three) times daily as needed.  for anxiety   Boric Acid Gran Insert 1 capsule in vagina every night x 14 days then every Monday and Thursday night indefinitely   Botulinum Toxin Type A (Cosm) 100 units Solr Every 3 months   chlorproMAZINE 25 MG tablet Commonly known as: THORAZINE   hydrocortisone 2.5 % ointment   ibuprofen 200 MG tablet Commonly known as: ADVIL Take 200 mg by mouth every 6 (six) hours as needed.   SUMAtriptan 100 MG tablet Commonly known as: IMITREX Take by mouth.   Toremifene Citrate 60 MG tablet Commonly known as: FARESTON Take 1 tablet (60 mg total) by mouth daily.   Vitamin D (Ergocalciferol) 1.25 MG (50000 UNIT) Caps capsule Commonly known as: DRISDOL TAKE 1 CAPSULE BY MOUTH  ONCE WEEKLY        Allergies:  Allergies  Allergen Reactions   Amoxicillin Itching   Cephalexin Itching   Penicillins Hives    Past Medical History, Surgical history, Social history, and Family History were reviewed and updated.  Review of Systems: All other 10 point review of systems is negative.   Physical Exam:  height is 5' 6.14" (1.68 m) and weight is 137 lb 1.9 oz (62.2 kg). Her oral temperature is 97.9 F (36.6 C). Her blood pressure is 111/76 and her pulse is 78.  Her respiration is 18 and oxygen saturation is 100%.   Wt Readings from Last 3 Encounters:  06/08/21 137 lb 1.9 oz (62.2 kg)  12/08/20 137 lb 1.9 oz (62.2 kg)  06/02/20 136 lb (61.7 kg)    Ocular: Sclerae unicteric, pupils equal, round and reactive to light Ear-nose-throat: Oropharynx clear, dentition fair Lymphatic: No cervical or supraclavicular adenopathy Lungs no rales or rhonchi, good excursion bilaterally Heart regular rate and rhythm, no murmur appreciated Abd soft, nontender, positive bowel sounds MSK no focal spinal tenderness, no joint edema Neuro: non-focal, well-oriented, appropriate affect Breasts: Deferred   Lab Results  Component Value Date   WBC 3.8 (L) 06/08/2021   HGB 9.4 (L) 06/08/2021   HCT 30.8  (L) 06/08/2021   MCV 75.1 (L) 06/08/2021   PLT 274 06/08/2021   Lab Results  Component Value Date   FERRITIN 29 03/23/2021   IRON 86 03/23/2021   TIBC 411 03/23/2021   UIBC 326 03/23/2021   IRONPCTSAT 21 03/23/2021   Lab Results  Component Value Date   RETICCTPCT 1.0 05/12/2015   RBC 4.10 06/08/2021   RETICCTABS 48.9 05/12/2015   No results found for: KPAFRELGTCHN, LAMBDASER, KAPLAMBRATIO No results found for: IGGSERUM, IGA, IGMSERUM No results found for: Odetta Pink, SPEI   Chemistry      Component Value Date/Time   NA 142 06/08/2021 1408   NA 141 07/25/2017 1131   NA 140 12/04/2016 0747   K 4.7 06/08/2021 1408   K 3.9 07/25/2017 1131   K 4.2 12/04/2016 0747   CL 106 06/08/2021 1408   CL 108 07/25/2017 1131   CO2 31 06/08/2021 1408   CO2 27 07/25/2017 1131   CO2 25 12/04/2016 0747   BUN 15 06/08/2021 1408   BUN 12 07/25/2017 1131   BUN 13.2 12/04/2016 0747   CREATININE 0.60 06/08/2021 1408   CREATININE 0.7 07/25/2017 1131   CREATININE 0.7 12/04/2016 0747      Component Value Date/Time   CALCIUM 10.1 06/08/2021 1408   CALCIUM 9.4 07/25/2017 1131   CALCIUM 9.3 12/04/2016 0747   ALKPHOS 63 06/08/2021 1408   ALKPHOS 65 07/25/2017 1131   ALKPHOS 64 12/04/2016 0747   AST 16 06/08/2021 1408   AST 17 12/04/2016 0747   ALT 9 06/08/2021 1408   ALT 20 07/25/2017 1131   ALT 10 12/04/2016 0747   BILITOT 0.3 06/08/2021 1408   BILITOT 0.73 12/04/2016 0747       Impression and Plan: Pamela Ray is a very pleasant 55 yo caucasian female with 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 She had a lumpectomy followed by radiation and is now on Fareston. She will complete a full 10 years of Fareston (2024).  So far she continues to do well and there has been no evidence of recurrence.  Iron and Vitamin D studies are pending.  Follow-up in 6 months.  She can contact our office with any questions or  concerns.   [name], NP 10/21/20223:25 PM

## 2021-06-11 LAB — IRON AND TIBC
Iron: 16 ug/dL — ABNORMAL LOW (ref 28–170)
Saturation Ratios: 3 % — ABNORMAL LOW (ref 10.4–31.8)
TIBC: 543 ug/dL — ABNORMAL HIGH (ref 250–450)
UIBC: 527 ug/dL

## 2021-06-11 LAB — FERRITIN: Ferritin: 6 ng/mL — ABNORMAL LOW (ref 11–307)

## 2021-06-12 ENCOUNTER — Encounter: Payer: Self-pay | Admitting: Family

## 2021-06-15 ENCOUNTER — Inpatient Hospital Stay: Payer: 59

## 2021-06-15 ENCOUNTER — Other Ambulatory Visit: Payer: Self-pay

## 2021-06-15 VITALS — BP 115/81 | HR 70 | Temp 98.0°F | Resp 20

## 2021-06-15 DIAGNOSIS — D509 Iron deficiency anemia, unspecified: Secondary | ICD-10-CM

## 2021-06-15 DIAGNOSIS — D0512 Intraductal carcinoma in situ of left breast: Secondary | ICD-10-CM

## 2021-06-15 DIAGNOSIS — D508 Other iron deficiency anemias: Secondary | ICD-10-CM

## 2021-06-15 MED ORDER — SODIUM CHLORIDE 0.9 % IV SOLN
200.0000 mg | Freq: Once | INTRAVENOUS | Status: AC
  Administered 2021-06-15: 200 mg via INTRAVENOUS
  Filled 2021-06-15: qty 200

## 2021-06-15 MED ORDER — SODIUM CHLORIDE 0.9 % IV SOLN: INTRAVENOUS | Status: DC

## 2021-06-15 NOTE — Patient Instructions (Signed)

## 2021-06-21 ENCOUNTER — Ambulatory Visit (INDEPENDENT_AMBULATORY_CARE_PROVIDER_SITE_OTHER): Payer: 59 | Admitting: Psychology

## 2021-06-21 DIAGNOSIS — F332 Major depressive disorder, recurrent severe without psychotic features: Secondary | ICD-10-CM

## 2021-06-21 DIAGNOSIS — F411 Generalized anxiety disorder: Secondary | ICD-10-CM

## 2021-06-22 ENCOUNTER — Inpatient Hospital Stay: Payer: 59

## 2021-06-29 ENCOUNTER — Inpatient Hospital Stay: Payer: 59 | Attending: Hematology and Oncology

## 2021-06-29 ENCOUNTER — Other Ambulatory Visit: Payer: Self-pay

## 2021-06-29 VITALS — BP 102/62 | HR 74 | Temp 97.9°F | Resp 17

## 2021-06-29 DIAGNOSIS — D509 Iron deficiency anemia, unspecified: Secondary | ICD-10-CM | POA: Insufficient documentation

## 2021-06-29 DIAGNOSIS — D508 Other iron deficiency anemias: Secondary | ICD-10-CM

## 2021-06-29 MED ORDER — ONDANSETRON HCL 8 MG PO TABS
4.0000 mg | ORAL_TABLET | Freq: Once | ORAL | Status: AC
  Administered 2021-06-29: 4 mg via ORAL
  Filled 2021-06-29: qty 1

## 2021-06-29 MED ORDER — SODIUM CHLORIDE 0.9 % IV SOLN
200.0000 mg | Freq: Once | INTRAVENOUS | Status: AC
  Administered 2021-06-29: 200 mg via INTRAVENOUS
  Filled 2021-06-29: qty 200

## 2021-06-29 NOTE — Progress Notes (Signed)
Pt c/o feeling "hot and nauseated" while receiving post infusion fluids. Pt denies any other complaints. Vitals as charted; refer to flowsheet. [name], NP aware and order received for oral zofran. Pt aware of plan and had no further questions.   Pt discharged with no complaints of nausea. VSS.

## 2021-06-29 NOTE — Patient Instructions (Signed)

## 2021-07-05 ENCOUNTER — Ambulatory Visit: Payer: 59 | Admitting: Psychology

## 2021-07-06 ENCOUNTER — Other Ambulatory Visit: Payer: Self-pay

## 2021-07-06 ENCOUNTER — Inpatient Hospital Stay: Payer: 59

## 2021-07-06 VITALS — BP 111/66 | HR 76 | Temp 98.0°F | Resp 17

## 2021-07-06 DIAGNOSIS — D508 Other iron deficiency anemias: Secondary | ICD-10-CM

## 2021-07-06 DIAGNOSIS — D509 Iron deficiency anemia, unspecified: Secondary | ICD-10-CM | POA: Diagnosis not present

## 2021-07-06 MED ORDER — SODIUM CHLORIDE 0.9 % IV SOLN
200.0000 mg | Freq: Once | INTRAVENOUS | Status: AC
  Administered 2021-07-06: 200 mg via INTRAVENOUS
  Filled 2021-07-06: qty 200

## 2021-07-06 NOTE — Patient Instructions (Signed)

## 2021-07-13 ENCOUNTER — Ambulatory Visit: Payer: 59

## 2021-07-16 ENCOUNTER — Ambulatory Visit: Payer: 59

## 2021-07-19 ENCOUNTER — Ambulatory Visit: Payer: 59 | Admitting: Psychology

## 2021-07-20 ENCOUNTER — Other Ambulatory Visit: Payer: Self-pay

## 2021-07-20 ENCOUNTER — Inpatient Hospital Stay: Payer: 59 | Attending: Hematology and Oncology

## 2021-07-20 VITALS — BP 100/81 | HR 61 | Temp 98.7°F | Resp 16

## 2021-07-20 DIAGNOSIS — D508 Other iron deficiency anemias: Secondary | ICD-10-CM

## 2021-07-20 DIAGNOSIS — D509 Iron deficiency anemia, unspecified: Secondary | ICD-10-CM | POA: Diagnosis not present

## 2021-07-20 MED ORDER — SODIUM CHLORIDE 0.9 % IV SOLN
200.0000 mg | Freq: Once | INTRAVENOUS | Status: AC
  Administered 2021-07-20: 200 mg via INTRAVENOUS
  Filled 2021-07-20: qty 200

## 2021-07-20 MED ORDER — SODIUM CHLORIDE 0.9 % IV SOLN: INTRAVENOUS | Status: DC

## 2021-07-20 NOTE — Patient Instructions (Signed)
[facility]  Discharge Instructions: Thank you for choosing [facility] to provide your oncology and hematology care.   If you have a lab appointment with the Cancer Center, please go directly to the Cancer Center and check in at the registration area.  Wear comfortable clothing and clothing appropriate for easy access to any Portacath or PICC line.   We strive to give you quality time with your provider. You may need to reschedule your appointment if you arrive late (15 or more minutes).  Arriving late affects you and other patients whose appointments are after yours.  Also, if you miss three or more appointments without notifying the office, you may be dismissed from the clinic at the provider's discretion.      For prescription refill requests, have your pharmacy contact our office and allow 72 hours for refills to be completed.    Today you received the following chemotherapy and/or immunotherapy agents Venofer.   To help prevent nausea and vomiting after your treatment, we encourage you to take your nausea medication as directed.  BELOW ARE SYMPTOMS THAT SHOULD BE REPORTED IMMEDIATELY: *FEVER GREATER THAN 100.4 F (38 C) OR HIGHER *CHILLS OR SWEATING *NAUSEA AND VOMITING THAT IS NOT CONTROLLED WITH YOUR NAUSEA MEDICATION *UNUSUAL SHORTNESS OF BREATH *UNUSUAL BRUISING OR BLEEDING *URINARY PROBLEMS (pain or burning when urinating, or frequent urination) *BOWEL PROBLEMS (unusual diarrhea, constipation, pain near the anus) TENDERNESS IN MOUTH AND THROAT WITH OR WITHOUT PRESENCE OF ULCERS (sore throat, sores in mouth, or a toothache) UNUSUAL RASH, SWELLING OR PAIN  UNUSUAL VAGINAL DISCHARGE OR ITCHING   Items with * indicate a potential emergency and should be followed up as soon as possible or go to the Emergency Department if any problems should occur.  Please show the CHEMOTHERAPY ALERT CARD or IMMUNOTHERAPY ALERT CARD at check-in to the  Emergency Department and triage nurse. Should you have questions after your visit or need to cancel or reschedule your appointment, please contact [facility]  [phone] and follow the prompts.  Office hours are 8:00 a.m. to 4:30 p.m. Monday - Friday. Please note that voicemails left after 4:00 p.m. may not be returned until the following business day.  We are closed weekends and major holidays. You have access to a nurse at all times for urgent questions. Please call the main number to the clinic [phone] and follow the prompts.  For any non-urgent questions, you may also contact your provider using MyChart. We now offer e-Visits for anyone 18 and older to request care online for non-urgent symptoms. For details visit [email or web address].   Also download the MyChart app! Go to the app store, search "MyChart", open the app, select [place], and log in with your MyChart username and password.  Due to Covid, a mask is required upon entering the hospital/clinic. If you do not have a mask, one will be given to you upon arrival. For doctor visits, patients may have 1 support person aged 18 or older with them. For treatment visits, patients cannot have anyone with them due to current Covid guidelines and our immunocompromised population.  

## 2021-07-27 ENCOUNTER — Other Ambulatory Visit: Payer: Self-pay

## 2021-07-27 ENCOUNTER — Inpatient Hospital Stay: Payer: 59

## 2021-07-27 VITALS — BP 100/57 | HR 76 | Temp 98.1°F | Resp 17

## 2021-07-27 DIAGNOSIS — D508 Other iron deficiency anemias: Secondary | ICD-10-CM

## 2021-07-27 DIAGNOSIS — D509 Iron deficiency anemia, unspecified: Secondary | ICD-10-CM | POA: Diagnosis not present

## 2021-07-27 MED ORDER — SODIUM CHLORIDE 0.9 % IV SOLN
200.0000 mg | Freq: Once | INTRAVENOUS | Status: AC
  Administered 2021-07-27: 200 mg via INTRAVENOUS
  Filled 2021-07-27: qty 200

## 2021-07-27 NOTE — Patient Instructions (Signed)

## 2021-08-02 ENCOUNTER — Ambulatory Visit: Payer: 59 | Admitting: Psychology

## 2021-08-16 ENCOUNTER — Ambulatory Visit: Payer: 59 | Admitting: Psychology

## 2021-08-17 ENCOUNTER — Other Ambulatory Visit: Payer: Self-pay | Admitting: Family

## 2021-08-17 ENCOUNTER — Telehealth: Payer: Self-pay | Admitting: Family

## 2021-08-17 DIAGNOSIS — N840 Polyp of corpus uteri: Secondary | ICD-10-CM

## 2021-08-17 DIAGNOSIS — D0512 Intraductal carcinoma in situ of left breast: Secondary | ICD-10-CM

## 2021-08-17 NOTE — Progress Notes (Signed)
I spoke with Dr. Earleen Newport regarding patient's recent D&C and removal of 4 cm x 4 cm uterine polyp. This was also discussed with Dr. Marin Olp. All are in agreement that patient should stop the Fareston immediately. No other medication changes at this time.  Also, with her history of breast cancer diagnosed in her early 74's and paternal grandmother's history of uterine cancer she is a candidate for genetic counseling. Referral has been placed and scheduling message sent.

## 2021-08-17 NOTE — Telephone Encounter (Signed)
I spoke with the patient regarding recent uterine polyp removal and she agrees to stop taking her Fareston. She also agreed to genetics counseling referral. No questions or concerns at this time. Patient appreciative of call.

## 2021-08-21 ENCOUNTER — Telehealth: Payer: Self-pay | Admitting: Genetic Counselor

## 2021-08-21 NOTE — Telephone Encounter (Signed)
Scheduled appt per 12/30 referral. Pt is aware of appt date and time.

## 2021-08-30 ENCOUNTER — Ambulatory Visit: Payer: 59 | Admitting: Psychology

## 2021-09-13 ENCOUNTER — Ambulatory Visit (INDEPENDENT_AMBULATORY_CARE_PROVIDER_SITE_OTHER): Payer: 59 | Admitting: Psychology

## 2021-09-13 DIAGNOSIS — F411 Generalized anxiety disorder: Secondary | ICD-10-CM

## 2021-09-13 NOTE — Progress Notes (Signed)
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 (Status: maintained) -- No Description Entered  Excessive and/or unrealistic worry that is difficult to control occurring more days than not for at least 6 months about a number of events or activities. (Status: maintained) -- No Description Entered  Feelings of hopelessness, worthlessness, or inappropriate guilt. (Status: maintained) -- No Description Entered  Medication Status compliance  Safety none  If Suicidal or Homicidal State Action Taken: unspecified  Current Risk: low Medications unspecified Objectives Related Problem: Develop healthy thinking patterns and beliefs about self, others, and the world that lead to the alleviation and help prevent the relapse of depression. Description: Learn and implement conflict resolution skills to resolve interpersonal problems. Target Date: 2021-11-02 Frequency: Daily Modality: individual Progress: 70%  Related Problem: Develop healthy thinking patterns and beliefs about self, others, and the world that lead to the alleviation and help prevent the relapse of depression. Description: Learn and implement problem-solving and decision-making skills. Target Date: 2021-11-02 Frequency: Daily Modality: individual Progress: 80%  Related Problem: Develop healthy thinking patterns and beliefs about self, others, and the world that lead to the alleviation and help prevent the relapse of depression. Description: Learn and implement behavioral strategies to overcome depression. Target Date: 2021-11-02 Frequency: Daily Modality: individual Progress: 70%  Related Problem: Develop healthy thinking patterns and beliefs about self, others, and the world that lead to the alleviation and help prevent the relapse of depression. Description: Identify and replace  thoughts and beliefs that support depression. Target Date: 2021-11-02 Frequency: Daily Modality: individual Progress: 80%  Related Problem: Develop healthy thinking patterns and beliefs about self, others, and the world that lead to the alleviation and help prevent the relapse of depression. Description: Describe current and past experiences with depression including their impact on functioning and attempts to resolve it. Target Date: 2021-11-02 Frequency: Daily Modality: individual Progress: 90%  Related Problem: Reduce overall frequency, intensity, and duration of the anxiety so that daily functioning is not impaired. Description: Maintain involvement in work, family, and social activities. Target Date: 2021-11-02 Frequency: Daily Modality: individual Progress: 90%  Related Problem: Reduce overall frequency, intensity, and duration of the anxiety so that daily functioning is not impaired. Description: Learn to accept limitations in life and commit to tolerating, rather than avoiding, unpleasant emotions while accomplishing meaningful goals. Target Date: 2021-11-02 Frequency: Daily Modality: individual Progress: 80%  Related Problem: Reduce overall frequency, intensity, and duration of the anxiety so that daily functioning is not impaired. Description: Learn and implement problem-solving strategies for realistically addressing worries. Target Date: 2021-11-02 Frequency: Daily Modality: individual Progress: 80%  Related Problem: Reduce overall frequency, intensity, and duration of the anxiety so that daily functioning is not impaired. Description: Identify, challenge, and replace biased, fearful self-talk with positive, realistic, and empowering self-talk. Target Date: 2021-11-02 Frequency: Daily Modality: individual Progress: 80%  Related Problem: Reduce overall frequency, intensity, and duration of the anxiety so that daily functioning is not impaired. Description: Describe  situations, thoughts, feelings, and actions associated with anxieties and worries, their impact on functioning, and attempts to resolve them. Target Date: 2021-11-02 Frequency: Daily Modality: individual Progress: 90%  Client Response full compliance  Service Location Location, [address] [place],  [address]  Service Code cpt [phone]  Lifestyle change (exercise, nutrition)  Self-monitoring  Normalize/Reframe  Facilitate problem solving  Identify/label emotions  Validate/empathize  Emotion regulation skills  Self care  activities  Related past to present  Session Notes F33.2, F41.1.  Goals: Resolve grief related to the loss of her mother, foster independence, resolve guilt, manage unresolved family of origin issues, resolve feelings associated with failed marriage, address issues related to establishing new romantic relationship. Also, has some conflicts with her sons that are not resolved. Goal Date is 2-23.  Patient agrees to a virtual video session due to the Coronavirus pandemic. She is at home and I am in my home office.  Juliann Pulse says Christmas went well and there were no family dramas. She is very relieved that her biopsy came back negative. She felt there was a good chance that she had cancer.She visited [place] and [place] and it went well. She felt on egg-shells much of the time. She was frustrated with [place] and her lack of effort to do things around the house. They did not, however, have any conflict. She says her son [name] "does everything" and [place] does "next to nothing". She and her boyfriend are doing well and she is feeling secure in the relationship. Work is also positive and very busy. [name], PhD Time 5:10p-6:05p 55 min.

## 2021-09-24 ENCOUNTER — Inpatient Hospital Stay: Payer: 59 | Attending: Hematology and Oncology | Admitting: Genetic Counselor

## 2021-09-24 ENCOUNTER — Other Ambulatory Visit: Payer: Self-pay

## 2021-09-24 ENCOUNTER — Other Ambulatory Visit: Payer: Self-pay | Admitting: Genetic Counselor

## 2021-09-24 ENCOUNTER — Inpatient Hospital Stay: Payer: 59

## 2021-09-24 DIAGNOSIS — Z8049 Family history of malignant neoplasm of other genital organs: Secondary | ICD-10-CM

## 2021-09-24 DIAGNOSIS — Z853 Personal history of malignant neoplasm of breast: Secondary | ICD-10-CM

## 2021-09-24 DIAGNOSIS — D0512 Intraductal carcinoma in situ of left breast: Secondary | ICD-10-CM

## 2021-09-24 DIAGNOSIS — Z803 Family history of malignant neoplasm of breast: Secondary | ICD-10-CM | POA: Diagnosis not present

## 2021-09-24 LAB — GENETIC SCREENING ORDER

## 2021-09-26 ENCOUNTER — Encounter: Payer: Self-pay | Admitting: Genetic Counselor

## 2021-09-26 DIAGNOSIS — Z853 Personal history of malignant neoplasm of breast: Secondary | ICD-10-CM

## 2021-09-26 DIAGNOSIS — Z803 Family history of malignant neoplasm of breast: Secondary | ICD-10-CM | POA: Insufficient documentation

## 2021-09-26 HISTORY — DX: Family history of malignant neoplasm of breast: Z80.3

## 2021-09-26 HISTORY — DX: Personal history of malignant neoplasm of breast: Z85.3

## 2021-09-27 ENCOUNTER — Ambulatory Visit: Payer: 59 | Admitting: Psychology

## 2021-09-28 ENCOUNTER — Encounter: Payer: Self-pay | Admitting: Family

## 2021-09-28 ENCOUNTER — Encounter: Payer: Self-pay | Admitting: Genetic Counselor

## 2021-09-28 DIAGNOSIS — Z8049 Family history of malignant neoplasm of other genital organs: Secondary | ICD-10-CM

## 2021-09-28 HISTORY — DX: Family history of malignant neoplasm of other genital organs: Z80.49

## 2021-09-28 NOTE — Progress Notes (Signed)
REFERRING PROVIDER: Celso Amy, NP [address] [place],  [place] [address]  PRIMARY PROVIDER:  Midge Minium, MD  PRIMARY REASON FOR VISIT:  1. Personal history of breast cancer   2. Family history of breast cancer   3. Family history of malignant neoplasm of genital organ     HISTORY OF PRESENT ILLNESS:   Pamela Ray, a 56 y.o. female, was seen for a [facility] cancer genetics consultation at the request of [name], NP due to a personal and family history of cancer.  Pamela Ray presents to clinic today to discuss the possibility of a hereditary predisposition to cancer, to discuss genetic testing, and to further clarify her future cancer risks, as well as potential cancer risks for family members.   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  She also had a 4cm x 4cm uterine polyp removed in December 2022.    RISK FACTORS:  Menarche was at age 68.  First live birth at age 11.  OCP use for approximately 8 years.  Ovaries intact: yes.  Hysterectomy: no.   HRT use: 0 years. Colonoscopy: yes;  no reported history of polyps . Mammogram within the last year: yes.   Past Medical History:  Diagnosis Date   Anemia, iron deficiency 10/11/2011   Cancer ([facility])    Chicken pox    DCIS (ductal carcinoma in situ) 02/11/2013   Family history of breast cancer 09/26/2021   Family history of malignant neoplasm of genital organ 09/28/2021   IBS (irritable bowel syndrome)    Kidney stone 12/12   Migraine    Migraine 10/11/2011   Mitral valve prolapse    ? history   Personal history of breast cancer 09/26/2021   Raynaud's phenomenon (by history or observed) 10/11/2011   Seasonal allergies     Past Surgical History:  Procedure Laterality Date   HERNIA REPAIR     double 1999   MASTECTOMY, PARTIAL     TONSILLECTOMY      FAMILY HISTORY:  We obtained a detailed, 4-generation family  history.  Significant diagnoses are listed below: Family History  Problem Relation Age of Onset   Lung cancer Mother        small cell; d. 19   Skin cancer Father        ? BCC; surgery only   Breast cancer Paternal Aunt        dx mid 70s   Uterine cancer Paternal Grandmother        or other GYN cancer; dx unknown age   Ovarian cancer Other        PGM's sister; d. early 39s     Ms. Daniele is unaware of previous family history of genetic testing for hereditary cancer risks. Patient's maternal ancestors are of [place], [place], and [place] descent, and paternal ancestors are of [place] and [place] descent. There is no reported Ashkenazi Jewish ancestry. There is no known consanguinity.  GENETIC COUNSELING ASSESSMENT: Pamela Ray is a 56 y.o. female with a personal and family history of cancer which is somewhat suggestive of a hereditary cancer syndrome and predisposition to cancer given her age of diagnosis and the presence of related cancers in the family. We, therefore, discussed and recommended the following at today's visit.   DISCUSSION: We discussed that 5 - 10% of cancer is hereditary.  Most cases of hereditary breast  cancer are associated with mutations in BRCA1/2.  There are other genes that can be associated with hereditary breast cancer syndromes.  We discussed that testing is beneficial for several reasons including knowing how to follow individuals for their cancer risks and understanding if other family members could be at risk for cancer and allowing them to undergo genetic testing.   We reviewed the characteristics, features and inheritance patterns of hereditary cancer syndromes. We also discussed genetic testing, including the appropriate family members to test, the process of testing, insurance coverage and turn-around-time for results. We discussed the implications of a negative, positive, carrier and/or variant of uncertain significant result. We recommended Pamela Ray pursue genetic  testing for a panel that includes genes associated with breast, ovarian, and uterine cancers.   Pamela Ray  was offered a common hereditary cancer panel (47 genes) and an expanded pan-cancer panel (77 genes). Pamela Ray was informed of the benefits and limitations of each panel, including that expanded pan-cancer panels contain genes that do not have clear management guidelines at this point in time.  We also discussed that as the number of genes included on a panel increases, the chances of variants of uncertain significance increases.  After considering the benefits and limitations of each gene panel, Pamela Ray  elected to have an expanded Radio broadcast assistant through [employer].  The CancerNext-Expanded gene panel offered by [facility] and includes sequencing, rearrangement, and RNA analysis for the following 77 genes: AIP, ALK, APC, ATM, AXIN2, BAP1, BARD1, BLM, BMPR1A, BRCA1, BRCA2, BRIP1, CDC73, CDH1, CDK4, CDKN1B, CDKN2A, CHEK2, CTNNA1, DICER1, FANCC, FH, FLCN, GALNT12, KIF1B, LZTR1, MAX, MEN1, MET, MLH1, MSH2, MSH3, MSH6, MUTYH, NBN, NF1, NF2, NTHL1, PALB2, PHOX2B, PMS2, POT1, PRKAR1A, PTCH1, PTEN, RAD51C, RAD51D, RB1, RECQL, RET, SDHA, SDHAF2, SDHB, SDHC, SDHD, SMAD4, SMARCA4, SMARCB1, SMARCE1, STK11, SUFU, TMEM127, TP53, TSC1, TSC2, VHL and XRCC2 (sequencing and deletion/duplication); EGFR, EGLN1, HOXB13, KIT, MITF, PDGFRA, POLD1, and POLE (sequencing only); EPCAM and GREM1 (deletion/duplication only).   Based on Pamela Ray's personal history of DCIS before age 50, she meets medical criteria for genetic testing. Despite that she meets criteria, she may still have an out of pocket cost. We discussed that if her out of pocket cost for testing is over $100, the laboratory should contact her and discuss the self-pay prices and/or patient pay assistance programs.    PLAN: After considering the risks, benefits, and limitations, Pamela Ray provided informed consent to pursue genetic testing and the blood  sample was sent to [facility] for analysis of the CancerNext-Expanded +RNAinsight Panels. Results should be available within approximately 3 weeks' time, at which point they will be disclosed by telephone to Pamela Ray, as will any additional recommendations warranted by these results. Pamela Ray will receive a summary of her genetic counseling visit and a copy of her results once available. This information will also be available in Epic.    Lastly, we encouraged Pamela Ray to remain in contact with cancer genetics annually so that we can continuously update the family history and inform her of any changes in cancer genetics and testing that may be of benefit for this family.   Pamela Ray questions were answered to her satisfaction today. Our contact information was provided should additional questions or concerns arise. Thank you for the referral and allowing [place] to share in the care of your patient.   Shellyann Wandrey M. Joette Catching, [place], [facility] Genetic Counselor [email or web address] (P) [phone]  The patient was seen for a total of 40  minutes in face-to-face genetic counseling.  The patient was seen alone.  Drs. Lindi Adie and/or Burr Medico were available to discuss this case as needed.   _______________________________________________________________________ For Office Staff:  Number of people involved in session: 1 Was an Intern/ student involved with case: yes; Retail banker, Lorenza Burton

## 2021-10-08 ENCOUNTER — Encounter: Payer: Self-pay | Admitting: Genetic Counselor

## 2021-10-08 ENCOUNTER — Telehealth: Payer: Self-pay | Admitting: Genetic Counselor

## 2021-10-08 DIAGNOSIS — Z1379 Encounter for other screening for genetic and chromosomal anomalies: Secondary | ICD-10-CM | POA: Insufficient documentation

## 2021-10-08 NOTE — Telephone Encounter (Signed)
Contacted patient in attempt to disclose results of genetic testing.  LVM with contact information requesting a call back.  

## 2021-10-09 ENCOUNTER — Ambulatory Visit: Payer: Self-pay | Admitting: Genetic Counselor

## 2021-10-09 ENCOUNTER — Encounter: Payer: Self-pay | Admitting: Genetic Counselor

## 2021-10-09 DIAGNOSIS — Z8049 Family history of malignant neoplasm of other genital organs: Secondary | ICD-10-CM

## 2021-10-09 DIAGNOSIS — Z803 Family history of malignant neoplasm of breast: Secondary | ICD-10-CM

## 2021-10-09 DIAGNOSIS — Z853 Personal history of malignant neoplasm of breast: Secondary | ICD-10-CM

## 2021-10-09 DIAGNOSIS — Z1379 Encounter for other screening for genetic and chromosomal anomalies: Secondary | ICD-10-CM

## 2021-10-09 NOTE — Telephone Encounter (Signed)
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  No changes in management based on result. Discussed that we do not know why she had breast cancer or why there is cancer in the family. It could be sporadic/familial, due to a different gene that we are not testing, or maybe our current technology may not be able to pick something up.  It will be important for her to keep in contact with genetics to keep up with whether additional testing may be needed.

## 2021-10-09 NOTE — Progress Notes (Signed)
HPI: Ms. Keenum was previously seen in the [facility] clinic due to a family of cancer and concerns regarding a hereditary predisposition to cancer. Please refer to our prior cancer genetics clinic note for more information regarding Ms. Lemonds's medical, social and family histories, and our assessment and recommendations, at the time. Ms. Falconi recent genetic test results were disclosed to her, as were recommendations warranted by these results. These results and recommendations are discussed in more detail below.   FAMILY HISTORY:  We obtained a detailed, 4-generation family history.  Significant diagnoses are listed below:      Family History  Problem Relation Age of Onset   Lung cancer Mother          small cell; d. 75   Skin cancer Father          ? BCC; surgery only   Breast cancer Paternal Aunt          dx mid 82s   Uterine cancer Paternal Grandmother          or other GYN cancer; dx unknown age   Ovarian cancer Other          PGM's sister; d. early 8s      Ms. Coard is unaware of previous family history of genetic testing for hereditary cancer risks. Patient's maternal ancestors are of [place], [place], and [place] descent, and paternal ancestors are of [place] and [place] descent. There is no reported Ashkenazi Jewish ancestry. There is no known consanguinity.    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  Since Ms. Ringle has only one pathogenic mutation in MUTYH, she is NOT affected with MYH-associated polyposis, but instead is a carrier.   No other pathogenic variants were identified in the [facility] +RNAinsight Panel.  Report date is October 05, 2021.  The CancerNext-Expanded gene panel offered by [facility] and includes sequencing, rearrangement, and RNA analysis for the following 77 genes: AIP, ALK, APC, ATM, AXIN2, BAP1, BARD1, BLM, BMPR1A, BRCA1, BRCA2, BRIP1,  CDC73, CDH1, CDK4, CDKN1B, CDKN2A, CHEK2, CTNNA1, DICER1, FANCC, FH, FLCN, GALNT12, KIF1B, LZTR1, MAX, MEN1, MET, MLH1, MSH2, MSH3, MSH6, MUTYH, NBN, NF1, NF2, NTHL1, PALB2, PHOX2B, PMS2, POT1, PRKAR1A, PTCH1, PTEN, RAD51C, RAD51D, RB1, RECQL, RET, SDHA, SDHAF2, SDHB, SDHC, SDHD, SMAD4, SMARCA4, SMARCB1, SMARCE1, STK11, SUFU, TMEM127, TP53, TSC1, TSC2, VHL and XRCC2 (sequencing and deletion/duplication); EGFR, EGLN1, HOXB13, KIT, MITF, PDGFRA, POLD1, and POLE (sequencing only); EPCAM and GREM1 (deletion/duplication only).   A copy of the test report has been scanned into Epic and is located under the Molecular Pathology section of the Results Review tab.     Of note, this result does not explain the personal or family history of breast cancer.  Even though a pathogenic variant related to breast cancer was not identified, possible explanations for the cancer in the family may include: The cancers in Ms. Ribble and/or her family may not be hereditary but rather sporadic/familial or due to other genetic and environmental factors. There may be a gene mutation in one of these genes that current testing methods cannot detect but that chance is small. There could be another gene that has not yet been discovered, or that we have not yet tested, that is responsible for the cancer diagnoses in the family.  It is also possible there is a hereditary cause for the cancer in the family that Ms. Rey did not inherit.  Therefore, it is important to  remain in touch with cancer genetics in the future so that we can continue to offer Ms. Barz the most up to date genetic testing.    MUTYH SCREENING RECOMMENDATIONS: We discussed the implications of a heterozygous MUTYH mutation for Ms. Stockinger, and discussed who else in the family should have genetic testing. We recommended Ms. Hable follow the most updated medical management guidelines (NCCN Guidelines v2.2022) for heterozygous MUTYH mutations; all of which are outlined  below.   Personal history of colon cancer  Follow instructions provided by your physician based on your personal history.  Do not have a personal history of colon cancer but have a parent/sibling/child with colon cancer: Colonoscopy every 5 years starting at age 1 or 17 years younger than the earliest age of onset, whichever is younger.  Do not have a personal history of colon cancer but do not have a parent/sibling/child with colon cancer: Data is uncertain on whether specialized screening is warranted.  Because Ms. Kasparek does not have a personal or family history of colon cancer at this time, no management or screening changes are recommended at this time.   FAMILY MEMBERS: Since we now know the mutation in Ms. Eisen, we can test relatives to determine their MUTYH status. We will be happy to meet with any of the family members or refer them to a genetic counselor in their local area. To locate genetic counselors in other cities, individuals can visit the website of the [employer] ([email or web address].[email or web address]) and Secretary/administrator for a Social worker by zip code.   Individuals in this family might be at some increased risk of developing cancer, over the general population risk, due to the family history of breast cancer.  Individuals in the family should notify their providers of the family history of cancer. We recommend women in this family have a yearly mammogram beginning at age 44, or 75 years younger than the earliest onset of cancer, an annual clinical breast exam, and perform monthly breast self-exams.    We strongly encouraged Ms. Joe to remain in contact with [place] in cancer genetics on an annual basis so we can update Ms. Monceaux's personal and family histories, and inform her of advances in cancer genetics that may be of benefit for the entire family. Ms. Zumbro knows she is also welcome to call with any questions or concerns, at any time.   Blong Busk M. Joette Catching, [place], [facility] Genetic  Counselor [email or web address] (P) [phone]

## 2021-10-11 ENCOUNTER — Ambulatory Visit: Payer: 59 | Admitting: Psychology

## 2021-10-25 ENCOUNTER — Ambulatory Visit: Payer: 59 | Admitting: Psychology

## 2021-11-08 ENCOUNTER — Ambulatory Visit: Payer: 59 | Admitting: Psychology

## 2021-11-22 ENCOUNTER — Ambulatory Visit (INDEPENDENT_AMBULATORY_CARE_PROVIDER_SITE_OTHER): Payer: 59 | Admitting: Psychology

## 2021-11-22 ENCOUNTER — Ambulatory Visit: Payer: 59 | Admitting: Psychology

## 2021-11-22 DIAGNOSIS — F411 Generalized anxiety disorder: Secondary | ICD-10-CM

## 2021-11-22 NOTE — Progress Notes (Signed)
? ? ? ? ? ? ? ? ? ? ? ? ? ? 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 (Status: maintained) -- No Description Entered  ?Excessive and/or unrealistic worry that is difficult to control occurring more days than not for at least 6 months about a number of events or activities. (Status: maintained) -- No Description Entered  ?Feelings of hopelessness, worthlessness, or inappropriate guilt. (Status: maintained) -- No Description Entered  ?Medication Status ?compliance  ?Safety ?none  ?If Suicidal or Homicidal State Action Taken: unspecified  ?Current Risk: low ?Medications ?unspecified ?Objectives ?Related Problem: Develop healthy thinking patterns and beliefs about self, others, and the world that lead to the alleviation and help prevent the relapse of depression. ?Description: Learn and implement conflict resolution skills to resolve interpersonal problems. ?Target Date: 202312-17 ?Frequency: Daily ?Modality: individual ?Progress: 70% ? ?Related Problem: Develop healthy thinking patterns and beliefs about self, others, and the world that lead to the alleviation and help prevent the relapse of depression. ?Description: Learn and implement problem-solving and decision-making skills. ?Target Date: 2022-08-04 ?Frequency: Daily ?Modality: individual ?Progress: 80%  ?Related Problem: Develop healthy thinking patterns and beliefs about self, others, and the world that lead to the alleviation and help prevent the relapse of depression. ?Description: Learn and implement behavioral strategies to overcome depression. ?Target Date: 2022-08-04 ?Frequency: Daily ?Modality: individual ?Progress: 70%  ?Related Problem: Develop healthy thinking patterns and beliefs about self, others, and the world that lead to the alleviation and help prevent the relapse of  depression. ?Description: Identify and replace thoughts and beliefs that support depression. ?Target Date: 2022-08-04 ?Frequency: Daily ?Modality: individual ?Progress: 80%  ?Related Problem: Develop healthy thinking patterns and beliefs about self, others, and the world that lead to the alleviation and help prevent the relapse of depression. ?Description: Describe current and past experiences with depression including their impact on functioning and attempts to resolve it. ?Target Date: 2022-08-04 ?Frequency: Daily ?Modality: individual ?Progress: 90%  ?Related Problem: Reduce overall frequency, intensity, and duration of the anxiety so that daily functioning is not impaired. ?Description: Maintain involvement in work, family, and social activities. ?Target Date: 2022-08-04 ?Frequency: Daily ?Modality: individual ?Progress: 90%  ?Related Problem: Reduce overall frequency, intensity, and duration of the anxiety so that daily functioning is not impaired. ?Description: Learn to accept limitations in life and commit to tolerating, rather than avoiding, unpleasant emotions while accomplishing meaningful goals. ?Target Date: 2022-08-04 ?Frequency: Daily ?Modality: individual ?Progress: 80%  ?Related Problem: Reduce overall frequency, intensity, and duration of the anxiety so that daily functioning is not impaired. ?Description: Learn and implement problem-solving strategies for realistically addressing worries. ?Target Date: 2022-08-04 ?Frequency: Daily ?Modality: individual ?Progress: 80%  ?Related Problem: Reduce overall frequency, intensity, and duration of the anxiety so that daily functioning is not impaired. ?Description: Identify, challenge, and replace biased, fearful self-talk with positive, realistic, and empowering self-talk. ?Target Date: 2022-08-04 ?Frequency: Daily ?Modality: individual ?Progress: 80%  ?Related Problem: Reduce overall frequency, intensity, and duration of the anxiety so that daily  functioning is not impaired. ?Description: Describe situations, thoughts, feelings, and actions associated with anxieties and worries, their impact on functioning, and attempts to resolve them. ?Target Date: 2022-08-04 ?Frequency: Daily ?Modality: individual ?Progress: 90%  ?Client Response ?full compliance  ?Service Location ?Location, [address] [place], [place] [address]  ?Service Code ?cpt [id number]  ?Lifestyle change (exercise, nutrition)  ?Self-monitoring  ?Normalize/Reframe  ?Facilitate  problem solving  ?Identify/label emotions  ?Validate/empathize  ?Emotion regulation skills  ?Self care activities  ?Related past to present  ?Session Notes ?F33.2, F41.1.  ?Goals: Resolve grief related to the loss of her mother, foster independence, resolve guilt, manage unresolved family of origin issues, resolve feelings associated with failed marriage, address issues related to establishing new romantic relationship. Also, has some conflicts with her sons that are not resolved. Goal Date is 12-23.  ?Patient agrees to a virtual video session due to the Coronavirus pandemic. She is at home and I am in my home office.  ?Pamela Ray states that her son Pamela Ray told her he is trans. He has started taking hormones. In the past, he had shared with Pamela Ray that he was bisexual, but also said he had some confusion and was undecided. The on March 31, she and Pamela Ray went out to dinner together. He told her, at that time, that he started HRT. He then opened up with her about his autism, and trauma from his father. Pamela Ray says she raised him with a clear understanding that she would love him regardless of his sexual choices. She says she is completely surprised and does not understand how he came to this decision. She reinforced to him that she will love him no matter his decision. She appealed to him, however, to slow down. ?Pamela Ray has had "episodes" of rage with Pamela Ray when he greatly over-reacted. Likely related to his hormone replacement. We  discussed the need for Pamela Ray to mourn the "loss of what was". Pamela Ray still has a lot of work to do to grasp what all of this means for her son and for herself. Discussed strategies to talk with Pamela Ray.     ?[name], PhD Time 3:10p-4:00p 50 min.    ? ? ? ? ? ? ? ? ? ? ? ? ? ? ?

## 2021-12-06 ENCOUNTER — Ambulatory Visit: Payer: 59 | Admitting: Psychology

## 2021-12-07 ENCOUNTER — Encounter: Payer: Self-pay | Admitting: Family

## 2021-12-07 ENCOUNTER — Inpatient Hospital Stay (HOSPITAL_BASED_OUTPATIENT_CLINIC_OR_DEPARTMENT_OTHER): Payer: 59 | Admitting: Family

## 2021-12-07 ENCOUNTER — Inpatient Hospital Stay: Payer: 59 | Attending: Hematology and Oncology

## 2021-12-07 VITALS — BP 113/77 | HR 79 | Temp 98.0°F | Resp 17 | Wt 141.4 lb

## 2021-12-07 DIAGNOSIS — D509 Iron deficiency anemia, unspecified: Secondary | ICD-10-CM

## 2021-12-07 DIAGNOSIS — D0512 Intraductal carcinoma in situ of left breast: Secondary | ICD-10-CM

## 2021-12-07 DIAGNOSIS — E559 Vitamin D deficiency, unspecified: Secondary | ICD-10-CM

## 2021-12-07 LAB — CMP (CANCER CENTER ONLY)
ALT: 16 U/L (ref 0–44)
AST: 18 U/L (ref 15–41)
Albumin: 4.4 g/dL (ref 3.5–5.0)
Alkaline Phosphatase: 68 U/L (ref 38–126)
Anion gap: 5 (ref 5–15)
BUN: 13 mg/dL (ref 6–20)
CO2: 32 mmol/L (ref 22–32)
Calcium: 10.1 mg/dL (ref 8.9–10.3)
Chloride: 104 mmol/L (ref 98–111)
Creatinine: 0.65 mg/dL (ref 0.44–1.00)
GFR, Estimated: >60 mL/min (ref >60)
Glucose, Bld: 130 mg/dL — ABNORMAL HIGH (ref 70–99)
Potassium: 3.9 mmol/L (ref 3.5–5.1)
Sodium: 141 mmol/L (ref 135–145)
Total Bilirubin: 0.5 mg/dL (ref 0.3–1.2)
Total Protein: 7 g/dL (ref 6.5–8.1)

## 2021-12-07 LAB — CBC WITH DIFFERENTIAL (CANCER CENTER ONLY)
Abs Immature Granulocytes: 0.01 10*3/uL (ref 0.00–0.07)
Basophils Absolute: 0 10*3/uL (ref 0.0–0.1)
Basophils Relative: 1 %
Eosinophils Absolute: 0.1 10*3/uL (ref 0.0–0.5)
Eosinophils Relative: 3 %
HCT: 36.8 % (ref 36.0–46.0)
Hemoglobin: 11.7 g/dL — ABNORMAL LOW (ref 12.0–15.0)
Immature Granulocytes: 0 %
Lymphocytes Relative: 39 %
Lymphs Abs: 2.1 10*3/uL (ref 0.7–4.0)
MCH: 25.7 pg — ABNORMAL LOW (ref 26.0–34.0)
MCHC: 31.8 g/dL (ref 30.0–36.0)
MCV: 80.7 fL (ref 80.0–100.0)
Monocytes Absolute: 0.4 10*3/uL (ref 0.1–1.0)
Monocytes Relative: 8 %
Neutro Abs: 2.6 10*3/uL (ref 1.7–7.7)
Neutrophils Relative %: 49 %
Platelet Count: 280 10*3/uL (ref 150–400)
RBC: 4.56 MIL/uL (ref 3.87–5.11)
RDW: 14.1 % (ref 11.5–15.5)
WBC Count: 5.3 10*3/uL (ref 4.0–10.5)
nRBC: 0 % (ref 0.0–0.2)

## 2021-12-07 LAB — FERRITIN: Ferritin: 8 ng/mL — ABNORMAL LOW (ref 11–307)

## 2021-12-07 NOTE — Progress Notes (Signed)
?Hematology and Oncology Follow Up Visit ? ?Pamela Ray ?[id number] ?1966/02/06 57 y.o. ?12/07/2021 ? ? ?Principle Diagnosis:  ?DCIS of the left breast ?Recurrent iron deficiency anemia ?Vitamin D deficeincy ?  ?Current Therapy:        ?Fareston '60mg'$  po q day - will complete 10 years in 2024 ?IV iron as indicated - Injectafer (reacted to [facility]) ?Vitamin D 50,000 units PO weekly  ?  ?Interim History:  Pamela Ray is here today for follow-up. She is feeling fatigued.  ?She states that since having the uterine polyp removed and D&C she had heavy bleeding on March 5th. Her gynecologist is following this and states it may be due to menopause.  ?She is still spotting occasionally. No other blood loss noted. No bruising or petechiae.  ?She states that her recent colonoscopy was negative.  ?No fever, chills, n/v, cough, rash, dizziness, SOB, palpitations, abdominal pain or changes in bowel or bladder habits at this time.  ?No swelling, numbness or tingling in her extremities.  ?No falls or syncope.  ?She has been eating healthy and staying well hydrated. Her weight is stable at 141 lbs.  ? ?ECOG Performance Status: 1 - Symptomatic but completely ambulatory ? ?Medications:  ?Allergies as of 12/07/2021   ? ?   Reactions  ? Amoxicillin Itching  ? Cephalexin Itching  ? Penicillins Hives  ? ?  ? ?  ?Medication List  ?  ? ?  ? Accurate as of December 07, 2021  2:47 PM. If you have any questions, ask your nurse or doctor.  ?  ?  ? ?  ? ?ALPRAZolam 0.25 MG tablet ?Commonly known as: Duanne Moron ?Take 1 tablet (0.25 mg total) by mouth 3 (three) times daily as needed. for anxiety ?  ?Boric Acid Theodoro Parma ?Insert 1 capsule in vagina every night x 14 days then every Monday and Thursday night indefinitely ?  ?Botulinum Toxin Type A (Cosm) 100 units Solr ?Every 3 months ?  ?chlorproMAZINE 25 MG tablet ?Commonly known as: THORAZINE ?  ?hydrocortisone 2.5 % ointment ?  ?ibuprofen 200 MG tablet ?Commonly known as: ADVIL ?Take 200 mg by mouth every 6 (six)  hours as needed. ?  ?SUMAtriptan 100 MG tablet ?Commonly known as: IMITREX ?Take by mouth. ?  ?Vitamin D (Ergocalciferol) 1.25 MG (50000 UNIT) Caps capsule ?Commonly known as: DRISDOL ?TAKE 1 CAPSULE BY MOUTH  ONCE WEEKLY ?  ? ?  ? ? ?Allergies:  ?Allergies  ?Allergen Reactions  ? Amoxicillin Itching  ? Cephalexin Itching  ? Penicillins Hives  ? ? ?Past Medical History, Surgical history, Social history, and Family History were reviewed and updated. ? ?Review of Systems: ?All other 10 point review of systems is negative.  ? ?Physical Exam: ? weight is 141 lb 6.4 oz (64.1 kg). Her oral temperature is 98 ?F (36.7 ?C). Her blood pressure is 113/77 and her pulse is 79. Her respiration is 17 and oxygen saturation is 100%.  ? ?Wt Readings from Last 3 Encounters:  ?12/07/21 141 lb 6.4 oz (64.1 kg)  ?06/08/21 137 lb 1.9 oz (62.2 kg)  ?12/08/20 137 lb 1.9 oz (62.2 kg)  ? ? ?Ocular: Sclerae unicteric, pupils equal, round and reactive to light ?Ear-nose-throat: Oropharynx clear, dentition fair ?Lymphatic: No cervical or supraclavicular adenopathy ?Lungs no rales or rhonchi, good excursion bilaterally ?Heart regular rate and rhythm, no murmur appreciated ?Abd soft, nontender, positive bowel sounds ?MSK no focal spinal tenderness, no joint edema ?Neuro: non-focal, well-oriented, appropriate affect ?Breasts: Deferred  ? ?Lab  Results  ?Component Value Date  ? WBC 5.3 12/07/2021  ? HGB 11.7 (L) 12/07/2021  ? HCT 36.8 12/07/2021  ? MCV 80.7 12/07/2021  ? PLT 280 12/07/2021  ? ?Lab Results  ?Component Value Date  ? FERRITIN 6 (L) 06/08/2021  ? IRON 16 (L) 06/08/2021  ? TIBC 543 (H) 06/08/2021  ? UIBC 527 06/08/2021  ? IRONPCTSAT 3 (L) 06/08/2021  ? ?Lab Results  ?Component Value Date  ? RETICCTPCT 1.0 05/12/2015  ? RBC 4.56 12/07/2021  ? RETICCTABS 48.9 05/12/2015  ? ?No results found for: KPAFRELGTCHN, LAMBDASER, KAPLAMBRATIO ?No results found for: IGGSERUM, IGA, IGMSERUM ?No results found for: TOTALPROTELP, ALBUMINELP, A1GS, A2GS,  BETS, BETA2SER, GAMS, MSPIKE, SPEI ?  Chemistry   ?   ?Component Value Date/Time  ? NA 142 06/08/2021 1408  ? NA 141 07/25/2017 1131  ? NA 140 12/04/2016 0747  ? K 4.7 06/08/2021 1408  ? K 3.9 07/25/2017 1131  ? K 4.2 12/04/2016 0747  ? CL 106 06/08/2021 1408  ? CL 108 07/25/2017 1131  ? CO2 31 06/08/2021 1408  ? CO2 27 07/25/2017 1131  ? CO2 25 12/04/2016 0747  ? BUN 15 06/08/2021 1408  ? BUN 12 07/25/2017 1131  ? BUN 13.2 12/04/2016 0747  ? CREATININE 0.60 06/08/2021 1408  ? CREATININE 0.7 07/25/2017 1131  ? CREATININE 0.7 12/04/2016 0747  ?    ?Component Value Date/Time  ? CALCIUM 10.1 06/08/2021 1408  ? CALCIUM 9.4 07/25/2017 1131  ? CALCIUM 9.3 12/04/2016 0747  ? ALKPHOS 63 06/08/2021 1408  ? ALKPHOS 65 07/25/2017 1131  ? ALKPHOS 64 12/04/2016 0747  ? AST 16 06/08/2021 1408  ? AST 17 12/04/2016 0747  ? ALT 9 06/08/2021 1408  ? ALT 20 07/25/2017 1131  ? ALT 10 12/04/2016 0747  ? BILITOT 0.3 06/08/2021 1408  ? BILITOT 0.73 12/04/2016 0747  ?  ? ? ? ?Impression and Plan: Pamela Ray is a very pleasant 56 yo caucasian female with 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 She had a lumpectomy followed by radiation and is now on Fareston. She will complete a full 10 years of Fareston (2024).  ?Iron studies are pending.  ?Follow-up in 6 months.  ? ?[name], NP ?4/21/20232:47 PM ? ?

## 2021-12-10 ENCOUNTER — Other Ambulatory Visit: Payer: Self-pay | Admitting: Family

## 2021-12-10 LAB — IRON AND IRON BINDING CAPACITY (CC-WL,HP ONLY)
Iron: 42 ug/dL (ref 28–170)
Saturation Ratios: 8 % — ABNORMAL LOW (ref 10.4–31.8)
TIBC: 535 ug/dL — ABNORMAL HIGH (ref 250–450)
UIBC: 493 ug/dL — ABNORMAL HIGH (ref 148–442)

## 2021-12-14 ENCOUNTER — Other Ambulatory Visit: Payer: Self-pay | Admitting: Family

## 2021-12-14 ENCOUNTER — Inpatient Hospital Stay: Payer: 59

## 2021-12-14 VITALS — BP 118/72 | HR 80 | Temp 98.1°F | Resp 17

## 2021-12-14 DIAGNOSIS — D508 Other iron deficiency anemias: Secondary | ICD-10-CM

## 2021-12-14 DIAGNOSIS — D0512 Intraductal carcinoma in situ of left breast: Secondary | ICD-10-CM | POA: Diagnosis not present

## 2021-12-14 MED ORDER — SODIUM CHLORIDE 0.9 % IV SOLN: INTRAVENOUS | Status: DC

## 2021-12-14 MED ORDER — HEPARIN SOD (PORK) LOCK FLUSH 100 UNIT/ML IV SOLN: 500.0000 [IU] | Freq: Once | INTRAVENOUS | Status: DC | PRN

## 2021-12-14 MED ORDER — ALTEPLASE 2 MG IJ SOLR: 2.0000 mg | Freq: Once | INTRAMUSCULAR | Status: DC | PRN

## 2021-12-14 MED ORDER — HEPARIN SOD (PORK) LOCK FLUSH 100 UNIT/ML IV SOLN: 250.0000 [IU] | Freq: Once | INTRAVENOUS | Status: DC | PRN

## 2021-12-14 MED ORDER — SODIUM CHLORIDE 0.9% FLUSH: 3.0000 mL | Freq: Once | INTRAVENOUS | Status: DC | PRN

## 2021-12-14 MED ORDER — SODIUM CHLORIDE 0.9% FLUSH: 10.0000 mL | Freq: Once | INTRAVENOUS | Status: DC | PRN

## 2021-12-14 MED ORDER — ACETAMINOPHEN 325 MG PO TABS
650.0000 mg | ORAL_TABLET | Freq: Once | ORAL | Status: AC
  Administered 2021-12-14: 650 mg via ORAL
  Filled 2021-12-14: qty 2

## 2021-12-14 MED ORDER — DIPHENHYDRAMINE HCL 50 MG/ML IJ SOLN
50.0000 mg | Freq: Once | INTRAMUSCULAR | Status: AC | PRN
  Administered 2021-12-14: 25 mg via INTRAVENOUS

## 2021-12-14 MED ORDER — SODIUM CHLORIDE 0.9 % IV SOLN
300.0000 mg | Freq: Once | INTRAVENOUS | Status: AC
  Administered 2021-12-14: 300 mg via INTRAVENOUS
  Filled 2021-12-14: qty 300

## 2021-12-14 NOTE — Progress Notes (Signed)
Hypersensitivity Reaction note ? ?Date of event: 12/14/21 ?Time of event: 1330 ?Generic name of drug involved: venofer ?Name of provider notified of the hypersensitivity reaction: [name], NP ?Was agent that likely caused hypersensitivity reaction added to Allergies List within EMR? no ?Chain of events including reaction signs/symptoms, treatment administered, and outcome (e.g., drug resumed; drug discontinued; sent to Emergency Department; etc.) Pt stated she was starting to feel "weird" Pt c/o feeling hot all over with dizziness. Pt stated she felt flush and as if her heart was racing. Iron infusion stopped and normal saline started to patent PIV. [name] NP at bedside to evaluate pt. Vitals as charted. Pt medicated per orders; refer to [facility]. ?1340: Pt states she feels much better. Pt aware of plan to restart venofer at slower rate. ?6237: Pt resting in seat with eyes closed. Respirations equal and unlabored. Skin warm and dry. Iron infusion re-started at slower rate per orders. ?Shelda Altes, RN ?12/14/2021 1:35 PM ? ?

## 2021-12-14 NOTE — Progress Notes (Signed)
Dose of Venofer changed from 300 mg to 200 mg due to tachycardia, palpitations, dizziness and leg cramping. She also received benadryl IV and tylenol.  ?

## 2021-12-14 NOTE — Progress Notes (Signed)
Patient discharged.  Feels good.  VSS.  No symptoms stated ?

## 2021-12-14 NOTE — Patient Instructions (Signed)

## 2021-12-17 NOTE — Progress Notes (Signed)
Patient experienced a reaction during Venofer infusion on 4/28. ?Symptoms resolved with acetaminophen and diphenhydramine. ?These have been added as premedications for future cycles per Dr. Antonieta Pert instructions. ?

## 2021-12-20 ENCOUNTER — Ambulatory Visit (INDEPENDENT_AMBULATORY_CARE_PROVIDER_SITE_OTHER): Payer: 59 | Admitting: Psychology

## 2021-12-20 DIAGNOSIS — F411 Generalized anxiety disorder: Secondary | ICD-10-CM | POA: Diagnosis not present

## 2021-12-20 NOTE — Progress Notes (Addendum)
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 (Status: maintained) -- No Description Entered  Excessive and/or unrealistic worry that is difficult to control occurring more days than not for at least 6 months about a number of events or activities. (Status: maintained) -- No Description Entered  Feelings of hopelessness, worthlessness, or inappropriate guilt. (Status: maintained) -- No Description Entered  Medication Status compliance  Safety none  If Suicidal or Homicidal State Action Taken: unspecified  Current Risk: low Medications unspecified Objectives Related Problem: Develop healthy thinking patterns and beliefs about self, others, and the world that lead to the alleviation and help prevent the relapse of depression. Description: Learn and implement conflict resolution skills to resolve interpersonal problems. Target Date: 202312-17 Frequency: Daily Modality: individual Progress: 70%  Related Problem: Develop healthy thinking patterns and beliefs about self, others, and the world that lead to the alleviation and help prevent the relapse of depression. Description: Learn and implement problem-solving and decision-making skills. Target Date: 2022-08-04 Frequency: Daily Modality: individual Progress: 80%  Related Problem: Develop healthy thinking patterns and beliefs about self, others, and the world that lead to the alleviation and help prevent the relapse of depression. Description: Learn and implement behavioral strategies to overcome depression. Target Date: 2022-08-04 Frequency: Daily Modality: individual Progress: 70%  Related Problem: Develop healthy thinking patterns and beliefs about self, others, and the world that lead to the alleviation and help prevent the  relapse of depression. Description: Identify and replace thoughts and beliefs that support depression. Target Date: 2022-08-04 Frequency: Daily Modality: individual Progress: 80%  Related Problem: Develop healthy thinking patterns and beliefs about self, others, and the world that lead to the alleviation and help prevent the relapse of depression. Description: Describe current and past experiences with depression including their impact on functioning and attempts to resolve it. Target Date: 2022-08-04 Frequency: Daily Modality: individual Progress: 90%  Related Problem: Reduce overall frequency, intensity, and duration of the anxiety so that daily functioning is not impaired. Description: Maintain involvement in work, family, and social activities. Target Date: 2022-08-04 Frequency: Daily Modality: individual Progress: 90%  Related Problem: Reduce overall frequency, intensity, and duration of the anxiety so that daily functioning is not impaired. Description: Learn to accept limitations in life and commit to tolerating, rather than avoiding, unpleasant emotions while accomplishing meaningful goals. Target Date: 2022-08-04 Frequency: Daily Modality: individual Progress: 80%  Related Problem: Reduce overall frequency, intensity, and duration of the anxiety so that daily functioning is not impaired. Description: Learn and implement problem-solving strategies for realistically addressing worries. Target Date: 2022-08-04 Frequency: Daily Modality: individual Progress: 80%  Related Problem: Reduce overall frequency, intensity, and duration of the anxiety so that daily functioning is not impaired. Description: Identify, challenge, and replace biased, fearful self-talk with positive, realistic, and empowering self-talk. Target Date: 2022-08-04 Frequency: Daily Modality: individual Progress: 80%  Related Problem: Reduce overall frequency, intensity, and duration of the anxiety so that  daily functioning is not impaired. Description: Describe situations, thoughts, feelings, and actions associated with anxieties and worries, their impact on functioning, and attempts to resolve them. Target Date: 2022-08-04 Frequency: Daily Modality: individual Progress: 90%  Client Response full compliance  Service Location Location, [address] [place], [place] [address]  Service Code cpt [phone]  Lifestyle change (exercise, nutrition)  Self-monitoring  Normalize/Reframe  Facilitate problem solving  Identify/label emotions  Validate/empathize  Emotion regulation skills  Self care activities  Related past to present  Session Notes F33.2, F41.1.  Goals: Resolve grief related to the loss of her mother, foster independence, resolve guilt, manage unresolved family of origin issues, resolve feelings associated with failed marriage, address issues related to establishing new romantic relationship. Also, has some conflicts with her sons that are not resolved. Goal Date is 12-23.  Patient agrees to a video phone session due to the Coronavirus pandemic. She is at home and I am in my home office.  Pamela Ray states that she had a conversation with Pamela Ray about starting a "female dominated job" given he is transitioning. He shared with her that she causes him anxiety and that he feels she is not always supportive of him.  Pamela Ray is experiencing considerable stress because she got a job offer that she is going to take and has to leave her current job that she loves. She feels guilty about leaving the company, but the offer is very strong. She has not notified current employer and dreads what will follow. We discussed putting this all into perspective and how to manage her feelings.       [name], PhD Time 5:15p-6:00p 45 min.

## 2021-12-21 ENCOUNTER — Inpatient Hospital Stay: Payer: 59 | Attending: Hematology and Oncology

## 2021-12-21 VITALS — BP 113/66 | HR 81 | Temp 97.8°F | Resp 17

## 2021-12-21 DIAGNOSIS — D509 Iron deficiency anemia, unspecified: Secondary | ICD-10-CM | POA: Insufficient documentation

## 2021-12-21 DIAGNOSIS — D508 Other iron deficiency anemias: Secondary | ICD-10-CM

## 2021-12-21 MED ORDER — SODIUM CHLORIDE 0.9 % IV SOLN: INTRAVENOUS | Status: DC

## 2021-12-21 MED ORDER — SODIUM CHLORIDE 0.9 % IV SOLN
200.0000 mg | Freq: Once | INTRAVENOUS | Status: AC
  Administered 2021-12-21: 200 mg via INTRAVENOUS
  Filled 2021-12-21: qty 10

## 2021-12-21 MED ORDER — ACETAMINOPHEN 325 MG PO TABS
650.0000 mg | ORAL_TABLET | Freq: Once | ORAL | Status: AC
  Administered 2021-12-21: 650 mg via ORAL
  Filled 2021-12-21: qty 2

## 2021-12-21 MED ORDER — DIPHENHYDRAMINE HCL 25 MG PO CAPS
50.0000 mg | ORAL_CAPSULE | Freq: Once | ORAL | Status: AC
  Administered 2021-12-21: 50 mg via ORAL
  Filled 2021-12-21: qty 2

## 2021-12-21 NOTE — Patient Instructions (Signed)

## 2021-12-24 ENCOUNTER — Other Ambulatory Visit: Payer: Self-pay | Admitting: Family

## 2021-12-28 ENCOUNTER — Inpatient Hospital Stay: Payer: 59

## 2021-12-28 VITALS — BP 113/71 | HR 85 | Temp 97.9°F | Resp 18

## 2021-12-28 DIAGNOSIS — D508 Other iron deficiency anemias: Secondary | ICD-10-CM

## 2021-12-28 DIAGNOSIS — D509 Iron deficiency anemia, unspecified: Secondary | ICD-10-CM | POA: Diagnosis not present

## 2021-12-28 MED ORDER — DIPHENHYDRAMINE HCL 25 MG PO CAPS
50.0000 mg | ORAL_CAPSULE | Freq: Once | ORAL | Status: AC
  Administered 2021-12-28: 25 mg via ORAL
  Filled 2021-12-28: qty 2

## 2021-12-28 MED ORDER — ACETAMINOPHEN 325 MG PO TABS
650.0000 mg | ORAL_TABLET | Freq: Once | ORAL | Status: AC
  Administered 2021-12-28: 650 mg via ORAL
  Filled 2021-12-28: qty 2

## 2021-12-28 MED ORDER — SODIUM CHLORIDE 0.9 % IV SOLN
200.0000 mg | Freq: Once | INTRAVENOUS | Status: AC
  Administered 2021-12-28: 200 mg via INTRAVENOUS
  Filled 2021-12-28: qty 10

## 2021-12-28 NOTE — Patient Instructions (Signed)

## 2021-12-31 ENCOUNTER — Ambulatory Visit (INDEPENDENT_AMBULATORY_CARE_PROVIDER_SITE_OTHER): Payer: 59 | Admitting: Psychology

## 2021-12-31 DIAGNOSIS — F411 Generalized anxiety disorder: Secondary | ICD-10-CM

## 2021-12-31 NOTE — Progress Notes (Addendum)
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 (Status: maintained) -- No Description Entered  Excessive and/or unrealistic worry that is difficult to control occurring more days than not for at least 6 months about a number of events or activities. (Status: maintained) -- No Description Entered  Feelings of hopelessness, worthlessness, or inappropriate guilt. (Status: maintained) -- No Description Entered  Medication Status compliance  Safety none  If Suicidal or Homicidal State Action Taken: unspecified  Current Risk: low Medications unspecified Objectives Related Problem: Develop healthy thinking patterns and beliefs about self, others, and the world that lead to the alleviation and help prevent the relapse of depression. Description: Learn and implement conflict resolution skills to resolve interpersonal problems. Target Date: 2022-08-04 Frequency: Daily Modality: individual Progress: 70%  Related Problem: Develop healthy thinking patterns and beliefs about self, others, and the world that lead to the alleviation and help prevent the relapse of depression. Description: Learn and implement problem-solving and decision-making skills. Target Date: 2022-08-04 Frequency: Daily Modality: individual Progress: 80%  Related Problem: Develop healthy thinking patterns and beliefs about self, others, and the world that lead to the alleviation and help prevent the relapse of depression. Description: Learn and implement behavioral strategies to overcome depression. Target Date: 2022-08-04 Frequency: Daily Modality: individual Progress: 70%  Related Problem: Develop healthy thinking patterns and beliefs about self, others, and the world that lead to the alleviation and help prevent the relapse of depression. Description: Identify and  replace thoughts and beliefs that support depression. Target Date: 2022-08-04 Frequency: Daily Modality: individual Progress: 80%  Related Problem: Develop healthy thinking patterns and beliefs about self, others, and the world that lead to the alleviation and help prevent the relapse of depression. Description: Describe current and past experiences with depression including their impact on functioning and attempts to resolve it. Target Date: 2022-08-04 Frequency: Daily Modality: individual Progress: 90%  Related Problem: Reduce overall frequency, intensity, and duration of the anxiety so that daily functioning is not impaired. Description: Maintain involvement in work, family, and social activities. Target Date: 2022-08-04 Frequency: Daily Modality: individual Progress: 90%  Related Problem: Reduce overall frequency, intensity, and duration of the anxiety so that daily functioning is not impaired. Description: Learn to accept limitations in life and commit to tolerating, rather than avoiding, unpleasant emotions while accomplishing meaningful goals. Target Date: 2022-08-04 Frequency: Daily Modality: individual Progress: 80%  Related Problem: Reduce overall frequency, intensity, and duration of the anxiety so that daily functioning is not impaired. Description: Learn and implement problem-solving strategies for realistically addressing worries. Target Date: 2022-08-04 Frequency: Daily Modality: individual Progress: 80%  Related Problem: Reduce overall frequency, intensity, and duration of the anxiety so that daily functioning is not impaired. Description: Identify, challenge, and replace biased, fearful self-talk with positive, realistic, and empowering self-talk. Target Date: 2022-08-04 Frequency: Daily Modality: individual Progress: 80%  Related Problem: Reduce overall frequency, intensity, and duration of the anxiety so that daily functioning is not impaired. Description:  Describe situations, thoughts, feelings, and actions associated with anxieties and worries, their impact on functioning, and attempts to resolve them. Target Date: 2022-08-04 Frequency: Daily Modality: individual Progress: 90%  Client Response full compliance  Service Location Location, [address] [place], [place] [address]  Service Code cpt [phone]  Lifestyle change (exercise, nutrition)  Self-monitoring  Normalize/Reframe  Facilitate problem solving  Identify/label emotions  Validate/empathize  Emotion regulation  skills  Self care activities  Related past to present  Session Notes F33.2, F41.1.  Goals: Resolve grief related to the loss of her mother, foster independence, resolve guilt, manage unresolved family of origin issues, resolve feelings associated with failed marriage, address issues related to establishing new romantic relationship. Also, has some conflicts with her sons that are not resolved. Goal Date is 12-23.  Patient agrees to a video phone session due to the Coronavirus pandemic. She is at home and I am in my home office.  Pamela Ray states that she has been having some extreme migraines. She feels it is likely stress related. She is torn about taking the new job because she has so much guilt. She had started new position April 24th and feels terrible about quitting so soon after taking the new position. She says she can rationalize it and it helps, but still is bothered by the circumstances. We discussed her challenges with work and the strategy she will employ. Continues to struggle with her feelings about Evan's transition. Still trying to understand how this will be perceived by the extended family. 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 He (they) however, say it did not go well initially. He "latched on" to her comments of "I want to support you, but don't rush in to anything that you cannot reverse". They have come to a "better  place", but the situation is still fragile. We discussed strategy moving forward and the need for her to remain supportive, but not "walk on egg shells". Needs more confidence that her instincts are good.        [name], PhD Time 8:40-9:30a 50 min.

## 2022-01-03 ENCOUNTER — Ambulatory Visit: Payer: 59 | Admitting: Psychology

## 2022-01-17 ENCOUNTER — Ambulatory Visit (INDEPENDENT_AMBULATORY_CARE_PROVIDER_SITE_OTHER): Payer: 59 | Admitting: Psychology

## 2022-01-17 DIAGNOSIS — F411 Generalized anxiety disorder: Secondary | ICD-10-CM | POA: Diagnosis not present

## 2022-01-31 ENCOUNTER — Ambulatory Visit (INDEPENDENT_AMBULATORY_CARE_PROVIDER_SITE_OTHER): Payer: 59 | Admitting: Psychology

## 2022-01-31 DIAGNOSIS — F411 Generalized anxiety disorder: Secondary | ICD-10-CM | POA: Diagnosis not present

## 2022-01-31 NOTE — Progress Notes (Signed)
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 (Status: maintained) -- No Description Entered  Excessive and/or unrealistic worry that is difficult to control occurring more days than not for at least 6 months about a number of events or activities. (Status: maintained) -- No Description Entered  Feelings of hopelessness, worthlessness, or inappropriate guilt. (Status: maintained) -- No Description Entered  Medication Status compliance  Safety none  If Suicidal or Homicidal State Action Taken: unspecified  Current Risk: low Medications unspecified Objectives Related Problem: Develop healthy thinking patterns and beliefs about self, others, and the world that lead to the alleviation and help prevent the relapse of depression. Description: Learn and implement conflict resolution skills to resolve interpersonal problems. Target Date: 2022-08-04 Frequency: Daily Modality: individual Progress: 70%  Related Problem: Develop healthy thinking patterns and beliefs about self, others, and the world that lead to the alleviation and help prevent the relapse of depression. Description: Learn and implement problem-solving and decision-making skills. Target Date: 2022-08-04 Frequency: Daily Modality: individual Progress: 80%  Related Problem: Develop healthy thinking patterns and beliefs about self, others, and the world that lead to the alleviation and help prevent the relapse of depression. Description: Learn and implement behavioral strategies to overcome depression. Target Date: 2022-08-04 Frequency: Daily Modality: individual Progress: 70%  Related Problem: Develop healthy thinking patterns and beliefs about self, others, and the world that lead to the alleviation and help prevent the relapse of  depression. Description: Identify and replace thoughts and beliefs that support depression. Target Date: 2022-08-04 Frequency: Daily Modality: individual Progress: 80%  Related Problem: Develop healthy thinking patterns and beliefs about self, others, and the world that lead to the alleviation and help prevent the relapse of depression. Description: Describe current and past experiences with depression including their impact on functioning and attempts to resolve it. Target Date: 2022-08-04 Frequency: Daily Modality: individual Progress: 90%  Related Problem: Reduce overall frequency, intensity, and duration of the anxiety so that daily functioning is not impaired. Description: Maintain involvement in work, family, and social activities. Target Date: 2022-08-04 Frequency: Daily Modality: individual Progress: 90%  Related Problem: Reduce overall frequency, intensity, and duration of the anxiety so that daily functioning is not impaired. Description: Learn to accept limitations in life and commit to tolerating, rather than avoiding, unpleasant emotions while accomplishing meaningful goals. Target Date: 2022-08-04 Frequency: Daily Modality: individual Progress: 80%  Related Problem: Reduce overall frequency, intensity, and duration of the anxiety so that daily functioning is not impaired. Description: Learn and implement problem-solving strategies for realistically addressing worries. Target Date: 2022-08-04 Frequency: Daily Modality: individual Progress: 80%  Related Problem: Reduce overall frequency, intensity, and duration of the anxiety so that daily functioning is not impaired. Description: Identify, challenge, and replace biased, fearful self-talk with positive, realistic, and empowering self-talk. Target Date: 2022-08-04 Frequency: Daily Modality: individual Progress: 80%  Related Problem: Reduce overall frequency, intensity, and duration of the anxiety so that daily  functioning is not impaired. Description: Describe situations, thoughts, feelings, and actions associated with anxieties and worries, their impact on functioning, and attempts to resolve them. Target Date: 2022-08-04 Frequency: Daily Modality: individual Progress: 90%  Client Response full compliance  Service Location Location, [address] [place], [place] [address]  Service Code cpt [id number]  Lifestyle change (exercise, nutrition)  Self-monitoring  Normalize/Reframe  Facilitate problem solving  Identify/label emotions  Validate/empathize  Emotion regulation skills  Self care activities  Related past to present  Session Notes F33.2, F41.1.  Goals: Resolve grief related to the loss of her mother, foster independence, resolve guilt, manage unresolved family of origin issues, resolve feelings associated with failed marriage, address issues related to establishing new romantic relationship. Also, has some conflicts with her sons that are not resolved. Goal Date is 12-23.  Patient agrees to a video phone session due to the Coronavirus pandemic. She is at home and I am in my home office.  Juliann Pulse had her last day at work Friday and the transition out of that job went well. She got very positive comments.  Visiting son and daughter in law [place] along with [place] during week before starting new job. Visit has been good and Malissa feels good about the relationships. She is nervous about starting new job.  Continuing to struggle to better understand and accept Evan's transition to becoming Autumn. She worries about [name] also accepting the transition. We talked about how to work on this issue and facilitate Autumn's journey. She is managing reasonably well and anxiety is under control.        [name], PhD Time 5:10-6:00p 50 min.

## 2022-02-12 NOTE — Progress Notes (Signed)
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 (Status: maintained) -- No Description Entered  Excessive and/or unrealistic worry that is difficult to control occurring more days than not for at least 6 months about a number of events or activities. (Status: maintained) -- No Description Entered  Feelings of hopelessness, worthlessness, or inappropriate guilt. (Status: maintained) -- No Description Entered  Medication Status compliance  Safety none  If Suicidal or Homicidal State Action Taken: unspecified  Current Risk: low Medications unspecified Objectives Related Problem: Develop healthy thinking patterns and beliefs about self, others, and the world that lead to the alleviation and help prevent the relapse of depression. Description: Learn and implement conflict resolution skills to resolve interpersonal problems. Target Date: 2022-08-04 Frequency: Daily Modality: individual Progress: 70%  Related Problem: Develop healthy thinking patterns and beliefs about self, others, and the world that lead to the alleviation and help prevent the relapse of depression. Description: Learn and implement problem-solving and decision-making skills. Target Date: 2022-08-04 Frequency: Daily Modality: individual Progress: 80%  Related Problem: Develop healthy thinking patterns and beliefs about self, others, and the world that lead to the alleviation and help prevent the relapse of depression. Description: Learn and implement behavioral strategies to overcome depression. Target Date: 2022-08-04 Frequency: Daily Modality: individual Progress: 70%  Related Problem: Develop healthy thinking patterns and beliefs about self, others, and the world that lead to the alleviation and help prevent the relapse of  depression. Description: Identify and replace thoughts and beliefs that support depression. Target Date: 2022-08-04 Frequency: Daily Modality: individual Progress: 80%  Related Problem: Develop healthy thinking patterns and beliefs about self, others, and the world that lead to the alleviation and help prevent the relapse of depression. Description: Describe current and past experiences with depression including their impact on functioning and attempts to resolve it. Target Date: 2022-08-04 Frequency: Daily Modality: individual Progress: 90%  Related Problem: Reduce overall frequency, intensity, and duration of the anxiety so that daily functioning is not impaired. Description: Maintain involvement in work, family, and social activities. Target Date: 2022-08-04 Frequency: Daily Modality: individual Progress: 90%  Related Problem: Reduce overall frequency, intensity, and duration of the anxiety so that daily functioning is not impaired. Description: Learn to accept limitations in life and commit to tolerating, rather than avoiding, unpleasant emotions while accomplishing meaningful goals. Target Date: 2022-08-04 Frequency: Daily Modality: individual Progress: 80%  Related Problem: Reduce overall frequency, intensity, and duration of the anxiety so that daily functioning is not impaired. Description: Learn and implement problem-solving strategies for realistically addressing worries. Target Date: 2022-08-04 Frequency: Daily Modality: individual Progress: 80%  Related Problem: Reduce overall frequency, intensity, and duration of the anxiety so that daily functioning is not impaired. Description: Identify, challenge, and replace biased, fearful self-talk with positive, realistic, and empowering self-talk. Target Date: 2022-08-04 Frequency: Daily Modality: individual Progress: 80%  Related Problem: Reduce overall frequency, intensity, and duration of the anxiety so that daily  functioning is not impaired. Description: Describe situations, thoughts, feelings, and actions associated with anxieties and worries, their impact on functioning, and attempts to resolve them. Target Date: 2022-08-04 Frequency: Daily Modality: individual Progress: 90%  Client Response full compliance  Service Location Location, [address] [place], [place] [address]  Service Code cpt [id number]  Lifestyle change (exercise, nutrition)  Self-monitoring  Normalize/Reframe  Facilitate problem solving  Identify/label emotions  Validate/empathize  Emotion regulation skills  Self care activities  Related past to present  Session Notes F33.2, F41.1.  Goals: Resolve grief related to the loss of her mother, foster independence, resolve guilt, manage unresolved family of origin issues, resolve feelings associated with failed marriage, address issues related to establishing new romantic relationship. Also, has some conflicts with her sons that are not resolved. Goal Date is 12-23.  Patient agrees to a video phone session. She is at home and I am in my home office.  Olegario Messier has announced her leaving at work and her bosses were all very understanding and supportive (with the exception of one). She subsequently went on a trip and upon her return her grandchild was born. She is relieved and excited about the work and the baby. The vacation with [name] and his family to [place] was challenging in that his son Gala Romney is very difficult for Olegario Messier to be around. He has extreme political views that are very upsetting to [place]. We discussed how to interact with [name] around this delicate subject in a way that does not interfere with their relationship. She is struggling to not over-react to the situation. She will be starting her new position next Friday. This is a great opportunity. Her confidence is strong and she is reporting good emotional stability.         [name], PhD Time 5:10-6:00p 50 min.

## 2022-02-14 ENCOUNTER — Ambulatory Visit (INDEPENDENT_AMBULATORY_CARE_PROVIDER_SITE_OTHER): Payer: 59 | Admitting: Psychology

## 2022-02-14 DIAGNOSIS — F411 Generalized anxiety disorder: Secondary | ICD-10-CM | POA: Diagnosis not present

## 2022-02-14 NOTE — Progress Notes (Signed)
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 (Status: maintained) -- No Description Entered  Excessive and/or unrealistic worry that is difficult to control occurring more days than not for at least 6 months about a number of events or activities. (Status: maintained) -- No Description Entered  Feelings of hopelessness, worthlessness, or inappropriate guilt. (Status: maintained) -- No Description Entered  Medication Status compliance  Safety none  If Suicidal or Homicidal State Action Taken: unspecified  Current Risk: low Medications unspecified Objectives Related Problem: Develop healthy thinking patterns and beliefs about self, others, and the world that lead to the alleviation and help prevent the relapse of depression. Description: Learn and implement conflict resolution skills to resolve interpersonal problems. Target Date: 2022-08-04 Frequency: Daily Modality: individual Progress: 70%  Related Problem: Develop healthy thinking patterns and beliefs about self, others, and the world that lead to the alleviation and help prevent the relapse of depression. Description: Learn and implement problem-solving and decision-making skills. Target Date: 2022-08-04 Frequency: Daily Modality: individual Progress: 80%  Related Problem: Develop healthy thinking patterns and beliefs about self, others, and the world that lead to the alleviation and help prevent the relapse of depression. Description: Learn and implement behavioral strategies to overcome depression. Target Date: 2022-08-04 Frequency: Daily Modality: individual Progress: 70%  Related Problem: Develop healthy thinking patterns and beliefs about self, others, and the world that lead to the alleviation and help prevent the relapse of depression. Description:  Identify and replace thoughts and beliefs that support depression. Target Date: 2022-08-04 Frequency: Daily Modality: individual Progress: 80%  Related Problem: Develop healthy thinking patterns and beliefs about self, others, and the world that lead to the alleviation and help prevent the relapse of depression. Description: Describe current and past experiences with depression including their impact on functioning and attempts to resolve it. Target Date: 2022-08-04 Frequency: Daily Modality: individual Progress: 90%  Related Problem: Reduce overall frequency, intensity, and duration of the anxiety so that daily functioning is not impaired. Description: Maintain involvement in work, family, and social activities. Target Date: 2022-08-04 Frequency: Daily Modality: individual Progress: 90%  Related Problem: Reduce overall frequency, intensity, and duration of the anxiety so that daily functioning is not impaired. Description: Learn to accept limitations in life and commit to tolerating, rather than avoiding, unpleasant emotions while accomplishing meaningful goals. Target Date: 2022-08-04 Frequency: Daily Modality: individual Progress: 80%  Related Problem: Reduce overall frequency, intensity, and duration of the anxiety so that daily functioning is not impaired. Description: Learn and implement problem-solving strategies for realistically addressing worries. Target Date: 2022-08-04 Frequency: Daily Modality: individual Progress: 80%  Related Problem: Reduce overall frequency, intensity, and duration of the anxiety so that daily functioning is not impaired. Description: Identify, challenge, and replace biased, fearful self-talk with positive, realistic, and empowering self-talk. Target Date: 2022-08-04 Frequency: Daily Modality: individual Progress: 80%  Related Problem: Reduce overall frequency, intensity, and duration of the anxiety so that daily functioning is not  impaired. Description: Describe situations, thoughts, feelings, and actions associated with anxieties and worries, their impact on functioning, and attempts to resolve them. Target Date: 2022-08-04 Frequency: Daily Modality: individual Progress: 90%  Client Response full compliance  Service Location Location, [address] [place], [place] [address]  Service Code cpt [id number]  Lifestyle change (exercise, nutrition)  Self-monitoring  Normalize/Reframe  Facilitate problem solving  Identify/label  emotions  Validate/empathize  Emotion regulation skills  Self care activities  Related past to present  Session Notes F33.2, F41.1.  Goals: Resolve grief related to the loss of her mother, foster independence, resolve guilt, manage unresolved family of origin issues, resolve feelings associated with failed marriage, address issues related to establishing new romantic relationship. Also, has some conflicts with her sons that are not resolved. Goal Date is 12-23.  Patient agrees to a video phone session due to the Coronavirus pandemic. She is at home and I am in my home office.  Juliann Pulse says "lots of change in her life" with the new job and finding her "place". She says things "blew up" in [place] after she left. Both kids called her upset. [name] does not understand what his brother is doing. He said there is nothing feminine about his brother  and his transitioning makes no sense. [name] interprets support as understanding. If [name] or Juliann Pulse ask questions of [name], he interprets it as a lack of support. She feels strongly that [name] is not being realistic and worried he is delusional with regard to his expectations. Juliann Pulse still struggles to grasp how all of this will turn out for [employer]. I suggested that Juliann Pulse consider attending [employer] to better understand how to be supportive and loving to [place]. She is receptive and will try out a meeting.            [name], PhD Time 5:10-6:00p 50 min.

## 2022-02-28 ENCOUNTER — Ambulatory Visit (INDEPENDENT_AMBULATORY_CARE_PROVIDER_SITE_OTHER): Payer: 59 | Admitting: Psychology

## 2022-02-28 DIAGNOSIS — F411 Generalized anxiety disorder: Secondary | ICD-10-CM

## 2022-02-28 NOTE — Progress Notes (Signed)
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 (Status: maintained) -- No Description Entered  Excessive and/or unrealistic worry that is difficult to control occurring more days than not for at least 6 months about a number of events or activities. (Status: maintained) -- No Description Entered  Feelings of hopelessness, worthlessness, or inappropriate guilt. (Status: maintained) -- No Description Entered  Medication Status compliance  Safety none  If Suicidal or Homicidal State Action Taken: unspecified  Current Risk: low Medications unspecified Objectives Related Problem: Develop healthy thinking patterns and beliefs about self, others, and the world that lead to the alleviation and help prevent the relapse of depression. Description: Learn and implement conflict resolution skills to resolve interpersonal problems. Target Date: 2022-08-04 Frequency: Daily Modality: individual Progress: 70%  Related Problem: Develop healthy thinking patterns and beliefs about self, others, and the world that lead to the alleviation and help prevent the relapse of depression. Description: Learn and implement problem-solving and decision-making skills. Target Date: 2022-08-04 Frequency: Daily Modality: individual Progress: 80%  Related Problem: Develop healthy thinking patterns and beliefs about self, others, and the world that lead to the alleviation and help prevent the relapse of depression. Description: Learn and implement behavioral strategies to overcome depression. Target Date: 2022-08-04 Frequency: Daily Modality: individual Progress: 70%  Related Problem: Develop healthy thinking patterns and beliefs about self, others, and the world that lead to the alleviation and help prevent the relapse of depression. Description: Identify and  replace thoughts and beliefs that support depression. Target Date: 2022-08-04 Frequency: Daily Modality: individual Progress: 80%  Related Problem: Develop healthy thinking patterns and beliefs about self, others, and the world that lead to the alleviation and help prevent the relapse of depression. Description: Describe current and past experiences with depression including their impact on functioning and attempts to resolve it. Target Date: 2022-08-04 Frequency: Daily Modality: individual Progress: 90%  Related Problem: Reduce overall frequency, intensity, and duration of the anxiety so that daily functioning is not impaired. Description: Maintain involvement in work, family, and social activities. Target Date: 2022-08-04 Frequency: Daily Modality: individual Progress: 90%  Related Problem: Reduce overall frequency, intensity, and duration of the anxiety so that daily functioning is not impaired. Description: Learn to accept limitations in life and commit to tolerating, rather than avoiding, unpleasant emotions while accomplishing meaningful goals. Target Date: 2022-08-04 Frequency: Daily Modality: individual Progress: 80%  Related Problem: Reduce overall frequency, intensity, and duration of the anxiety so that daily functioning is not impaired. Description: Learn and implement problem-solving strategies for realistically addressing worries. Target Date: 2022-08-04 Frequency: Daily Modality: individual Progress: 80%  Related Problem: Reduce overall frequency, intensity, and duration of the anxiety so that daily functioning is not impaired. Description: Identify, challenge, and replace biased, fearful self-talk with positive, realistic, and empowering self-talk. Target Date: 2022-08-04 Frequency: Daily Modality: individual Progress: 80%  Related Problem: Reduce overall frequency, intensity, and duration of the anxiety so that daily functioning is not impaired. Description:  Describe situations, thoughts, feelings, and actions associated with anxieties and worries, their impact on functioning, and attempts to resolve them. Target Date: 2022-08-04 Frequency: Daily Modality: individual Progress: 90%  Client Response full compliance  Service Location Location, [address] [place], [place] [address]  Service Code cpt [id number]  Lifestyle change (exercise, nutrition)  Self-monitoring  Normalize/Reframe  Facilitate problem solving  Identify/label emotions  Validate/empathize  Emotion regulation skills  Self  care activities  Related past to present  Session Notes F33.2, F41.1.  Goals: Resolve grief related to the loss of her mother, foster independence, resolve guilt, manage unresolved family of origin issues, resolve feelings associated with failed marriage, address issues related to establishing new romantic relationship. Also, has some conflicts with her sons that are not resolved. Goal Date is 12-23.  Patient agrees to a video phone session. She is at home and I am in my home office.  Pamela Ray is scheduled to go to a [employer] meeting and is hoping to be open minded. It is on the 18th. Continues to seek consultation from an "expert" on transgender to get validation that Pamela Ray is taking the right and acceptable steps. I provided names of two resources for her to contact.  Pamela Ray says that she and Pamela Ray are doing well, but there is some stress for her. She is unclear about what she wants moving forward. She thinks that part of the reason they don't live together is "because of the children". Her son Pamela Ray fears that the situation with Pamela Ray is a strain on the relationship with Pamela Ray.               [name], PhD Time 5:10-6:00p 50 min.

## 2022-03-07 ENCOUNTER — Encounter: Payer: Self-pay | Admitting: Family

## 2022-03-13 ENCOUNTER — Encounter: Payer: Self-pay | Admitting: Family

## 2022-03-14 ENCOUNTER — Ambulatory Visit: Payer: 59 | Admitting: Psychology

## 2022-03-14 ENCOUNTER — Ambulatory Visit: Payer: Self-pay | Admitting: Psychology

## 2022-03-28 ENCOUNTER — Ambulatory Visit (INDEPENDENT_AMBULATORY_CARE_PROVIDER_SITE_OTHER): Payer: 59 | Admitting: Psychology

## 2022-03-28 DIAGNOSIS — F411 Generalized anxiety disorder: Secondary | ICD-10-CM | POA: Diagnosis not present

## 2022-03-28 NOTE — Progress Notes (Addendum)
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 (Status: maintained) -- No Description Entered  Excessive and/or unrealistic worry that is difficult to control occurring more days than not for at least 6 months about a number of events or activities. (Status: maintained) -- No Description Entered  Feelings of hopelessness, worthlessness, or inappropriate guilt. (Status: maintained) -- No Description Entered  Medication Status compliance  Safety none  If Suicidal or Homicidal State Action Taken: unspecified  Current Risk: low Medications unspecified Objectives Related Problem: Develop healthy thinking patterns and beliefs about self, others, and the world that lead to the alleviation and help prevent the relapse of depression. Description: Learn and implement conflict resolution skills to resolve interpersonal problems. Target Date: 2022-08-04 Frequency: Daily Modality: individual Progress: 70%  Related Problem: Develop healthy thinking patterns and beliefs about self, others, and the world that lead to the alleviation and help prevent the relapse of depression. Description: Learn and implement problem-solving and decision-making skills. Target Date: 2022-08-04 Frequency: Daily Modality: individual Progress: 80%  Related Problem: Develop healthy thinking patterns and beliefs about self, others, and the world that lead to the alleviation and help prevent the relapse of depression. Description: Learn and implement behavioral strategies to overcome depression. Target Date: 2022-08-04 Frequency: Daily Modality: individual Progress: 70%  Related Problem: Develop healthy thinking patterns and beliefs about self, others, and the world that lead to the alleviation and help prevent the relapse of  depression. Description: Identify and replace thoughts and beliefs that support depression. Target Date: 2022-08-04 Frequency: Daily Modality: individual Progress: 80%  Related Problem: Develop healthy thinking patterns and beliefs about self, others, and the world that lead to the alleviation and help prevent the relapse of depression. Description: Describe current and past experiences with depression including their impact on functioning and attempts to resolve it. Target Date: 2022-08-04 Frequency: Daily Modality: individual Progress: 90%  Related Problem: Reduce overall frequency, intensity, and duration of the anxiety so that daily functioning is not impaired. Description: Maintain involvement in work, family, and social activities. Target Date: 2022-08-04 Frequency: Daily Modality: individual Progress: 90%  Related Problem: Reduce overall frequency, intensity, and duration of the anxiety so that daily functioning is not impaired. Description: Learn to accept limitations in life and commit to tolerating, rather than avoiding, unpleasant emotions while accomplishing meaningful goals. Target Date: 2022-08-04 Frequency: Daily Modality: individual Progress: 80%  Related Problem: Reduce overall frequency, intensity, and duration of the anxiety so that daily functioning is not impaired. Description: Learn and implement problem-solving strategies for realistically addressing worries. Target Date: 2022-08-04 Frequency: Daily Modality: individual Progress: 80%  Related Problem: Reduce overall frequency, intensity, and duration of the anxiety so that daily functioning is not impaired. Description: Identify, challenge, and replace biased, fearful self-talk with positive, realistic, and empowering self-talk. Target Date: 2022-08-04 Frequency: Daily Modality: individual Progress: 80%  Related Problem: Reduce overall frequency, intensity, and duration of the anxiety so that daily  functioning is not impaired. Description: Describe situations, thoughts, feelings, and actions associated with anxieties and worries, their impact on functioning, and attempts to resolve them. Target Date: 2022-08-04 Frequency: Daily Modality: individual Progress: 90%  Client Response full compliance  Service Location Location, [address] [place], [place] [address]  Service Code cpt [id number]  Lifestyle change (exercise, nutrition)  Self-monitoring  Normalize/Reframe  Facilitate problem solving  Identify/label emotions  Validate/empathize  Emotion regulation skills  Self care activities  Related past to present  Session Notes F33.2, F41.1.  Goals: Resolve grief related to the loss of her mother, foster independence, resolve guilt, manage unresolved family of origin issues, resolve feelings associated with failed marriage, address issues related to establishing new romantic relationship. Also, has some conflicts with her sons that are not resolved. Goal Date is 12-23.  Patient agrees to a  phone session. She is at home and I am in my home office.  Session note: Pamela Ray says she sent e-mails to the referrals I provided. One person wrote nice letter back and said she could not answer unless she was a patient and that she does not have any room for a patient. The other was a automatic response saying they are not available. She is very sensitive to how others perceive her and fears how she will be received at the [facility].  She reports that she found a house she likes and put in an offer that was accepted. Was disappointed at how her son responded. Pamela Ray needs to tell his grandparents about his transgender life. She has great concern that her father will reject Pamela Ray in a hostile way. We discussed strategy on telling her father. Suggested that she pave the wave and work in concert with her son.                     [name], PhD Time 5:10-6:00p 50 min.

## 2022-04-11 ENCOUNTER — Ambulatory Visit (INDEPENDENT_AMBULATORY_CARE_PROVIDER_SITE_OTHER): Payer: 59 | Admitting: Psychology

## 2022-04-11 DIAGNOSIS — F411 Generalized anxiety disorder: Secondary | ICD-10-CM

## 2022-04-11 NOTE — Progress Notes (Signed)
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 (Status: maintained) -- No Description Entered  Excessive and/or unrealistic worry that is difficult to control occurring more days than not for at least 6 months about a number of events or activities. (Status: maintained) -- No Description Entered  Feelings of hopelessness, worthlessness, or inappropriate guilt. (Status: maintained) -- No Description Entered  Medication Status compliance  Safety none  If Suicidal or Homicidal State Action Taken: unspecified  Current Risk: low Medications unspecified Objectives Related Problem: Develop healthy thinking patterns and beliefs about self, others, and the world that lead to the alleviation and help prevent the relapse of depression. Description: Learn and implement conflict resolution skills to resolve interpersonal problems. Target Date: 2022-08-04 Frequency: Daily Modality: individual Progress: 70%  Related Problem: Develop healthy thinking patterns and beliefs about self, others, and the world that lead to the alleviation and help prevent the relapse of depression. Description: Learn and implement problem-solving and decision-making skills. Target Date: 2022-08-04 Frequency: Daily Modality: individual Progress: 80%  Related Problem: Develop healthy thinking patterns and beliefs about self, others, and the world that lead to the alleviation and help prevent the relapse of depression. Description: Learn and implement behavioral strategies to overcome depression. Target Date: 2022-08-04 Frequency: Daily Modality: individual Progress: 70%  Related Problem: Develop healthy thinking patterns and beliefs about self, others, and the world that lead to the alleviation and help prevent  the relapse of depression. Description: Identify and replace thoughts and beliefs that support depression. Target Date: 2022-08-04 Frequency: Daily Modality: individual Progress: 80%  Related Problem: Develop healthy thinking patterns and beliefs about self, others, and the world that lead to the alleviation and help prevent the relapse of depression. Description: Describe current and past experiences with depression including their impact on functioning and attempts to resolve it. Target Date: 2022-08-04 Frequency: Daily Modality: individual Progress: 90%  Related Problem: Reduce overall frequency, intensity, and duration of the anxiety so that daily functioning is not impaired. Description: Maintain involvement in work, family, and social activities. Target Date: 2022-08-04 Frequency: Daily Modality: individual Progress: 90%  Related Problem: Reduce overall frequency, intensity, and duration of the anxiety so that daily functioning is not impaired. Description: Learn to accept limitations in life and commit to tolerating, rather than avoiding, unpleasant emotions while accomplishing meaningful goals. Target Date: 2022-08-04 Frequency: Daily Modality: individual Progress: 80%  Related Problem: Reduce overall frequency, intensity, and duration of the anxiety so that daily functioning is not impaired. Description: Learn and implement problem-solving strategies for realistically addressing worries. Target Date: 2022-08-04 Frequency: Daily Modality: individual Progress: 80%  Related Problem: Reduce overall frequency, intensity, and duration of the anxiety so that daily functioning is not impaired. Description: Identify, challenge, and replace biased, fearful self-talk with positive, realistic, and empowering self-talk. Target Date: 2022-08-04 Frequency: Daily Modality: individual Progress: 80%  Related Problem: Reduce overall frequency, intensity, and duration of the anxiety so that  daily functioning is not impaired. Description: Describe situations, thoughts, feelings, and actions associated with anxieties and worries, their impact on functioning, and attempts to resolve them. Target Date: 2022-08-04 Frequency: Daily Modality: individual Progress: 90%  Client Response full compliance  Service Location Location, [address] [place], [place]  [place] [phone]  Lifestyle change (exercise, nutrition)  Self-monitoring  Normalize/Reframe  Facilitate problem solving  Identify/label emotions  Validate/empathize  Emotion regulation skills  Self care activities  Related past to present  Session Notes F33.2, F41.1.  Goals: Resolve grief related to the loss of her mother, foster independence, resolve guilt, manage unresolved family of origin issues, resolve feelings associated with failed marriage, address issues related to establishing new romantic relationship. Also, has some conflicts with her sons that are not resolved. Goal Date is 12-23.  Patient agrees to a video phone session. She is at home and I am in my home office.  Juliann Pulse went to her first [employer] meeting. Says it was difficult. The participants were kind and welcoming. Everyone there was for trans. There were about 15 people at the meeting. It was a mixed experience and she will likely go back. She is closing on new house on Wednesday, September 6th. Is going to beach this weekend and then to see grandchild on the 14th. In addition she is having chronic bleeding and may need to have surgery. With all that is going on, she is still managing well. Is keeping her priorities in check and not feeling out of control. Significant improvement.                      [name], PhD Time 5:10-6:00p 50 min.

## 2022-04-25 ENCOUNTER — Ambulatory Visit: Payer: 59 | Admitting: Psychology

## 2022-05-09 ENCOUNTER — Ambulatory Visit: Payer: 59 | Admitting: Psychology

## 2022-05-23 ENCOUNTER — Ambulatory Visit: Payer: 59 | Admitting: Psychology

## 2022-06-06 ENCOUNTER — Ambulatory Visit: Payer: 59 | Admitting: Psychology

## 2022-06-07 ENCOUNTER — Encounter: Payer: Self-pay | Admitting: Family

## 2022-06-07 ENCOUNTER — Inpatient Hospital Stay (HOSPITAL_BASED_OUTPATIENT_CLINIC_OR_DEPARTMENT_OTHER): Payer: 59 | Admitting: Family

## 2022-06-07 ENCOUNTER — Inpatient Hospital Stay: Payer: 59 | Attending: Hematology and Oncology

## 2022-06-07 VITALS — BP 138/85 | HR 64 | Temp 98.3°F | Resp 17 | Wt 149.0 lb

## 2022-06-07 DIAGNOSIS — T386X5A Adverse effect of antigonadotrophins, antiestrogens, antiandrogens, not elsewhere classified, initial encounter: Secondary | ICD-10-CM

## 2022-06-07 DIAGNOSIS — D509 Iron deficiency anemia, unspecified: Secondary | ICD-10-CM

## 2022-06-07 DIAGNOSIS — M818 Other osteoporosis without current pathological fracture: Secondary | ICD-10-CM

## 2022-06-07 DIAGNOSIS — F064 Anxiety disorder due to known physiological condition: Secondary | ICD-10-CM

## 2022-06-07 DIAGNOSIS — R739 Hyperglycemia, unspecified: Secondary | ICD-10-CM

## 2022-06-07 DIAGNOSIS — R002 Palpitations: Secondary | ICD-10-CM | POA: Diagnosis not present

## 2022-06-07 DIAGNOSIS — M810 Age-related osteoporosis without current pathological fracture: Secondary | ICD-10-CM

## 2022-06-07 DIAGNOSIS — E7801 Familial hypercholesterolemia: Secondary | ICD-10-CM

## 2022-06-07 DIAGNOSIS — Z8249 Family history of ischemic heart disease and other diseases of the circulatory system: Secondary | ICD-10-CM

## 2022-06-07 DIAGNOSIS — D0512 Intraductal carcinoma in situ of left breast: Secondary | ICD-10-CM | POA: Diagnosis not present

## 2022-06-07 DIAGNOSIS — D508 Other iron deficiency anemias: Secondary | ICD-10-CM

## 2022-06-07 DIAGNOSIS — E559 Vitamin D deficiency, unspecified: Secondary | ICD-10-CM

## 2022-06-07 LAB — CBC WITH DIFFERENTIAL (CANCER CENTER ONLY)
Abs Immature Granulocytes: 0.01 10*3/uL (ref 0.00–0.07)
Basophils Absolute: 0 10*3/uL (ref 0.0–0.1)
Basophils Relative: 1 %
Eosinophils Absolute: 0.1 10*3/uL (ref 0.0–0.5)
Eosinophils Relative: 2 %
HCT: 35.8 % — ABNORMAL LOW (ref 36.0–46.0)
Hemoglobin: 11 g/dL — ABNORMAL LOW (ref 12.0–15.0)
Immature Granulocytes: 0 %
Lymphocytes Relative: 40 %
Lymphs Abs: 1.9 10*3/uL (ref 0.7–4.0)
MCH: 22.8 pg — ABNORMAL LOW (ref 26.0–34.0)
MCHC: 30.7 g/dL (ref 30.0–36.0)
MCV: 74.3 fL — ABNORMAL LOW (ref 80.0–100.0)
Monocytes Absolute: 0.4 10*3/uL (ref 0.1–1.0)
Monocytes Relative: 8 %
Neutro Abs: 2.3 10*3/uL (ref 1.7–7.7)
Neutrophils Relative %: 49 %
Platelet Count: 287 10*3/uL (ref 150–400)
RBC: 4.82 MIL/uL (ref 3.87–5.11)
RDW: 16 % — ABNORMAL HIGH (ref 11.5–15.5)
WBC Count: 4.6 10*3/uL (ref 4.0–10.5)
nRBC: 0 % (ref 0.0–0.2)

## 2022-06-07 LAB — CMP (CANCER CENTER ONLY)
ALT: 19 U/L (ref 0–44)
AST: 23 U/L (ref 15–41)
Albumin: 4.6 g/dL (ref 3.5–5.0)
Alkaline Phosphatase: 80 U/L (ref 38–126)
Anion gap: 9 (ref 5–15)
BUN: 14 mg/dL (ref 6–20)
CO2: 30 mmol/L (ref 22–32)
Calcium: 10.4 mg/dL — ABNORMAL HIGH (ref 8.9–10.3)
Chloride: 103 mmol/L (ref 98–111)
Creatinine: 0.56 mg/dL (ref 0.44–1.00)
GFR, Estimated: >60 mL/min (ref >60)
Glucose, Bld: 99 mg/dL (ref 70–99)
Potassium: 4.2 mmol/L (ref 3.5–5.1)
Sodium: 142 mmol/L (ref 135–145)
Total Bilirubin: 0.5 mg/dL (ref 0.3–1.2)
Total Protein: 7.2 g/dL (ref 6.5–8.1)

## 2022-06-07 LAB — FERRITIN: Ferritin: 5 ng/mL — ABNORMAL LOW (ref 11–307)

## 2022-06-07 MED ORDER — VITAMIN D (ERGOCALCIFEROL) 1.25 MG (50000 UNIT) PO CAPS: 50000.0000 [IU] | ORAL_CAPSULE | ORAL | 4 refills | Status: DC

## 2022-06-07 NOTE — Progress Notes (Signed)
Hematology and Oncology Follow Up Visit  Pamela Ray [id number] 10-23-1965 56 y.o. 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 She is doing fairly well but having issues with hot flashes, night sweats and associated palpitations.  This and her busy schedule has her feeling fatigue at times.  She had her cycle in July lasting 10 days and again in August in 16 days. No cycle since August. She plans to discuss with her PCP.  No other blood loss noted. No abnormal bruising, no petechiae.  No fever, chills, n/v, cough, rash, dizziness, SOB, chest pain, abdominal pain or changes in bowel or bladder habits.  No swelling, tenderness, numbness or tingling in her extremities.  No falls or syncope reported.  Weight is stable at 149 lbs. Appetite and hydration are good.   ECOG Performance Status: 1 - Symptomatic but completely ambulatory  Medications:  Allergies as of 06/07/2022       Reactions   Amoxicillin Itching   Cephalexin Itching   Penicillins Hives   Venofer [iron Sucrose] Palpitations   Patient medicated with acetaminophen and diphenhydramine. Able to restart infusion and patient tolerated. See progress noted from 12/14/2021.        Medication List        Accurate as of June 07, 2022  3:06 PM. If you have any questions, ask your nurse or doctor.          ALPRAZolam 0.25 MG tablet Commonly known as: XANAX Take 1 tablet (0.25 mg total) by mouth 3 (three) times daily as needed. for anxiety   Boric Acid Gran Insert 1 capsule in vagina every night x 14 days then every Monday and Thursday night indefinitely   Botulinum Toxin Type A (Cosm) 100 units Solr Every 3 months    chlorproMAZINE 25 MG tablet Commonly known as: THORAZINE   hydrocortisone 2.5 % ointment   ibuprofen 200 MG tablet Commonly known as: ADVIL Take 200 mg by mouth every 6 (six) hours as needed.   SUMAtriptan 100 MG tablet Commonly known as: IMITREX Take by mouth.   Vitamin D (Ergocalciferol) 1.25 MG (50000 UNIT) Caps capsule Commonly known as: DRISDOL TAKE 1 CAPSULE BY MOUTH  ONCE WEEKLY        Allergies:  Allergies  Allergen Reactions   Amoxicillin Itching   Cephalexin Itching   Penicillins Hives   Venofer [Iron Sucrose] Palpitations    Patient medicated with acetaminophen and diphenhydramine. Able to restart infusion and patient tolerated. See progress noted from 12/14/2021.    Past Medical History, Surgical history, Social history, and Family History were reviewed and updated.  Review of Systems: All other 10 point review of systems is negative.   Physical Exam:  weight is 149 lb (67.6 kg). Her oral temperature is 98.3 F (36.8 C). Her blood pressure is 138/85 and her pulse is 64. Her respiration is 17 and oxygen saturation is 100%.   Wt Readings from Last 3 Encounters:  06/07/22 149 lb (67.6 kg)  12/07/21 141 lb 6.4 oz (64.1 kg)  06/08/21 137 lb 1.9 oz (62.2 kg)    Ocular: Sclerae unicteric, pupils equal, round and reactive to light  Ear-nose-throat: Oropharynx clear, dentition fair Lymphatic: No cervical or supraclavicular adenopathy Lungs no rales or rhonchi, good excursion bilaterally Heart regular rate and rhythm, no murmur appreciated Abd soft, nontender, positive bowel sounds MSK no focal spinal tenderness, no joint edema Neuro: non-focal, well-oriented, appropriate affect Breasts: Deferred   Lab Results  Component Value Date   WBC 4.6 06/07/2022   HGB 11.0 (L) 06/07/2022   HCT 35.8 (L) 06/07/2022   MCV 74.3 (L) 06/07/2022   PLT 287 06/07/2022   Lab Results  Component Value Date   FERRITIN 8 (L) 12/07/2021   IRON 42 12/07/2021   TIBC 535 (H)  12/07/2021   UIBC 493 (H) 12/07/2021   IRONPCTSAT 8 (L) 12/07/2021   Lab Results  Component Value Date   RETICCTPCT 1.0 05/12/2015   RBC 4.82 06/07/2022   RETICCTABS 48.9 05/12/2015   No results found for: "KPAFRELGTCHN", "LAMBDASER", "KAPLAMBRATIO" No results found for: "IGGSERUM", "IGA", "IGMSERUM" No results found for: "TOTALPROTELP", "ALBUMINELP", "A1GS", "A2GS", "BETS", "BETA2SER", "GAMS", "MSPIKE", "SPEI"   Chemistry      Component Value Date/Time   NA 141 12/07/2021 1436   NA 141 07/25/2017 1131   NA 140 12/04/2016 0747   K 3.9 12/07/2021 1436   K 3.9 07/25/2017 1131   K 4.2 12/04/2016 0747   CL 104 12/07/2021 1436   CL 108 07/25/2017 1131   CO2 32 12/07/2021 1436   CO2 27 07/25/2017 1131   CO2 25 12/04/2016 0747   BUN 13 12/07/2021 1436   BUN 12 07/25/2017 1131   BUN 13.2 12/04/2016 0747   CREATININE 0.65 12/07/2021 1436   CREATININE 0.7 07/25/2017 1131   CREATININE 0.7 12/04/2016 0747      Component Value Date/Time   CALCIUM 10.1 12/07/2021 1436   CALCIUM 9.4 07/25/2017 1131   CALCIUM 9.3 12/04/2016 0747   ALKPHOS 68 12/07/2021 1436   ALKPHOS 65 07/25/2017 1131   ALKPHOS 64 12/04/2016 0747   AST 18 12/07/2021 1436   AST 17 12/04/2016 0747   ALT 16 12/07/2021 1436   ALT 20 07/25/2017 1131   ALT 10 12/04/2016 0747   BILITOT 0.5 12/07/2021 1436   BILITOT 0.73 12/04/2016 0747       Impression and Plan: Pamela Ray is a very pleasant 56 yo caucasian female with 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 She had a lumpectomy followed by radiation and is now on Fareston. She will complete a full 10 years of Fareston (2024).  Iron studies pending.  Follow-up in 6 months.    [name], NP 10/20/20233:06 PM

## 2022-06-10 LAB — IRON AND IRON BINDING CAPACITY (CC-WL,HP ONLY)
Iron: 29 ug/dL (ref 28–170)
Saturation Ratios: 5 % — ABNORMAL LOW (ref 10.4–31.8)
TIBC: 578 ug/dL — ABNORMAL HIGH (ref 250–450)
UIBC: 549 ug/dL — ABNORMAL HIGH (ref 148–442)

## 2022-06-12 ENCOUNTER — Other Ambulatory Visit: Payer: Self-pay | Admitting: Pharmacist

## 2022-06-14 ENCOUNTER — Inpatient Hospital Stay: Payer: 59

## 2022-06-14 VITALS — BP 127/75 | HR 63 | Temp 98.5°F | Resp 17

## 2022-06-14 DIAGNOSIS — D509 Iron deficiency anemia, unspecified: Secondary | ICD-10-CM | POA: Diagnosis not present

## 2022-06-14 DIAGNOSIS — D508 Other iron deficiency anemias: Secondary | ICD-10-CM

## 2022-06-14 MED ORDER — SODIUM CHLORIDE 0.9 % IV SOLN: INTRAVENOUS | Status: DC

## 2022-06-14 MED ORDER — SODIUM CHLORIDE 0.9 % IV SOLN
200.0000 mg | Freq: Once | INTRAVENOUS | Status: AC
  Administered 2022-06-14: 200 mg via INTRAVENOUS
  Filled 2022-06-14: qty 200

## 2022-06-14 MED ORDER — ACETAMINOPHEN 325 MG PO TABS
650.0000 mg | ORAL_TABLET | Freq: Once | ORAL | Status: AC
  Administered 2022-06-14: 650 mg via ORAL
  Filled 2022-06-14: qty 2

## 2022-06-14 MED ORDER — DIPHENHYDRAMINE HCL 25 MG PO CAPS
50.0000 mg | ORAL_CAPSULE | Freq: Once | ORAL | Status: AC
  Administered 2022-06-14: 50 mg via ORAL
  Filled 2022-06-14: qty 2

## 2022-06-14 NOTE — Patient Instructions (Signed)

## 2022-06-20 ENCOUNTER — Ambulatory Visit (INDEPENDENT_AMBULATORY_CARE_PROVIDER_SITE_OTHER): Payer: 59 | Admitting: Psychology

## 2022-06-20 DIAGNOSIS — F411 Generalized anxiety disorder: Secondary | ICD-10-CM | POA: Diagnosis not present

## 2022-06-20 NOTE — Progress Notes (Signed)
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 (Status: maintained) -- No Description Entered  Excessive and/or unrealistic worry that is difficult to control occurring more days than not for at least 6 months about a number of events or activities. (Status: maintained) -- No Description Entered  Feelings of hopelessness, worthlessness, or inappropriate guilt. (Status: maintained) -- No Description Entered  Medication Status compliance  Safety none  If Suicidal or Homicidal State Action Taken: unspecified  Current Risk: low Medications unspecified Objectives Related Problem: Develop healthy thinking patterns and beliefs about self, others, and the world that lead to the alleviation and help prevent the relapse of depression. Description: Learn and implement conflict resolution skills to resolve interpersonal problems. Target Date: 2022-08-04 Frequency: Daily Modality: individual Progress: 70%  Related Problem: Develop healthy thinking patterns and beliefs about self, others, and the world that lead to the alleviation and help prevent the relapse of depression. Description: Learn and implement problem-solving and decision-making skills. Target Date: 2022-08-04 Frequency: Daily Modality: individual Progress: 80%  Related Problem: Develop healthy thinking patterns and beliefs about self, others, and the world that lead to the alleviation and help prevent the relapse of depression. Description: Learn and implement behavioral strategies to overcome depression. Target Date: 2022-08-04 Frequency: Daily Modality: individual Progress: 70%  Related Problem: Develop healthy thinking patterns and beliefs about self, others, and the world that lead to the alleviation and help prevent the relapse of depression. Description: Identify and  replace thoughts and beliefs that support depression. Target Date: 2022-08-04 Frequency: Daily Modality: individual Progress: 80%  Related Problem: Develop healthy thinking patterns and beliefs about self, others, and the world that lead to the alleviation and help prevent the relapse of depression. Description: Describe current and past experiences with depression including their impact on functioning and attempts to resolve it. Target Date: 2022-08-04 Frequency: Daily Modality: individual Progress: 90%  Related Problem: Reduce overall frequency, intensity, and duration of the anxiety so that daily functioning is not impaired. Description: Maintain involvement in work, family, and social activities. Target Date: 2022-08-04 Frequency: Daily Modality: individual Progress: 90%  Related Problem: Reduce overall frequency, intensity, and duration of the anxiety so that daily functioning is not impaired. Description: Learn to accept limitations in life and commit to tolerating, rather than avoiding, unpleasant emotions while accomplishing meaningful goals. Target Date: 2022-08-04 Frequency: Daily Modality: individual Progress: 80%  Related Problem: Reduce overall frequency, intensity, and duration of the anxiety so that daily functioning is not impaired. Description: Learn and implement problem-solving strategies for realistically addressing worries. Target Date: 2022-08-04 Frequency: Daily Modality: individual Progress: 80%  Related Problem: Reduce overall frequency, intensity, and duration of the anxiety so that daily functioning is not impaired. Description: Identify, challenge, and replace biased, fearful self-talk with positive, realistic, and empowering self-talk. Target Date: 2022-08-04 Frequency: Daily Modality: individual Progress: 80%  Related Problem: Reduce overall frequency, intensity, and duration of the anxiety so that daily functioning is not impaired. Description:  Describe situations, thoughts, feelings, and actions associated with anxieties and worries, their impact on functioning, and attempts to resolve them. Target Date: 2022-08-04 Frequency: Daily Modality: individual Progress: 90%  Client Response full compliance  Service Location Location, [address] [place], [place] [address]  Service Code cpt [phone]  Lifestyle change (exercise, nutrition)  Self-monitoring  Normalize/Reframe  Facilitate problem solving  Identify/label emotions  Validate/empathize  Emotion regulation skills  Self  care activities  Related past to present  Session Notes F33.2, F41.1.  Goals: Resolve grief related to the loss of her mother, foster independence, resolve guilt, manage unresolved family of origin issues, resolve feelings associated with failed marriage, address issues related to establishing new romantic relationship. Also, has some conflicts with her sons that are not resolved. Goal Date is 12-23.  Patient agrees to a video phone session. She is at home and I am in my home office.  Juliann Pulse says she has been "burning the candle" at both ends. She is extremely busy at work. She recently moved into a new home where she feels much happier. She said that [name] (Autumn) stayed at her house and broke her stove. She feels it may have been out of frustration following a break-up. She is thinking [name] will go to his brother's house for Thanksgiving. She tried to navigate with [name] what he plans for Thanksgiving. [name] did not share their plans with her and she fears  telling her parents about his sexual orientation. [name] felt it was appropriate to share at Thanksgiving and Juliann Pulse feels it would be unfair to her parents at the holiday. She is also concerned about having Evan around [place] son Marden Noble. 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 [name] tells her to "stop treating my coming out as a burden on others". [name] is still undecided. Talked about how to manage  situation with him and the family.                       [name], PhD Time 5:10-6:00p 50 min.

## 2022-06-21 ENCOUNTER — Inpatient Hospital Stay: Payer: 59 | Attending: Hematology and Oncology

## 2022-06-21 VITALS — BP 110/71 | HR 72 | Temp 97.8°F | Resp 17

## 2022-06-21 DIAGNOSIS — D508 Other iron deficiency anemias: Secondary | ICD-10-CM

## 2022-06-21 DIAGNOSIS — D509 Iron deficiency anemia, unspecified: Secondary | ICD-10-CM | POA: Insufficient documentation

## 2022-06-21 MED ORDER — SODIUM CHLORIDE 0.9 % IV SOLN: Freq: Once | INTRAVENOUS | Status: AC

## 2022-06-21 MED ORDER — SODIUM CHLORIDE 0.9 % IV SOLN
200.0000 mg | Freq: Once | INTRAVENOUS | Status: AC
  Administered 2022-06-21: 200 mg via INTRAVENOUS
  Filled 2022-06-21: qty 200

## 2022-06-21 MED ORDER — DIPHENHYDRAMINE HCL 25 MG PO CAPS
50.0000 mg | ORAL_CAPSULE | Freq: Once | ORAL | Status: AC
  Administered 2022-06-21: 25 mg via ORAL
  Filled 2022-06-21: qty 2

## 2022-06-21 MED ORDER — ACETAMINOPHEN 325 MG PO TABS
650.0000 mg | ORAL_TABLET | Freq: Once | ORAL | Status: AC
  Administered 2022-06-21: 650 mg via ORAL
  Filled 2022-06-21: qty 2

## 2022-06-21 NOTE — Patient Instructions (Signed)

## 2022-06-28 ENCOUNTER — Inpatient Hospital Stay: Payer: 59

## 2022-06-28 VITALS — BP 118/67 | HR 72 | Temp 97.8°F | Resp 16

## 2022-06-28 DIAGNOSIS — D0512 Intraductal carcinoma in situ of left breast: Secondary | ICD-10-CM

## 2022-06-28 DIAGNOSIS — D509 Iron deficiency anemia, unspecified: Secondary | ICD-10-CM | POA: Diagnosis not present

## 2022-06-28 DIAGNOSIS — D508 Other iron deficiency anemias: Secondary | ICD-10-CM

## 2022-06-28 MED ORDER — ACETAMINOPHEN 325 MG PO TABS: 650.0000 mg | ORAL_TABLET | Freq: Once | ORAL | Status: DC

## 2022-06-28 MED ORDER — DIPHENHYDRAMINE HCL 25 MG PO CAPS: 50.0000 mg | ORAL_CAPSULE | Freq: Once | ORAL | Status: DC

## 2022-06-28 MED ORDER — SODIUM CHLORIDE 0.9 % IV SOLN: INTRAVENOUS | Status: DC

## 2022-06-28 MED ORDER — SODIUM CHLORIDE 0.9 % IV SOLN
200.0000 mg | Freq: Once | INTRAVENOUS | Status: AC
  Administered 2022-06-28: 200 mg via INTRAVENOUS
  Filled 2022-06-28: qty 200

## 2022-06-28 NOTE — Patient Instructions (Signed)

## 2022-07-04 ENCOUNTER — Ambulatory Visit (INDEPENDENT_AMBULATORY_CARE_PROVIDER_SITE_OTHER): Payer: 59 | Admitting: Psychology

## 2022-07-04 DIAGNOSIS — F411 Generalized anxiety disorder: Secondary | ICD-10-CM

## 2022-07-04 NOTE — Progress Notes (Signed)
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 (Status: maintained) -- No Description Entered  Excessive and/or unrealistic worry that is difficult to control occurring more days than not for at least 6 months about a number of events or activities. (Status: maintained) -- No Description Entered  Feelings of hopelessness, worthlessness, or inappropriate guilt. (Status: maintained) -- No Description Entered  Medication Status compliance  Safety none  If Suicidal or Homicidal State Action Taken: unspecified  Current Risk: low Medications unspecified Objectives Related Problem: Develop healthy thinking patterns and beliefs about self, others, and the world that lead to the alleviation and help prevent the relapse of depression. Description: Learn and implement conflict resolution skills to resolve interpersonal problems. Target Date: 2022-08-04 Frequency: Daily Modality: individual Progress: 70%  Related Problem: Develop healthy thinking patterns and beliefs about self, others, and the world that lead to the alleviation and help prevent the relapse of depression. Description: Learn and implement problem-solving and decision-making skills. Target Date: 2022-08-04 Frequency: Daily Modality: individual Progress: 80%  Related Problem: Develop healthy thinking patterns and beliefs about self, others, and the world that lead to the alleviation and help prevent the relapse of depression. Description: Learn and implement behavioral strategies to overcome depression. Target Date: 2022-08-04 Frequency: Daily Modality: individual Progress: 70%  Related Problem: Develop healthy thinking patterns and beliefs about self, others, and the world that lead to the alleviation and help prevent the relapse of  depression. Description: Identify and replace thoughts and beliefs that support depression. Target Date: 2022-08-04 Frequency: Daily Modality: individual Progress: 80%  Related Problem: Develop healthy thinking patterns and beliefs about self, others, and the world that lead to the alleviation and help prevent the relapse of depression. Description: Describe current and past experiences with depression including their impact on functioning and attempts to resolve it. Target Date: 2022-08-04 Frequency: Daily Modality: individual Progress: 90%  Related Problem: Reduce overall frequency, intensity, and duration of the anxiety so that daily functioning is not impaired. Description: Maintain involvement in work, family, and social activities. Target Date: 2022-08-04 Frequency: Daily Modality: individual Progress: 90%  Related Problem: Reduce overall frequency, intensity, and duration of the anxiety so that daily functioning is not impaired. Description: Learn to accept limitations in life and commit to tolerating, rather than avoiding, unpleasant emotions while accomplishing meaningful goals. Target Date: 2022-08-04 Frequency: Daily Modality: individual Progress: 80%  Related Problem: Reduce overall frequency, intensity, and duration of the anxiety so that daily functioning is not impaired. Description: Learn and implement problem-solving strategies for realistically addressing worries. Target Date: 2022-08-04 Frequency: Daily Modality: individual Progress: 80%  Related Problem: Reduce overall frequency, intensity, and duration of the anxiety so that daily functioning is not impaired. Description: Identify, challenge, and replace biased, fearful self-talk with positive, realistic, and empowering self-talk. Target Date: 2022-08-04 Frequency: Daily Modality: individual Progress: 80%  Related Problem: Reduce overall frequency, intensity, and duration of the anxiety so that daily  functioning is not impaired. Description: Describe situations, thoughts, feelings, and actions associated with anxieties and worries, their impact on functioning, and attempts to resolve them. Target Date: 2022-08-04 Frequency: Daily Modality: individual Progress: 90%  Client Response full compliance  Service Location Location, [address] [place], [place] [address]  Service Code cpt [phone]  Lifestyle change (exercise, nutrition)  Self-monitoring  Normalize/Reframe  Facilitate problem solving  Identify/label emotions  Validate/empathize  Emotion regulation skills  Self care activities  Related past to present  Session Notes F33.2, F41.1.  Goals: Resolve grief related to the loss of her mother, foster independence, resolve guilt, manage unresolved family of origin issues, resolve feelings associated with failed marriage, address issues related to establishing new romantic relationship. Also, has some conflicts with her sons that are not resolved. Goal Date is 12-23.  Patient agrees to a video phone session. She is at home and I am in my home office.  Juliann Pulse says Autumn came home because his grandmother was ill. When she passed, she was told by Curt Bears that she is being dragged through the mud. Juliann Pulse went to the visitation. She said that Gerald Stabs and Kathlee Nations are saying bad things about her and Lyrique. Juliann Pulse was very distressed by the thought of others saying untrue, negative things about her.She is feeling better and more settled now. [name] came to town as well. She says that Autumn's emotions were so fragile, she is scared that she may hurt herself at some point. The tension between Canyon and Autumn [name]) did not get along at all and there were difficult interactions in that [name] was clearly favored. Autumn is living/dressing as a woman and is now saying they will tell Kathy's father about their sexuality. They wanted to do it at Thanksgiving, but Juliann Pulse told him it would not be appropriate at  the holiday. Juliann Pulse is managing this situation well.                            [name], PhD Time 5:10-6:00p 50 min.

## 2022-07-05 ENCOUNTER — Inpatient Hospital Stay: Payer: 59

## 2022-07-05 VITALS — BP 107/67 | HR 64 | Temp 97.9°F | Resp 18

## 2022-07-05 DIAGNOSIS — D508 Other iron deficiency anemias: Secondary | ICD-10-CM

## 2022-07-05 DIAGNOSIS — D509 Iron deficiency anemia, unspecified: Secondary | ICD-10-CM | POA: Diagnosis not present

## 2022-07-05 MED ORDER — ACETAMINOPHEN 325 MG PO TABS: 650.0000 mg | ORAL_TABLET | Freq: Once | ORAL | Status: DC

## 2022-07-05 MED ORDER — DIPHENHYDRAMINE HCL 25 MG PO CAPS: 50.0000 mg | ORAL_CAPSULE | Freq: Once | ORAL | Status: DC

## 2022-07-05 MED ORDER — SODIUM CHLORIDE 0.9 % IV SOLN
200.0000 mg | Freq: Once | INTRAVENOUS | Status: AC
  Administered 2022-07-05: 200 mg via INTRAVENOUS
  Filled 2022-07-05: qty 200

## 2022-07-05 MED ORDER — SODIUM CHLORIDE 0.9% FLUSH: 10.0000 mL | Freq: Once | INTRAVENOUS | Status: DC

## 2022-07-05 MED ORDER — SODIUM CHLORIDE 0.9 % IV SOLN: INTRAVENOUS | Status: DC

## 2022-07-05 NOTE — Patient Instructions (Signed)

## 2022-07-05 NOTE — Progress Notes (Signed)
Patient did not take ordered Tylenol or Benadryl in the clinic today as she took these medications at home at 1235 before arriving to clinic.   Patient does not like her IV iron infusion to infuse quicker than 30 minutes. Rate changed on pump to reflect slower iron infusion. Pharmacy notified for future infusions.

## 2022-07-15 ENCOUNTER — Ambulatory Visit: Payer: 59

## 2022-07-18 ENCOUNTER — Ambulatory Visit (INDEPENDENT_AMBULATORY_CARE_PROVIDER_SITE_OTHER): Payer: 59 | Admitting: Psychology

## 2022-07-18 DIAGNOSIS — F411 Generalized anxiety disorder: Secondary | ICD-10-CM | POA: Diagnosis not present

## 2022-07-18 NOTE — Progress Notes (Addendum)
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 (Status: maintained) -- No Description Entered  Excessive and/or unrealistic worry that is difficult to control occurring more days than not for at least 6 months about a number of events or activities. (Status: maintained) -- No Description Entered  Feelings of hopelessness, worthlessness, or inappropriate guilt. (Status: maintained) -- No Description Entered  Medication Status compliance  Safety none  If Suicidal or Homicidal State Action Taken: unspecified  Current Risk: low Medications unspecified Objectives Related Problem: Develop healthy thinking patterns and beliefs about self, others, and the world that lead to the alleviation and help prevent the relapse of depression. Description: Learn and implement conflict resolution skills to resolve interpersonal problems. Target Date: 2022-08-04 Frequency: Daily Modality: individual Progress: 70%  Related Problem: Develop healthy thinking patterns and beliefs about self, others, and the world that lead to the alleviation and help prevent the relapse of depression. Description: Learn and implement problem-solving and decision-making skills. Target Date: 2022-08-04 Frequency: Daily Modality: individual Progress: 80%  Related Problem: Develop healthy thinking patterns and beliefs about self, others, and the world that lead to the alleviation and help prevent the relapse of depression. Description: Learn and implement behavioral strategies to overcome depression. Target Date: 2022-08-04 Frequency: Daily Modality: individual Progress: 70%  Related Problem: Develop healthy thinking patterns and beliefs about self, others, and the world that lead to the alleviation and help prevent the relapse of depression. Description: Identify and replace  thoughts and beliefs that support depression. Target Date: 2022-08-04 Frequency: Daily Modality: individual Progress: 80%  Related Problem: Develop healthy thinking patterns and beliefs about self, others, and the world that lead to the alleviation and help prevent the relapse of depression. Description: Describe current and past experiences with depression including their impact on functioning and attempts to resolve it. Target Date: 2022-08-04 Frequency: Daily Modality: individual Progress: 90%  Related Problem: Reduce overall frequency, intensity, and duration of the anxiety so that daily functioning is not impaired. Description: Maintain involvement in work, family, and social activities. Target Date: 2022-08-04 Frequency: Daily Modality: individual Progress: 90%  Related Problem: Reduce overall frequency, intensity, and duration of the anxiety so that daily functioning is not impaired. Description: Learn to accept limitations in life and commit to tolerating, rather than avoiding, unpleasant emotions while accomplishing meaningful goals. Target Date: 2022-08-04 Frequency: Daily Modality: individual Progress: 80%  Related Problem: Reduce overall frequency, intensity, and duration of the anxiety so that daily functioning is not impaired. Description: Learn and implement problem-solving strategies for realistically addressing worries. Target Date: 2022-08-04 Frequency: Daily Modality: individual Progress: 80%  Related Problem: Reduce overall frequency, intensity, and duration of the anxiety so that daily functioning is not impaired. Description: Identify, challenge, and replace biased, fearful self-talk with positive, realistic, and empowering self-talk. Target Date: 2022-08-04 Frequency: Daily Modality: individual Progress: 80%  Related Problem: Reduce overall frequency, intensity, and duration of the anxiety so that daily functioning is not impaired. Description: Describe  situations, thoughts, feelings, and actions associated with anxieties and worries, their impact on functioning, and attempts to resolve them. Target Date: 2022-08-04 Frequency: Daily Modality: individual Progress: 90%  Client Response full compliance  Service Location Location, [address] [place], [place] [address]  Service Code cpt [phone]  Lifestyle change (exercise, nutrition)  Self-monitoring  Normalize/Reframe  Facilitate problem solving  Identify/label emotions  Validate/empathize  Emotion regulation skills  Self care  activities  Related past to present  Session Notes F33.2, F41.1.  Goals: Resolve grief related to the loss of her mother, foster independence, resolve guilt, manage unresolved family of origin issues, resolve feelings associated with failed marriage, address issues related to establishing new romantic relationship. Also, has some conflicts with her sons that are not resolved. Goal Date is 12-23.  Patient agrees to a phone session. She is at home and I am in my home office.  Pamela Ray says she is feeling "rejuvenated" this holiday season. Pamela Ray is going to [place] to be with family. She is staying here to take care of his dog and spend time with Pamela Ray. She is very busy at work, which is going well. She told her father about Pamela Ray and he was surprisingly "okay". She was anticipated a much more negative reaction about his transgender status. She thinks that it is very possible that he was given a "heads up".                                [name], PhD Time 5:10-6:00p 50 min.

## 2022-07-19 ENCOUNTER — Inpatient Hospital Stay: Payer: 59 | Attending: Hematology and Oncology

## 2022-07-19 VITALS — BP 105/73 | HR 75 | Temp 97.8°F | Resp 18

## 2022-07-19 DIAGNOSIS — D509 Iron deficiency anemia, unspecified: Secondary | ICD-10-CM | POA: Diagnosis not present

## 2022-07-19 DIAGNOSIS — D508 Other iron deficiency anemias: Secondary | ICD-10-CM

## 2022-07-19 MED ORDER — SODIUM CHLORIDE 0.9 % IV SOLN: INTRAVENOUS | Status: DC

## 2022-07-19 MED ORDER — ACETAMINOPHEN 325 MG PO TABS: 650.0000 mg | ORAL_TABLET | Freq: Once | ORAL | Status: DC

## 2022-07-19 MED ORDER — DIPHENHYDRAMINE HCL 25 MG PO CAPS: 50.0000 mg | ORAL_CAPSULE | Freq: Once | ORAL | Status: DC

## 2022-07-19 MED ORDER — SODIUM CHLORIDE 0.9 % IV SOLN
200.0000 mg | Freq: Once | INTRAVENOUS | Status: AC
  Administered 2022-07-19: 200 mg via INTRAVENOUS
  Filled 2022-07-19: qty 200

## 2022-07-19 NOTE — Patient Instructions (Signed)

## 2022-08-01 ENCOUNTER — Ambulatory Visit: Payer: 59 | Admitting: Psychology

## 2022-08-15 ENCOUNTER — Ambulatory Visit (INDEPENDENT_AMBULATORY_CARE_PROVIDER_SITE_OTHER): Payer: 59 | Admitting: Psychology

## 2022-08-15 DIAGNOSIS — F411 Generalized anxiety disorder: Secondary | ICD-10-CM | POA: Diagnosis not present

## 2022-08-15 NOTE — Progress Notes (Signed)
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 (Status: maintained) -- No Description Entered  Excessive and/or unrealistic worry that is difficult to control occurring more days than not for at least 6 months about a number of events or activities. (Status: maintained) -- No Description Entered  Feelings of hopelessness, worthlessness, or inappropriate guilt. (Status: maintained) -- No Description Entered  Medication Status compliance  Safety none  If Suicidal or Homicidal State Action Taken: unspecified  Current Risk: low Medications unspecified Objectives Related Problem: Develop healthy thinking patterns and beliefs about self, others, and the world that lead to the alleviation and help prevent the relapse of depression. Description: Learn and implement conflict resolution skills to resolve interpersonal problems. Target Date: 2023-02-03 Frequency: Daily Modality: individual Progress: 70%  Related Problem: Develop healthy thinking patterns and beliefs about self, others, and the world that lead to the alleviation and help prevent the relapse of depression. Description: Learn and implement problem-solving and decision-making skills. Target Date: 2023-02-03 Frequency: Daily Modality: individual Progress: 85%  Related Problem: Develop healthy thinking patterns and beliefs about self, others, and the world that lead to the alleviation and help prevent the relapse of depression. Description: Learn and implement behavioral strategies to overcome depression. Target Date: 2023-02-03 Frequency: Daily Modality: individual Progress: 85%  Related Problem: Develop healthy thinking patterns and beliefs about self, others, and the world that lead to the alleviation and help prevent the relapse of  depression. Description: Identify and replace thoughts and beliefs that support depression. Target Date: 2023-02-03 Frequency: Daily Modality: individual Progress: 85%  Related Problem: Develop healthy thinking patterns and beliefs about self, others, and the world that lead to the alleviation and help prevent the relapse of depression. Description: Describe current and past experiences with depression including their impact on functioning and attempts to resolve it. Target Date: 2023-02-03 Frequency: Daily Modality: individual Progress: 90%  Related Problem: Reduce overall frequency, intensity, and duration of the anxiety so that daily functioning is not impaired. Description: Maintain involvement in work, family, and social activities. Target Date: 2023-02-03 Frequency: Daily Modality: individual Progress: 90%  Related Problem: Reduce overall frequency, intensity, and duration of the anxiety so that daily functioning is not impaired. Description: Learn to accept limitations in life and commit to tolerating, rather than avoiding, unpleasant emotions while accomplishing meaningful goals. Target Date: 2023-02-03 Frequency: Daily Modality: individual Progress: 85%  Related Problem: Reduce overall frequency, intensity, and duration of the anxiety so that daily functioning is not impaired. Description: Learn and implement problem-solving strategies for realistically addressing worries. Target Date: 2023-02-03 Frequency: Daily Modality: individual Progress: 80%  Related Problem: Reduce overall frequency, intensity, and duration of the anxiety so that daily functioning is not impaired. Description: Identify, challenge, and replace biased, fearful self-talk with positive, realistic, and empowering self-talk. Target Date: 2023-02-03 Frequency: Daily Modality: individual Progress: 90%  Related Problem: Reduce overall frequency, intensity, and duration of the anxiety so that daily functioning is  not impaired. Description: Describe situations, thoughts, feelings, and actions associated with anxieties and worries, their impact on functioning, and attempts to resolve them. Target Date: 2023-02-03 Frequency: Daily Modality: individual Progress: 90%  Client Response full compliance  Service Location Location, [address] [place], [place] [address]  Service Code cpt [id number]  Lifestyle change (exercise, nutrition)  Self-monitoring  Normalize/Reframe  Facilitate problem solving  Identify/label emotions  Validate/empathize  Emotion regulation skills  Self care activities  Related past to present  Session Notes F33.2, F41.1.  Goals: Resolve grief related to the loss of her mother, foster independence, resolve guilt, manage unresolved family of origin issues, resolve feelings associated with failed marriage, address issues related to establishing new romantic relationship. Also, has some conflicts with her sons that are not resolved. Goal Date is 6-24.  Patient agrees to a phone session. She is at home and I am in my home office.  Pamela Ray says she had a very quiet Christmas with just she and Pamela Ray. She finally told them that she wants a session with their therapist because she is so frustrated. She feels she has been so supportive, but gets little credit or appreciation for what she has done. Pamela Ray is easily offended and hyper-sensitive to anything that Pamela Ray says. Pamela Ray has gotten to the point of not wanting to spend time with Pamela Ray because it is so much work and dissatisfying. We talked about how to address issues with Pamela Ray in therapy.  She is also under stress because of her animals being sick.                                     Pamela Morel, PhD Time 5:10-6:00p 50 min.

## 2022-08-29 ENCOUNTER — Ambulatory Visit (INDEPENDENT_AMBULATORY_CARE_PROVIDER_SITE_OTHER): Payer: 59 | Admitting: Psychology

## 2022-08-29 DIAGNOSIS — F411 Generalized anxiety disorder: Secondary | ICD-10-CM

## 2022-08-29 NOTE — Progress Notes (Signed)
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 (Status: maintained) -- No Description Entered  Excessive and/or unrealistic worry that is difficult to control occurring more days than not for at least 6 months about a number of events or activities. (Status: maintained) -- No Description Entered  Feelings of hopelessness, worthlessness, or inappropriate guilt. (Status: maintained) -- No Description Entered  Medication Status compliance  Safety none  If Suicidal or Homicidal State Action Taken: unspecified  Current Risk: low Medications unspecified Objectives Related Problem: Develop healthy thinking patterns and beliefs about self, others, and the world that lead to the alleviation and help prevent the relapse of depression. Description: Learn and implement conflict resolution skills to resolve interpersonal problems. Target Date: 2023-02-03 Frequency: Daily Modality: individual Progress: 70%  Related Problem: Develop healthy thinking patterns and beliefs about self, others, and the world that lead to the alleviation and help prevent the relapse of depression. Description: Learn and implement problem-solving and decision-making skills. Target Date: 2023-02-03 Frequency: Daily Modality: individual Progress: 85%  Related Problem: Develop healthy thinking patterns and beliefs about self, others, and the world that lead to the alleviation and help prevent the relapse of depression. Description: Learn and implement behavioral strategies to overcome depression. Target Date: 2023-02-03 Frequency: Daily Modality: individual Progress: 85%  Related Problem: Develop healthy thinking patterns and beliefs about self, others, and the world that lead to the alleviation and help prevent the relapse of depression. Description: Identify and replace  thoughts and beliefs that support depression. Target Date: 2023-02-03 Frequency: Daily Modality: individual Progress: 85%  Related Problem: Develop healthy thinking patterns and beliefs about self, others, and the world that lead to the alleviation and help prevent the relapse of depression. Description: Describe current and past experiences with depression including their impact on functioning and attempts to resolve it. Target Date: 2023-02-03 Frequency: Daily Modality: individual Progress: 90%  Related Problem: Reduce overall frequency, intensity, and duration of the anxiety so that daily functioning is not impaired. Description: Maintain involvement in work, family, and social activities. Target Date: 2023-02-03 Frequency: Daily Modality: individual Progress: 90%  Related Problem: Reduce overall frequency, intensity, and duration of the anxiety so that daily functioning is not impaired. Description: Learn to accept limitations in life and commit to tolerating, rather than avoiding, unpleasant emotions while accomplishing meaningful goals. Target Date: 2023-02-03 Frequency: Daily Modality: individual Progress: 85%  Related Problem: Reduce overall frequency, intensity, and duration of the anxiety so that daily functioning is not impaired. Description: Learn and implement problem-solving strategies for realistically addressing worries. Target Date: 2023-02-03 Frequency: Daily Modality: individual Progress: 80%  Related Problem: Reduce overall frequency, intensity, and duration of the anxiety so that daily functioning is not impaired. Description: Identify, challenge, and replace biased, fearful self-talk with positive, realistic, and empowering self-talk. Target Date: 2023-02-03 Frequency: Daily Modality: individual Progress: 90%  Related Problem: Reduce overall frequency, intensity, and duration of the anxiety so that daily functioning is not impaired. Description: Describe  situations, thoughts, feelings, and actions associated with anxieties and worries, their impact on functioning, and attempts to resolve them. Target Date: 2023-02-03 Frequency: Daily Modality: individual Progress: 90%  Client Response full compliance  Service Location Location, [address] [place], [place] [address]  Service Code cpt [phone]  Lifestyle change (exercise, nutrition)  Self-monitoring  Normalize/Reframe  Facilitate problem solving  Identify/label emotions  Validate/empathize  Emotion regulation skills  Self care activities  Related past to present  Session Notes F33.2, F41.1.  Goals: Resolve grief related to the loss of her mother, foster independence, resolve guilt, manage unresolved family of origin issues, resolve feelings associated with failed marriage, address issues related to establishing new romantic relationship. Also, has some conflicts with her sons that are not resolved. Goal Date is 6-24.  Patient agrees to a phone session. She is at home and I am in my home office.  Pamela Ray says that she is overwhelmed by all of the medical "traumas" around her. Her dog has a serious illness, as does her close friend. In addition, [name] will have a biopsy for his prostate next week. He is confident it will be positive for prostate cancer. We talked about the need to separate out all of these situations and look at each individually. She has also had a series of practical issues that have made life more complicated, to no fault of her own. She is frustrated as she has done "all the right things", and had to deal with other's mistakes.  Pamela Ray discussed her ongoing struggle with fully accepting her trans son as a man. We talked about being more forgiving of herself and recognize that she still needs more time.                                         [name], PhD Time 5:10-6:00p 50 min.

## 2022-09-12 ENCOUNTER — Ambulatory Visit: Payer: 59 | Admitting: Psychology

## 2022-09-26 ENCOUNTER — Ambulatory Visit: Payer: 59 | Admitting: Psychology

## 2022-09-30 ENCOUNTER — Telehealth: Payer: Self-pay | Admitting: Family Medicine

## 2022-09-30 NOTE — Telephone Encounter (Signed)
Pt was called and scheduled for the re-establishment visit per the provider.

## 2022-09-30 NOTE — Telephone Encounter (Signed)
Ok to schedule appt

## 2022-09-30 NOTE — Telephone Encounter (Signed)
Per Dr Birdie Riddle it is ok to est

## 2022-09-30 NOTE — Telephone Encounter (Signed)
Pt is calling in wanting to make an appointment and her last time being seen here is 07/09/2019 reason for her not being seen sooner is because she sees her oncologist.  Pt stated that she was not aware of the 3 year newpt policy.

## 2022-10-02 ENCOUNTER — Encounter: Payer: Self-pay | Admitting: Family Medicine

## 2022-10-02 ENCOUNTER — Ambulatory Visit (INDEPENDENT_AMBULATORY_CARE_PROVIDER_SITE_OTHER): Payer: 59 | Admitting: Family Medicine

## 2022-10-02 VITALS — BP 118/72 | HR 68 | Temp 98.1°F | Resp 16 | Ht 66.1 in | Wt 155.2 lb

## 2022-10-02 DIAGNOSIS — R0789 Other chest pain: Secondary | ICD-10-CM | POA: Diagnosis not present

## 2022-10-02 DIAGNOSIS — Z23 Encounter for immunization: Secondary | ICD-10-CM | POA: Diagnosis not present

## 2022-10-02 LAB — LIPID PANEL
Cholesterol: 238 mg/dL — ABNORMAL HIGH (ref 0–200)
HDL: 68.6 mg/dL (ref >39.00)
LDL Cholesterol: 148 mg/dL — ABNORMAL HIGH (ref 0–99)
NonHDL: 169.57
Total CHOL/HDL Ratio: 3
Triglycerides: 109 mg/dL (ref 0.0–149.0)
VLDL: 21.8 mg/dL (ref 0.0–40.0)

## 2022-10-02 LAB — BASIC METABOLIC PANEL
BUN: 14 mg/dL (ref 6–23)
CO2: 31 mEq/L (ref 19–32)
Calcium: 10.4 mg/dL (ref 8.4–10.5)
Chloride: 100 mEq/L (ref 96–112)
Creatinine, Ser: 0.58 mg/dL (ref 0.40–1.20)
GFR: 102.58 mL/min (ref >60.00)
Glucose, Bld: 92 mg/dL (ref 70–99)
Potassium: 4.4 mEq/L (ref 3.5–5.1)
Sodium: 135 mEq/L (ref 135–145)

## 2022-10-02 LAB — HEPATIC FUNCTION PANEL
ALT: 16 U/L (ref 0–35)
AST: 19 U/L (ref 0–37)
Albumin: 4.4 g/dL (ref 3.5–5.2)
Alkaline Phosphatase: 84 U/L (ref 39–117)
Bilirubin, Direct: 0.1 mg/dL (ref 0.0–0.3)
Total Bilirubin: 0.5 mg/dL (ref 0.2–1.2)
Total Protein: 7.1 g/dL (ref 6.0–8.3)

## 2022-10-02 LAB — CBC WITH DIFFERENTIAL/PLATELET
Basophils Absolute: 0 10*3/uL (ref 0.0–0.1)
Basophils Relative: 0.7 % (ref 0.0–3.0)
Eosinophils Absolute: 0.1 10*3/uL (ref 0.0–0.7)
Eosinophils Relative: 2 % (ref 0.0–5.0)
HCT: 43.6 % (ref 36.0–46.0)
Hemoglobin: 14.7 g/dL (ref 12.0–15.0)
Lymphocytes Relative: 31.1 % (ref 12.0–46.0)
Lymphs Abs: 1.6 10*3/uL (ref 0.7–4.0)
MCHC: 33.6 g/dL (ref 30.0–36.0)
MCV: 83.3 fl (ref 78.0–100.0)
Monocytes Absolute: 0.6 10*3/uL (ref 0.1–1.0)
Monocytes Relative: 10.9 % (ref 3.0–12.0)
Neutro Abs: 2.9 10*3/uL (ref 1.4–7.7)
Neutrophils Relative %: 55.3 % (ref 43.0–77.0)
Platelets: 226 10*3/uL (ref 150.0–400.0)
RBC: 5.24 Mil/uL — ABNORMAL HIGH (ref 3.87–5.11)
RDW: 16.2 % — ABNORMAL HIGH (ref 11.5–15.5)
WBC: 5.2 10*3/uL (ref 4.0–10.5)

## 2022-10-02 LAB — TSH: TSH: 2.36 u[IU]/mL (ref 0.35–5.50)

## 2022-10-02 NOTE — Progress Notes (Signed)
   Subjective:    Patient ID: Pamela Ray, female    DOB: [birth year], 57 y.o.   MRN: [id number]  HPI Chest pressure- 'i don't think we're supposed to feel our organs and I've been feeling my heart'.  Pt has known MVP but says this is different.  'it feels like somebody is squeezing my heart'.  Sxs are episodic and started 3-4 weeks ago.  Has some associated SOB.  If sxs occur while talking, she has to cough.  No palpitations.  Occasional sweating but no nausea.  No radiation of pain/pressure.  Sxs resolve spontaneously after 30-60 seconds.  No particular pattern or trigger.  Pt reports high stress levels but feels that things are 'totally manageable'.  Denies increase in GERD, sxs don't improve w/ Tums.  + family hx CAD.  Last LDL 144 (2018)   Review of Systems For ROS see HPI     Objective:   Physical Exam Vitals reviewed.  Constitutional:      General: She is not in acute distress.    Appearance: She is well-developed. She is not ill-appearing.  HENT:     Head: Normocephalic and atraumatic.  Eyes:     Conjunctiva/sclera: Conjunctivae normal.     Pupils: Pupils are equal, round, and reactive to light.  Neck:     Thyroid: No thyromegaly.  Cardiovascular:     Rate and Rhythm: Normal rate and regular rhythm.     Heart sounds: Normal heart sounds. No murmur heard. Pulmonary:     Effort: Pulmonary effort is normal. No respiratory distress.     Breath sounds: Normal breath sounds.  Abdominal:     General: There is no distension.     Palpations: Abdomen is soft.     Tenderness: There is no abdominal tenderness.  Musculoskeletal:     Cervical back: Normal range of motion and neck supple.  Lymphadenopathy:     Cervical: No cervical adenopathy.  Skin:    General: Skin is warm and dry.  Neurological:     Mental Status: She is alert and oriented to person, place, and time.  Psychiatric:        Behavior: Behavior normal.           Assessment & Plan:   L chest pressure- new.   Pt reports she is having sxs multiple times daily.  These are lasting 30-60 seconds before spontaneously resolving.  Reports stress level is high but manageable.  Does not feel like MVP or GERD.  EKG WNL.  Pt has not had lipids checked since 2018 and they were high at that time.  + family hx CAD.  Will check labs to risk stratify.  Start '81mg'$  ASA and refer to Cards.  Reviewed supportive care and red flags that should prompt return.  Pt expressed understanding and is in agreement w/ plan.

## 2022-10-02 NOTE — Patient Instructions (Signed)
We'll notify you of your lab results and determine the next steps We'll call you to schedule your cardiology appt START a once daily 41m enteric coated aspirin (to protect your stomach) If symptoms change or worsen before your upcoming cardiology appt, please go to the ER for evaluation Call with any questions or concerns Hang in there!!

## 2022-10-03 ENCOUNTER — Encounter: Payer: Self-pay | Admitting: Family Medicine

## 2022-10-03 ENCOUNTER — Telehealth: Payer: Self-pay

## 2022-10-03 ENCOUNTER — Other Ambulatory Visit: Payer: Self-pay

## 2022-10-03 DIAGNOSIS — E7800 Pure hypercholesterolemia, unspecified: Secondary | ICD-10-CM

## 2022-10-03 MED ORDER — ROSUVASTATIN CALCIUM 10 MG PO TABS: 10.0000 mg | ORAL_TABLET | Freq: Every day | ORAL | 6 refills | Status: DC

## 2022-10-03 NOTE — Telephone Encounter (Signed)
Pt aware of lab results .Crestor sent to phamracy and order for repeat liver function is in and lab only visit has been made

## 2022-10-03 NOTE — Telephone Encounter (Signed)
-----   Message from Midge Minium, MD sent at 10/03/2022  7:39 AM EST ----- Labs look good w/ exception of total cholesterol and LDL (bad cholesterol).  This will improve w/ healthy diet and regular exercise.  Given your family history of heart disease and stroke, we will start low dose Crestor '10mg'$  nightly (#30, 6 refills).  We will have you come back for a lab only visit in 6 weeks to recheck liver functions b/c this is how the medication is metabolized (dx hyperlipidemia)

## 2022-10-03 NOTE — Telephone Encounter (Signed)
Pt has a question about some lab levels

## 2022-10-08 ENCOUNTER — Other Ambulatory Visit: Payer: Self-pay | Admitting: Family

## 2022-10-08 DIAGNOSIS — E7801 Familial hypercholesterolemia: Secondary | ICD-10-CM

## 2022-10-08 DIAGNOSIS — M818 Other osteoporosis without current pathological fracture: Secondary | ICD-10-CM

## 2022-10-08 DIAGNOSIS — M810 Age-related osteoporosis without current pathological fracture: Secondary | ICD-10-CM

## 2022-10-08 DIAGNOSIS — D509 Iron deficiency anemia, unspecified: Secondary | ICD-10-CM

## 2022-10-08 DIAGNOSIS — R002 Palpitations: Secondary | ICD-10-CM

## 2022-10-08 DIAGNOSIS — Z8249 Family history of ischemic heart disease and other diseases of the circulatory system: Secondary | ICD-10-CM

## 2022-10-08 DIAGNOSIS — E559 Vitamin D deficiency, unspecified: Secondary | ICD-10-CM

## 2022-10-08 DIAGNOSIS — F064 Anxiety disorder due to known physiological condition: Secondary | ICD-10-CM

## 2022-10-08 DIAGNOSIS — R739 Hyperglycemia, unspecified: Secondary | ICD-10-CM

## 2022-10-08 DIAGNOSIS — D0512 Intraductal carcinoma in situ of left breast: Secondary | ICD-10-CM

## 2022-10-08 DIAGNOSIS — D508 Other iron deficiency anemias: Secondary | ICD-10-CM

## 2022-10-10 ENCOUNTER — Ambulatory Visit: Payer: 59 | Admitting: Psychology

## 2022-10-17 NOTE — Progress Notes (Deleted)
CARDIOLOGY CONSULT NOTE       Patient ID: Pamela Ray MRN: [id number] DOB/AGE: [birth year] 55 y.o.  Admit date: (Not on file) Referring Physician: Birdie Riddle Primary Physician: Midge Minium, MD Primary Cardiologist: New Reason for Consultation: Chest pain  Active Problems:   * No active hospital problems. *   HPI:  57 y.o. referred by Dr Birdie Riddle for chest pain. History of anemia, IBS, migraines, HLD , breast cancer and ? MVP.  Only TTE in Epic 2018 was normal with no MVP Seen in office by primary 10/02/22 complaining of chest pain and "feeling her heart " Feels like somebody is squeezing her heart Started a month ago Associated with coughing and dyspnea Resolve on there own after a minute No improvement with Tums. 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 history   Personal history of breast cancer 09/26/2021   Raynaud's phenomenon (by history or observed) 10/11/2011   Seasonal allergies     Family History  Problem Relation Age of Onset   Rheum arthritis Mother    Lung cancer Mother        small cell; d. 54   Skin cancer Father        ? BCC; surgery only   Breast cancer Paternal Aunt        dx mid 47s   Rheum arthritis Maternal Grandmother    Stroke Maternal Grandmother    Hypertension Maternal Grandmother    Diabetes Maternal Grandmother    Stroke Maternal Grandfather    Hypertension Maternal Grandfather    Diabetes Maternal Grandfather    Rheum arthritis Paternal Grandmother    Stroke Paternal Grandmother     Hypertension Paternal Grandmother    Diabetes Paternal Grandmother    Uterine cancer Paternal Grandmother        or other GYN cancer; dx unknown age   Stroke Paternal Grandfather    Hypertension Paternal Grandfather    Diabetes Paternal Grandfather    Heart disease Other        pgm,pgf,mgm,mgf   Ovarian cancer Other        PGM's sister; d. early 47s    Social History   Socioeconomic History   Marital status: Significant Other    Spouse name: Not on file   Number of children: 2   Years of education: Not on file   Highest education level: Not on file  Occupational History   Occupation: HUMAN RESOURCE    Employer: [employer]  Tobacco Use   Smoking status: Never   Smokeless tobacco: Never   Tobacco comments:    never used tobacco  Vaping Use   Vaping Use: Never used  Substance and Sexual Activity   Alcohol use: No    Alcohol/week: 0.0 standard drinks of alcohol   Drug use: No   Sexual activity: Yes    Birth control/protection: None  Other Topics Concern   Not on file  Social History Narrative   Not on  file   Social Determinants of Health   Financial Resource Strain: Not on file  Food Insecurity: Not on file  Transportation Needs: Not on file  Physical Activity: Not on file  Stress: Not on file  Social Connections: Not on file  Intimate Partner Violence: Not on file    Past Surgical History:  Procedure Laterality Date   HERNIA REPAIR     double 1999   MASTECTOMY, PARTIAL     TONSILLECTOMY        Current Outpatient Medications:    rosuvastatin (CRESTOR) 10 MG tablet, Take 1 tablet (10 mg total) by mouth daily., Disp: 30 tablet, Rfl: 6   Boric Acid GRAN, Insert 1 capsule in vagina every night x 14 days then every Monday and Thursday night indefinitely, Disp: , Rfl:    BOTOX 100 units SOLR injection, Inject 100 Units into the muscle once., Disp: , Rfl:    Botulinum Toxin Type A, Cosm, 100 units SOLR, Every 3 months, Disp: , Rfl:    chlorproMAZINE  (THORAZINE) 25 MG tablet, , Disp: , Rfl:    cloNIDine (CATAPRES) 0.1 MG tablet, Take 0.1 mg by mouth at bedtime., Disp: , Rfl:    estradiol (ESTRACE) 0.1 MG/GM vaginal cream, Place 1 g vaginally 2 (two) times a week., Disp: , Rfl:    hydrocortisone 2.5 % ointment, , Disp: , Rfl:    ibuprofen (ADVIL,MOTRIN) 200 MG tablet, Take 200 mg by mouth every 6 (six) hours as needed., Disp: , Rfl:    SUMAtriptan (IMITREX) 100 MG tablet, Take by mouth., Disp: , Rfl:    Vitamin D, Ergocalciferol, (DRISDOL) 1.25 MG (50000 UNIT) CAPS capsule, TAKE 1 CAPSULE BY MOUTH ONCE  WEEKLY, Disp: 13 capsule, Rfl: 3    Physical Exam: There were no vitals taken for this visit.    Affect appropriate Healthy:  appears stated age 33: normal Neck supple with no adenopathy JVP normal no bruits no thyromegaly Lungs clear with no wheezing and good diaphragmatic motion Heart:  S1/S2 no murmur, no rub, gallop or click PMI normal Abdomen: benighn, BS positve, no tenderness, no AAA no bruit.  No HSM or HJR Distal pulses intact with no bruits No edema Neuro non-focal Skin warm and dry No muscular weakness   Labs:   Lab Results  Component Value Date   WBC 5.2 10/02/2022   HGB 14.7 10/02/2022   HCT 43.6 10/02/2022   MCV 83.3 10/02/2022   PLT 226.0 10/02/2022   No results for input(s): "NA", "K", "CL", "CO2", "BUN", "CREATININE", "CALCIUM", "PROT", "BILITOT", "ALKPHOS", "ALT", "AST", "GLUCOSE" in the last 168 hours.  Invalid input(s): "LABALBU" Lab Results  Component Value Date   TROPONINI <0.03 05/19/2015    Lab Results  Component Value Date   CHOL 238 (H) 10/02/2022   CHOL 229 (H) 12/04/2016   CHOL 193 09/15/2015   Lab Results  Component Value Date   HDL 68.60 10/02/2022   HDL 62 12/04/2016   HDL 55 09/15/2015   Lab Results  Component Value Date   LDLCALC 148 (H) 10/02/2022   LDLCALC 144 (H) 12/04/2016   LDLCALC 119 (H) 09/15/2015   Lab Results  Component Value Date   TRIG 109.0 10/02/2022    TRIG 114 12/04/2016   TRIG 96 09/15/2015   Lab Results  Component Value Date   CHOLHDL 3 10/02/2022   CHOLHDL 3.7 12/04/2016   CHOLHDL 3.5 09/15/2015   Lab Results  Component Value Date   LDLDIRECT 135.0 09/05/2011  Radiology: No results found.  EKG: from 2/14 at primary not available ***   ASSESSMENT AND PLAN:   Chest pain: atypical no acute ECG changes shared decision making feel cardiac CTA best way to risk stratify lopressor 50 mg 2 hours before Renal function normal 10/02/22 HLD:  Crestor started 10/03/22 repeat labs with primary in 3 months Assess target for LDL with calcium score during CTA MVP:  historical diagnosis no murmur on exam and prior TTE 2018 with no MVP or MR Migraines:  as needed Imitrex   Cardiac CTA Lopressor 50 mg 2 hours before  F/U PRN if testing normal   Signed: Jenkins [place] 10/17/2022, 4:36 PM

## 2022-10-23 ENCOUNTER — Ambulatory Visit: Payer: 59 | Attending: Cardiology | Admitting: Cardiology

## 2022-10-23 ENCOUNTER — Encounter: Payer: Self-pay | Admitting: Cardiology

## 2022-10-23 VITALS — BP 102/78 | HR 70 | Ht 66.0 in | Wt 156.8 lb

## 2022-10-23 DIAGNOSIS — R072 Precordial pain: Secondary | ICD-10-CM | POA: Diagnosis not present

## 2022-10-23 DIAGNOSIS — R0602 Shortness of breath: Secondary | ICD-10-CM

## 2022-10-23 DIAGNOSIS — R002 Palpitations: Secondary | ICD-10-CM | POA: Diagnosis not present

## 2022-10-23 MED ORDER — METOPROLOL TARTRATE 50 MG PO TABS: ORAL_TABLET | ORAL | 0 refills | Status: DC

## 2022-10-23 NOTE — Progress Notes (Signed)
Cardiology Office Note:    Date:  10/23/2022   ID:  Estevan Oaks, DOB 01-Jun-1966, MRN [id number]  PCP:  Midge Minium, MD   [place] Providers Cardiologist:  None     Referring MD: Midge Minium, MD    History of Present Illness:    Pamela Ray is a 57 y.o. female here for the evaluation of chest pain at the request of Dr. Birdie Riddle.  She had been feeling her heart.  Has known mitral valve prolapse. Pressure like.   Feels like a squeezing sensation.  Some associated shortness of breath noted.  Occasional sweating.  Seems to resolve after about 1 minute.  Stress can trigger.  Has family history of coronary disease.  Prior LDL 144. Apple watch.   Different from prior palpitations.   No TOB GP all had heart issues, [place]. M/F no CAD  Has also been perimenopausal.  Has seen vein specialist as well.  Note below.  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 history   Personal history of breast cancer 09/26/2021   Raynaud's phenomenon (by history or observed) 10/11/2011   Seasonal allergies     Past Surgical History:  Procedure Laterality Date   HERNIA REPAIR     double 1999   MASTECTOMY, PARTIAL     TONSILLECTOMY      Current Medications: Current Meds  Medication Sig   Boric Acid GRAN Insert 1 capsule in vagina every night x 14 days then every Monday and Thursday night indefinitely   BOTOX 100 units SOLR injection Inject 100 Units into the muscle once.   Botulinum Toxin Type A, Cosm, 100 units SOLR Every 3 months   chlorproMAZINE (THORAZINE) 25 MG tablet    cloNIDine (CATAPRES) 0.1  MG tablet Take 0.1 mg by mouth at bedtime.   estradiol (ESTRACE) 0.1 MG/GM vaginal cream Place 1 g vaginally 2 (two) times a week.   hydrocortisone 2.5 % ointment    ibuprofen (ADVIL,MOTRIN) 200 MG tablet Take 200 mg by mouth every 6 (six) hours as needed.   metoprolol tartrate (LOPRESSOR) 50 MG tablet Take one (1) tablet by mouth ( 50 mg) 2 hours prior to CT scan.   rosuvastatin (CRESTOR) 10 MG tablet Take 1 tablet (10 mg total) by mouth daily.   SUMAtriptan (IMITREX) 100 MG tablet Take by mouth.   Vitamin D, Ergocalciferol, (DRISDOL) 1.25 MG (50000 UNIT) CAPS capsule TAKE 1 CAPSULE BY MOUTH ONCE  WEEKLY     Allergies:   Amoxicillin, Cephalexin, Penicillins, and Venofer [iron sucrose]   Social History   Socioeconomic History   Marital status: Significant Other    Spouse name: Not on file   Number of children: 2   Years of education: Not on file   Highest education level: Not on file  Occupational History   Occupation: HUMAN RESOURCE    Employer: [employer]  Tobacco Use   Smoking status: Never   Smokeless tobacco: Never   Tobacco comments:    never used tobacco  Vaping Use   Vaping Use:  Never used  Substance and Sexual Activity   Alcohol use: No    Alcohol/week: 0.0 standard drinks of alcohol   Drug use: No   Sexual activity: Yes    Birth control/protection: None  Other Topics Concern   Not on file  Social History Narrative   Not on file   Social Determinants of Health   Financial Resource Strain: Not on file  Food Insecurity: Not on file  Transportation Needs: Not on file  Physical Activity: Not on file  Stress: Not on file  Social Connections: Not on file     Family History: The patient's family history includes Breast cancer in her paternal aunt; Diabetes in her maternal grandfather, maternal grandmother, paternal grandfather, and paternal grandmother; Heart disease in an other family member; Hypertension in her maternal grandfather, maternal grandmother,  paternal grandfather, and paternal grandmother; Lung cancer in her mother; Ovarian cancer in an other family member; Rheum arthritis in her maternal grandmother, mother, and paternal grandmother; Skin cancer in her father; Stroke in her maternal grandfather, maternal grandmother, paternal grandfather, and paternal grandmother; Uterine cancer in her paternal grandmother.  ROS:   Please see the history of present illness.     All other systems reviewed and are negative.  EKGs/Labs/Other Studies Reviewed:    The following studies were reviewed today: Prior office notes, lab work  EKG:  Prior ECG not able to be viewed.  Described as normal.  Recent Labs: 10/02/2022: ALT 16; BUN 14; Creatinine, Ser 0.58; Hemoglobin 14.7; Platelets 226.0; Potassium 4.4; Sodium 135; TSH 2.36  Recent Lipid Panel    Component Value Date/Time   CHOL 238 (H) 10/02/2022 1050   CHOL 229 (H) 12/04/2016 0747   TRIG 109.0 10/02/2022 1050   TRIG 114 12/04/2016 0747   HDL 68.60 10/02/2022 1050   HDL 62 12/04/2016 0747   CHOLHDL 3 10/02/2022 1050   VLDL 21.8 10/02/2022 1050   LDLCALC 148 (H) 10/02/2022 1050   LDLCALC 144 (H) 12/04/2016 0747   LDLDIRECT 135.0 09/05/2011 0914     Risk Assessment/Calculations:               Physical Exam:    VS:  BP 102/78   Pulse 70   Ht '5\' 6"'$  (1.676 m)   Wt 156 lb 12.8 oz (71.1 kg)   SpO2 98%   BMI 25.31 kg/m     Wt Readings from Last 3 Encounters:  10/23/22 156 lb 12.8 oz (71.1 kg)  10/02/22 155 lb 4 oz (70.4 kg)  06/07/22 149 lb (67.6 kg)     GEN:  Well nourished, well developed in no acute distress HEENT: Normal NECK: No JVD; No carotid bruits LYMPHATICS: No lymphadenopathy CARDIAC: RRR, no murmurs, rubs, gallops RESPIRATORY:  Clear to auscultation without rales, wheezing or rhonchi  ABDOMEN: Soft, non-tender, non-distended MUSCULOSKELETAL:  No edema; No deformity  SKIN: Warm and dry NEUROLOGIC:  Alert and oriented x 3 PSYCHIATRIC:  Normal affect    ASSESSMENT:    1. Precordial chest pain   2. Shortness of breath   3. Palpitations    PLAN:    In order of problems listed above:  Precordial chest pain -We will go ahead and check a coronary CT scan.  Squeezing-like chest sensation.  Could be anginal equivalent.  Risk factors include hyperlipidemia.  Family history, grandmother died of heart attack.  Differential includes musculoskeletal/GERD.  We will give her 50 mg metoprolol tartrate prior to study.  Hydrate well.  Avoid clonidine.  She is  not taking currently.  Shortness of breath/palpitations/prior diagnosis of mitral valve prolapse -We will check an echocardiogram with bubble study.  Vein specialist had requested a bubble study to rule out significant PFO given her shortness of breath, palpitations as well as chronic frequent migraine.  Also if they are going to manipulate the vein they do want a make sure that she does not have a shunt present.  We will order echo with bubble study.  Palpitations - Prior Holter monitor utilized.  No significant arrhythmias detected but she does feel palpitations or occasional skips.  These are likely PVCs or PACs, premature ventricular contractions.            Medication Adjustments/Labs and Tests Ordered: Current medicines are reviewed at length with the patient today.  Concerns regarding medicines are outlined above.  Orders Placed This Encounter  Procedures   CT CORONARY MORPH W/CTA COR W/SCORE W/CA W/CM &/OR WO/CM   Basic Metabolic Panel (BMET)   ECHOCARDIOGRAM COMPLETE BUBBLE STUDY   Meds ordered this encounter  Medications   metoprolol tartrate (LOPRESSOR) 50 MG tablet    Sig: Take one (1) tablet by mouth ( 50 mg) 2 hours prior to CT scan.    Dispense:  1 tablet    Refill:  0    Patient Instructions  Medication Instructions:   Your physician recommends that you continue on your current medications as directed. Please refer to the Current Medication list given to you  today.   *If you need a refill on your cardiac medications before your next appointment, please call your pharmacy*   Lab Work:  TODAY!!!! BMET  If you have labs (blood work) drawn today and your tests are completely normal, you will receive your results only by: [place] (if you have MyChart) OR A paper copy in the mail If you have any lab test that is abnormal or we need to change your treatment, we will call you to review the results.   Testing/Procedures:  Your physician has requested that you have an echocardiogram. Echocardiography is a painless test that uses sound waves to create images of your heart. It provides your doctor with information about the size and shape of your heart and how well your heart's chambers and valves are working. This procedure takes approximately one hour. There are no restrictions for this procedure. Please do NOT wear perfume or lotions (deodorant is allowed). Please arrive 15 minutes prior to your appointment time.    Your cardiac CT will be scheduled at one of the below locations:   [facility] [address] [place], [place] [address] [phone]  If scheduled at [facility], please arrive at the [facility] and Children's Entrance (Entrance C2) of [facility] 30 minutes prior to test start time. You can use the FREE valet parking offered at entrance C (encouraged to control the heart rate for the test)  Proceed to the [facility] Radiology Department (first floor) to check-in and test prep.  All radiology patients and guests should use entrance C2 at [facility] accessed from [facility], even though the hospital's physical address listed is [address]    Please follow these instructions carefully (unless otherwise directed):  On the Night Before the Test: Be sure to Drink plenty of water. Do not consume any caffeinated/decaffeinated beverages or chocolate 12 hours  prior to your test. Do not take any antihistamines 12 hours prior to your test.  On the Day  of the Test: Drink plenty of water until 1 hour prior to the test. Do not eat any food 1 hour prior to test. You may take your regular medications prior to the test.  Take metoprolol (Lopressor) one (1) tablet by mouth ( 50 mg)  two hours prior to test. FEMALES- please wear underwire-free bra if available, avoid dresses & tight clothing       After the Test: Drink plenty of water. After receiving IV contrast, you may experience a mild flushed feeling. This is normal. On occasion, you may experience a mild rash up to 24 hours after the test. This is not dangerous. If this occurs, you can take Benadryl 25 mg and increase your fluid intake. If you experience trouble breathing, this can be serious. If it is severe call 911 IMMEDIATELY. If it is mild, please call our office.  We will call to schedule your test 2-4 weeks out understanding that some insurance companies will need an authorization prior to the service being performed.   For non-scheduling related questions, please contact the cardiac imaging nurse navigator should you have any questions/concerns: Marchia Bond, Cardiac Imaging Nurse Navigator Gordy Clement, Cardiac Imaging Nurse Navigator [facility] Direct Office Dial: [phone]   For scheduling needs, including cancellations and rescheduling, please call [place], [phone].  Follow-Up: At [facility], you and your health needs are our priority.  As part of our continuing mission to provide you with exceptional heart care, we have created designated Provider Care Teams.  These Care Teams include your primary Cardiologist (physician) and Advanced Practice Providers (APPs -  Physician Assistants and Nurse Practitioners) who all work together to provide you with the care you need, when you need it.  We recommend signing up for the patient portal  called "MyChart".  Sign up information is provided on this After Visit Summary.  MyChart is used to connect with patients for Virtual Visits (Telemedicine).  Patients are able to view lab/test results, encounter notes, upcoming appointments, etc.  Non-urgent messages can be sent to your provider as well.   To learn more about what you can do with MyChart, go to [email or web address].    Your next appointment:   As needed depending on test results.         Signed, Candee Furbish, MD  10/23/2022 4:58 PM    [place]

## 2022-10-23 NOTE — Patient Instructions (Signed)
Medication Instructions:   Your physician recommends that you continue on your current medications as directed. Please refer to the Current Medication list given to you today.   *If you need a refill on your cardiac medications before your next appointment, please call your pharmacy*   Lab Work:  TODAY!!!! BMET  If you have labs (blood work) drawn today and your tests are completely normal, you will receive your results only by: [place] (if you have MyChart) OR A paper copy in the mail If you have any lab test that is abnormal or we need to change your treatment, we will call you to review the results.   Testing/Procedures:  Your physician has requested that you have an echocardiogram. Echocardiography is a painless test that uses sound waves to create images of your heart. It provides your doctor with information about the size and shape of your heart and how well your heart's chambers and valves are working. This procedure takes approximately one hour. There are no restrictions for this procedure. Please do NOT wear perfume or lotions (deodorant is allowed). Please arrive 15 minutes prior to your appointment time.    Your cardiac CT will be scheduled at one of the below locations:   [facility] [address] [place], [place] [address] [phone]  If scheduled at [facility], please arrive at the [facility] Entrance (Entrance C2) of [facility] 30 minutes prior to test start time. You can use the FREE valet parking offered at entrance C (encouraged to control the heart rate for the test)  Proceed to the [facility] Radiology Department (first floor) to check-in and test prep.  All radiology patients and guests should use entrance C2 at [facility], accessed from [facility], even though the hospital's physical address listed is [address].    Please follow these instructions carefully (unless  otherwise directed):  On the Night Before the Test: Be sure to Drink plenty of water. Do not consume any caffeinated/decaffeinated beverages or chocolate 12 hours prior to your test. Do not take any antihistamines 12 hours prior to your test.  On the Day of the Test: Drink plenty of water until 1 hour prior to the test. Do not eat any food 1 hour prior to test. You may take your regular medications prior to the test.  Take metoprolol (Lopressor) one (1) tablet by mouth ( 50 mg)  two hours prior to test. FEMALES- please wear underwire-free bra if available, avoid dresses & tight clothing       After the Test: Drink plenty of water. After receiving IV contrast, you may experience a mild flushed feeling. This is normal. On occasion, you may experience a mild rash up to 24 hours after the test. This is not dangerous. If this occurs, you can take Benadryl 25 mg and increase your fluid intake. If you experience trouble breathing, this can be serious. If it is severe call 911 IMMEDIATELY. If it is mild, please call our office.  We will call to schedule your test 2-4 weeks out understanding that some insurance companies will need an authorization prior to the service being performed.   For non-scheduling related questions, please contact the cardiac imaging nurse navigator should you have any questions/concerns: Marchia Bond, Cardiac Imaging Nurse Navigator Gordy Clement, Cardiac Imaging Nurse Navigator [facility] Direct Office Dial: [phone]   For scheduling needs, including cancellations and rescheduling, please call [place],  [phone].  Follow-Up: At [facility], you and your health needs are our priority.  As part of our continuing mission to provide you with exceptional heart care, we have created designated Provider Care Teams.  These Care Teams include your primary Cardiologist (physician) and Advanced Practice Providers (APPs -  Physician  Assistants and Nurse Practitioners) who all work together to provide you with the care you need, when you need it.  We recommend signing up for the patient portal called "MyChart".  Sign up information is provided on this After Visit Summary.  MyChart is used to connect with patients for Virtual Visits (Telemedicine).  Patients are able to view lab/test results, encounter notes, upcoming appointments, etc.  Non-urgent messages can be sent to your provider as well.   To learn more about what you can do with MyChart, go to [email or web address].    Your next appointment:   As needed depending on test results.

## 2022-10-24 ENCOUNTER — Ambulatory Visit: Payer: 59 | Admitting: Psychology

## 2022-10-24 LAB — BASIC METABOLIC PANEL
BUN/Creatinine Ratio: 22 (ref 9–23)
BUN: 12 mg/dL (ref 6–24)
CO2: 24 mmol/L (ref 20–29)
Calcium: 9.9 mg/dL (ref 8.7–10.2)
Chloride: 103 mmol/L (ref 96–106)
Creatinine, Ser: 0.54 mg/dL — ABNORMAL LOW (ref 0.57–1.00)
Glucose: 95 mg/dL (ref 70–99)
Potassium: 4 mmol/L (ref 3.5–5.2)
Sodium: 143 mmol/L (ref 134–144)
eGFR: 109 mL/min/{1.73_m2} (ref >59)

## 2022-10-30 ENCOUNTER — Ambulatory Visit: Payer: 59 | Admitting: Cardiovascular Disease

## 2022-10-30 ENCOUNTER — Telehealth (HOSPITAL_COMMUNITY): Payer: Self-pay | Admitting: *Deleted

## 2022-10-30 NOTE — Telephone Encounter (Signed)
Attempted to call patient regarding upcoming cardiac CT appointment. °Left message on voicemail with name and callback number ° °Offie Pickron RN Navigator Cardiac Imaging °[facility] °[phone] Office °[phone] Cell ° °

## 2022-10-31 ENCOUNTER — Ambulatory Visit (HOSPITAL_COMMUNITY)
Admission: RE | Admit: 2022-10-31 | Discharge: 2022-10-31 | Disposition: A | Discharge status: Home / Self Care | Payer: 59 | Source: Ambulatory Visit | Attending: Cardiology | Admitting: Cardiology

## 2022-10-31 DIAGNOSIS — R0602 Shortness of breath: Secondary | ICD-10-CM | POA: Diagnosis present

## 2022-10-31 DIAGNOSIS — R072 Precordial pain: Secondary | ICD-10-CM

## 2022-10-31 DIAGNOSIS — R002 Palpitations: Secondary | ICD-10-CM | POA: Insufficient documentation

## 2022-10-31 MED ORDER — NITROGLYCERIN 0.4 MG SL SUBL: 0.8000 mg | SUBLINGUAL_TABLET | Freq: Once | SUBLINGUAL | Status: AC

## 2022-10-31 MED ORDER — IOHEXOL 350 MG/ML SOLN
100.0000 mL | Freq: Once | INTRAVENOUS | Status: AC | PRN
  Administered 2022-10-31: 100 mL via INTRAVENOUS

## 2022-10-31 MED ORDER — NITROGLYCERIN 0.4 MG SL SUBL
SUBLINGUAL_TABLET | SUBLINGUAL | Status: AC
  Administered 2022-10-31: 0.8 mg via SUBLINGUAL
  Filled 2022-10-31: qty 2

## 2022-11-07 ENCOUNTER — Ambulatory Visit: Payer: 59 | Admitting: Psychology

## 2022-11-13 ENCOUNTER — Other Ambulatory Visit (INDEPENDENT_AMBULATORY_CARE_PROVIDER_SITE_OTHER): Payer: 59

## 2022-11-13 DIAGNOSIS — E7800 Pure hypercholesterolemia, unspecified: Secondary | ICD-10-CM

## 2022-11-13 LAB — HEPATIC FUNCTION PANEL
ALT: 20 U/L (ref 0–35)
AST: 22 U/L (ref 0–37)
Albumin: 4.3 g/dL (ref 3.5–5.2)
Alkaline Phosphatase: 92 U/L (ref 39–117)
Bilirubin, Direct: 0.2 mg/dL (ref 0.0–0.3)
Total Bilirubin: 0.7 mg/dL (ref 0.2–1.2)
Total Protein: 7 g/dL (ref 6.0–8.3)

## 2022-11-19 ENCOUNTER — Telehealth: Payer: Self-pay

## 2022-11-19 NOTE — Telephone Encounter (Signed)
-----   Message from Midge Minium, MD sent at 11/18/2022  6:35 PM EDT ----- Normal liver functions.  Great news!  No changes at this time

## 2022-11-19 NOTE — Telephone Encounter (Signed)
Informed pt of lab results  

## 2022-11-21 ENCOUNTER — Ambulatory Visit: Payer: 59 | Admitting: Psychology

## 2022-11-21 ENCOUNTER — Other Ambulatory Visit: Payer: Self-pay

## 2022-11-21 MED ORDER — ROSUVASTATIN CALCIUM 10 MG PO TABS: 10.0000 mg | ORAL_TABLET | Freq: Every day | ORAL | 1 refills | Status: DC

## 2022-11-29 ENCOUNTER — Inpatient Hospital Stay: Payer: 59 | Attending: Hematology & Oncology

## 2022-11-29 DIAGNOSIS — Z923 Personal history of irradiation: Secondary | ICD-10-CM | POA: Insufficient documentation

## 2022-11-29 DIAGNOSIS — D509 Iron deficiency anemia, unspecified: Secondary | ICD-10-CM | POA: Diagnosis not present

## 2022-11-29 DIAGNOSIS — D0512 Intraductal carcinoma in situ of left breast: Secondary | ICD-10-CM | POA: Diagnosis present

## 2022-11-29 LAB — CBC WITH DIFFERENTIAL (CANCER CENTER ONLY)
Abs Immature Granulocytes: 0.01 10*3/uL (ref 0.00–0.07)
Basophils Absolute: 0 10*3/uL (ref 0.0–0.1)
Basophils Relative: 0 %
Eosinophils Absolute: 0.1 10*3/uL (ref 0.0–0.5)
Eosinophils Relative: 3 %
HCT: 40.2 % (ref 36.0–46.0)
Hemoglobin: 13.1 g/dL (ref 12.0–15.0)
Immature Granulocytes: 0 %
Lymphocytes Relative: 31 %
Lymphs Abs: 1.6 10*3/uL (ref 0.7–4.0)
MCH: 27.9 pg (ref 26.0–34.0)
MCHC: 32.6 g/dL (ref 30.0–36.0)
MCV: 85.7 fL (ref 80.0–100.0)
Monocytes Absolute: 0.6 10*3/uL (ref 0.1–1.0)
Monocytes Relative: 12 %
Neutro Abs: 2.9 10*3/uL (ref 1.7–7.7)
Neutrophils Relative %: 54 %
Platelet Count: 220 10*3/uL (ref 150–400)
RBC: 4.69 MIL/uL (ref 3.87–5.11)
RDW: 14.2 % (ref 11.5–15.5)
WBC Count: 5.3 10*3/uL (ref 4.0–10.5)
nRBC: 0 % (ref 0.0–0.2)

## 2022-11-29 LAB — IRON AND IRON BINDING CAPACITY (CC-WL,HP ONLY)
Iron: 78 ug/dL (ref 28–170)
Saturation Ratios: 16 % (ref 10.4–31.8)
TIBC: 497 ug/dL — ABNORMAL HIGH (ref 250–450)
UIBC: 419 ug/dL (ref 148–442)

## 2022-11-29 LAB — CMP (CANCER CENTER ONLY)
ALT: 22 U/L (ref 0–44)
AST: 26 U/L (ref 15–41)
Albumin: 4.2 g/dL (ref 3.5–5.0)
Alkaline Phosphatase: 84 U/L (ref 38–126)
Anion gap: 9 (ref 5–15)
BUN: 13 mg/dL (ref 6–20)
CO2: 29 mmol/L (ref 22–32)
Calcium: 9.8 mg/dL (ref 8.9–10.3)
Chloride: 104 mmol/L (ref 98–111)
Creatinine: 0.65 mg/dL (ref 0.44–1.00)
GFR, Estimated: >60 mL/min (ref >60)
Glucose, Bld: 77 mg/dL (ref 70–99)
Potassium: 4.2 mmol/L (ref 3.5–5.1)
Sodium: 142 mmol/L (ref 135–145)
Total Bilirubin: 0.7 mg/dL (ref 0.3–1.2)
Total Protein: 6.9 g/dL (ref 6.5–8.1)

## 2022-11-29 LAB — FERRITIN: Ferritin: 11 ng/mL (ref 11–307)

## 2022-12-03 ENCOUNTER — Ambulatory Visit (HOSPITAL_COMMUNITY): Payer: 59 | Attending: Internal Medicine

## 2022-12-03 DIAGNOSIS — R002 Palpitations: Secondary | ICD-10-CM | POA: Insufficient documentation

## 2022-12-03 DIAGNOSIS — R072 Precordial pain: Secondary | ICD-10-CM | POA: Insufficient documentation

## 2022-12-03 DIAGNOSIS — R0602 Shortness of breath: Secondary | ICD-10-CM | POA: Diagnosis present

## 2022-12-03 LAB — ECHOCARDIOGRAM COMPLETE BUBBLE STUDY
Area-P 1/2: 3.43 cm2
S' Lateral: 2.6 cm

## 2022-12-03 MED ORDER — PERFLUTREN LIPID MICROSPHERE
2.0000 mL | INTRAVENOUS | Status: AC | PRN
  Administered 2022-12-03: 2 mL via INTRAVENOUS

## 2022-12-05 ENCOUNTER — Ambulatory Visit: Payer: 59 | Admitting: Psychology

## 2022-12-06 ENCOUNTER — Other Ambulatory Visit: Payer: 59

## 2022-12-06 ENCOUNTER — Encounter: Payer: Self-pay | Admitting: Family

## 2022-12-06 ENCOUNTER — Other Ambulatory Visit: Payer: Self-pay

## 2022-12-06 ENCOUNTER — Inpatient Hospital Stay (HOSPITAL_BASED_OUTPATIENT_CLINIC_OR_DEPARTMENT_OTHER): Payer: 59 | Admitting: Family

## 2022-12-06 DIAGNOSIS — D509 Iron deficiency anemia, unspecified: Secondary | ICD-10-CM | POA: Diagnosis not present

## 2022-12-06 DIAGNOSIS — D0512 Intraductal carcinoma in situ of left breast: Secondary | ICD-10-CM

## 2022-12-06 NOTE — Progress Notes (Signed)
Hematology and Oncology Follow Up Visit  Pamela Ray [id number] July 05, 1966 57 y.o. 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 She is having issues with palpitations, feeling like her heart is squeezing in her chest and tachycardia. So far her work up with cardiology has been negative.  She also has significant hot flashes and night sweats.  She has made an appointment in May with a gynecologist that specializes in menopausal women.  Hopefully they will be able to help her.  No c/o pain.  She is doing well at work and home. No c/o fatigue at this time.  No issue with blood loss. No bruising or petechiae.  Appetite and hydration are good. Weight is stable.   ECOG Performance Status: 1 - Symptomatic but completely ambulatory  Medications:  Allergies as of 12/06/2022       Reactions   Amoxicillin Itching   Cephalexin Itching   Penicillins Hives   Venofer [iron Sucrose] Palpitations   Patient medicated with acetaminophen and diphenhydramine. Able to restart infusion and patient tolerated. See progress noted from 12/14/2021.        Medication List        Accurate as of December 06, 2022  3:02 PM. If you have any questions, ask your nurse or doctor.          Boric Acid Gran Insert 1 capsule in vagina every night x 14 days then every Monday and Thursday night indefinitely   Botox 100 units Solr injection Generic drug: botulinum toxin Type A Inject 100 Units into the muscle once.   Botulinum Toxin Type A (Cosm) 100 units Solr Every 3 months   chlorproMAZINE 25 MG tablet Commonly known as: THORAZINE   cloNIDine 0.1 MG tablet Commonly known as: CATAPRES Take 0.1 mg by mouth  at bedtime.   estradiol 0.1 MG/GM vaginal cream Commonly known as: ESTRACE Place 1 g vaginally 2 (two) times a week.   hydrocortisone 2.5 % ointment   ibuprofen 200 MG tablet Commonly known as: ADVIL Take 200 mg by mouth every 6 (six) hours as needed.   metoprolol tartrate 50 MG tablet Commonly known as: Lopressor Take one (1) tablet by mouth ( 50 mg) 2 hours prior to CT scan.   rosuvastatin 10 MG tablet Commonly known as: Crestor Take 1 tablet (10 mg total) by mouth daily.   SUMAtriptan 100 MG tablet Commonly known as: IMITREX Take by mouth.   Vitamin D (Ergocalciferol) 1.25 MG (50000 UNIT) Caps capsule Commonly known as: DRISDOL TAKE 1 CAPSULE BY MOUTH ONCE  WEEKLY        Allergies:  Allergies  Allergen Reactions   Amoxicillin Itching   Cephalexin Itching   Penicillins Hives   Venofer [Iron Sucrose] Palpitations    Patient medicated with acetaminophen and diphenhydramine. Able to restart infusion and patient tolerated. See progress noted from 12/14/2021.    Past Medical History, Surgical history, Social history, and Family History were reviewed and updated.  Review of Systems: All other 10 point review of systems is negative.   Physical Exam:  vitals were not taken for this visit.   Wt Readings from Last 3 Encounters:  10/23/22 156 lb 12.8 oz (71.1 kg)  10/02/22 155 lb 4 oz (70.4 kg)  06/07/22 149 lb (67.6 kg)    Ocular: Sclerae unicteric, pupils equal, round and reactive to light Ear-nose-throat: Oropharynx clear, dentition fair Lymphatic: No cervical or supraclavicular adenopathy Lungs no rales or rhonchi, good excursion bilaterally Heart regular rate and rhythm, no murmur appreciated Abd soft, nontender, positive bowel sounds MSK no focal spinal tenderness, no joint edema Neuro: non-focal, well-oriented, appropriate affect Breasts: Deferred   Lab Results  Component Value Date   WBC 5.3 11/29/2022   HGB 13.1 11/29/2022   HCT 40.2 11/29/2022    MCV 85.7 11/29/2022   PLT 220 11/29/2022   Lab Results  Component Value Date   FERRITIN 11 11/29/2022   IRON 78 11/29/2022   TIBC 497 (H) 11/29/2022   UIBC 419 11/29/2022   IRONPCTSAT 16 11/29/2022   Lab Results  Component Value Date   RETICCTPCT 1.0 05/12/2015   RBC 4.69 11/29/2022   RETICCTABS 48.9 05/12/2015   No results found for: "KPAFRELGTCHN", "LAMBDASER", "KAPLAMBRATIO" No results found for: "IGGSERUM", "IGA", "IGMSERUM" No results found for: "TOTALPROTELP", "ALBUMINELP", "A1GS", "A2GS", "BETS", "BETA2SER", "GAMS", "MSPIKE", "SPEI"   Chemistry      Component Value Date/Time   NA 142 11/29/2022 0922   NA 143 10/23/2022 1612   NA 141 07/25/2017 1131   NA 140 12/04/2016 0747   K 4.2 11/29/2022 0922   K 3.9 07/25/2017 1131   K 4.2 12/04/2016 0747   CL 104 11/29/2022 0922   CL 108 07/25/2017 1131   CO2 29 11/29/2022 0922   CO2 27 07/25/2017 1131   CO2 25 12/04/2016 0747   BUN 13 11/29/2022 0922   BUN 12 10/23/2022 1612   BUN 12 07/25/2017 1131   BUN 13.2 12/04/2016 0747   CREATININE 0.65 11/29/2022 0922   CREATININE 0.7 07/25/2017 1131   CREATININE 0.7 12/04/2016 0747      Component Value Date/Time   CALCIUM 9.8 11/29/2022 0922   CALCIUM 9.4 07/25/2017 1131   CALCIUM 9.3 12/04/2016 0747   ALKPHOS 84 11/29/2022 0922   ALKPHOS 65 07/25/2017 1131   ALKPHOS 64 12/04/2016 0747   AST 26 11/29/2022 0922   AST 17 12/04/2016 0747   ALT 22 11/29/2022 0922   ALT 20 07/25/2017 1131   ALT 10 12/04/2016 0747   BILITOT 0.7 11/29/2022 0922   BILITOT 0.73 12/04/2016 0747       Impression and Plan:  Pamela Ray is a very pleasant 57 yo caucasian female with 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 She had a lumpectomy followed by radiation and is now on Fareston. She will complete a full 10 years of Fareston (2024).  Iron studies stable at this time.  Lab only in 2 months.  Follow-up in 6 months.    [name], NP 4/19/20243:02 PM

## 2022-12-19 ENCOUNTER — Ambulatory Visit: Payer: 59 | Admitting: Psychology

## 2023-01-02 ENCOUNTER — Ambulatory Visit: Payer: 59 | Admitting: Psychology

## 2023-01-16 ENCOUNTER — Ambulatory Visit: Payer: 59 | Admitting: Psychology

## 2023-01-30 ENCOUNTER — Ambulatory Visit: Payer: 59 | Admitting: Psychology

## 2023-02-05 ENCOUNTER — Ambulatory Visit: Payer: 59 | Attending: Family Medicine

## 2023-02-05 ENCOUNTER — Telehealth: Payer: Self-pay | Admitting: Internal Medicine

## 2023-02-05 ENCOUNTER — Ambulatory Visit (INDEPENDENT_AMBULATORY_CARE_PROVIDER_SITE_OTHER): Payer: 59 | Admitting: Family Medicine

## 2023-02-05 ENCOUNTER — Encounter: Payer: Self-pay | Admitting: Family Medicine

## 2023-02-05 VITALS — BP 102/70 | HR 80 | Temp 98.6°F | Resp 17 | Ht 66.0 in | Wt 150.2 lb

## 2023-02-05 DIAGNOSIS — R002 Palpitations: Secondary | ICD-10-CM

## 2023-02-05 LAB — BASIC METABOLIC PANEL
BUN: 16 mg/dL (ref 6–23)
CO2: 30 mEq/L (ref 19–32)
Calcium: 9.4 mg/dL (ref 8.4–10.5)
Chloride: 103 mEq/L (ref 96–112)
Creatinine, Ser: 0.58 mg/dL (ref 0.40–1.20)
GFR: 102.33 mL/min (ref >60.00)
Glucose, Bld: 97 mg/dL (ref 70–99)
Potassium: 4.4 mEq/L (ref 3.5–5.1)
Sodium: 139 mEq/L (ref 135–145)

## 2023-02-05 LAB — CBC WITH DIFFERENTIAL/PLATELET
Basophils Absolute: 0 10*3/uL (ref 0.0–0.1)
Basophils Relative: 0.6 % (ref 0.0–3.0)
Eosinophils Absolute: 0.5 10*3/uL (ref 0.0–0.7)
Eosinophils Relative: 9 % — ABNORMAL HIGH (ref 0.0–5.0)
HCT: 39 % (ref 36.0–46.0)
Hemoglobin: 12.4 g/dL (ref 12.0–15.0)
Lymphocytes Relative: 25.6 % (ref 12.0–46.0)
Lymphs Abs: 1.3 10*3/uL (ref 0.7–4.0)
MCHC: 31.9 g/dL (ref 30.0–36.0)
MCV: 78.1 fl (ref 78.0–100.0)
Monocytes Absolute: 0.5 10*3/uL (ref 0.1–1.0)
Monocytes Relative: 8.9 % (ref 3.0–12.0)
Neutro Abs: 3 10*3/uL (ref 1.4–7.7)
Neutrophils Relative %: 55.9 % (ref 43.0–77.0)
Platelets: 229 10*3/uL (ref 150.0–400.0)
RBC: 5 Mil/uL (ref 3.87–5.11)
RDW: 14.8 % (ref 11.5–15.5)
WBC: 5.3 10*3/uL (ref 4.0–10.5)

## 2023-02-05 LAB — HEPATIC FUNCTION PANEL
ALT: 12 U/L (ref 0–35)
AST: 20 U/L (ref 0–37)
Albumin: 4.2 g/dL (ref 3.5–5.2)
Alkaline Phosphatase: 75 U/L (ref 39–117)
Bilirubin, Direct: 0.1 mg/dL (ref 0.0–0.3)
Total Bilirubin: 0.6 mg/dL (ref 0.2–1.2)
Total Protein: 7 g/dL (ref 6.0–8.3)

## 2023-02-05 LAB — TSH: TSH: 2.03 u[IU]/mL (ref 0.35–5.50)

## 2023-02-05 NOTE — Progress Notes (Unsigned)
Enrolled for [employer] to mail a ZIO XT long term holter monitor to the patients address on file.   Dr. Acharya to read. 

## 2023-02-05 NOTE — Progress Notes (Signed)
   Subjective:    Patient ID: Pamela Ray, female    DOB: June 05, 1966, 57 y.o.   MRN: [id number]  HPI Chest tightness- pt has had Cardiology work up and this is unrevealing.  Pt reports heart will 'race' and beat 'harder'.  Sxs are disruptive.  'occur more times than I can count during the day'.  No particular triggers.  Not followed by hot flashes like it was previously.  Has not worn a monitor.  Pt reports HR can jump as high as 136 at rest.  Watch records change in HR.  Pt denies anxiety- 'everything in my life is really good'.  No hx of COVID infxn.   Review of Systems For ROS see HPI     Objective:   Physical Exam Vitals reviewed.  Constitutional:      General: She is not in acute distress.    Appearance: She is well-developed.  HENT:     Head: Normocephalic and atraumatic.  Eyes:     Conjunctiva/sclera: Conjunctivae normal.     Pupils: Pupils are equal, round, and reactive to light.  Neck:     Thyroid: No thyromegaly.  Cardiovascular:     Rate and Rhythm: Normal rate and regular rhythm.     Pulses: Normal pulses.     Heart sounds: Normal heart sounds. No murmur heard. Pulmonary:     Effort: Pulmonary effort is normal. No respiratory distress.     Breath sounds: Normal breath sounds.  Abdominal:     General: There is no distension.     Palpations: Abdomen is soft.     Tenderness: There is no abdominal tenderness.  Musculoskeletal:     Cervical back: Normal range of motion and neck supple.     Right lower leg: No edema.     Left lower leg: No edema.  Lymphadenopathy:     Cervical: No cervical adenopathy.  Skin:    General: Skin is warm and dry.  Neurological:     General: No focal deficit present.     Mental Status: She is alert and oriented to person, place, and time.  Psychiatric:        Mood and Affect: Mood normal.        Behavior: Behavior normal.        Thought Content: Thought content normal.           Assessment & Plan:  Palpitations- ongoing.  Pt  reports her chest tightness and palpitations are occurring multiple times each day.  She reports these occur independently of exertion, caffeine intake, anxiety, or other triggers.  She had a normal calcium score but has not worn a monitor to assess her underlying rhythm.  Sxs could possibly be SVT or paroxysmal Afib.  Will check labs for possible underlying electrolyte abnormality or thyroid issue.  Will also get pt a monitor and then determine the next steps.  Pt expressed understanding and is in agreement w/ plan.

## 2023-02-05 NOTE — Telephone Encounter (Signed)
Health Team Red Word - Chest Tightness  Pt was scheduled for appt this morning was called and states she's not feeling well. Had already seen her cardiologist (they discussed it may pre-menopause) and they told her to return to her PCP. She states it's been consistent for a few months. She doesn't feel the need for ED nor Triage. But I sent her to Triage.

## 2023-02-05 NOTE — Patient Instructions (Signed)
Follow up as needed or as scheduled We'll notify you of your lab results and make any changes if needed I ordered the heart monitor- Cardiology should reach out about how to proceed Try and limit caffeine as this can worsen palpitations Call with any questions or concerns Hang in there!!!

## 2023-02-05 NOTE — Telephone Encounter (Signed)
Pt states she does not see the need to ED . Cardiologist seems to think is it menopause and sent her to [place]

## 2023-02-06 ENCOUNTER — Telehealth: Payer: Self-pay

## 2023-02-06 LAB — IRON,TIBC AND FERRITIN PANEL
%SAT: 10 % (calc) — ABNORMAL LOW (ref 16–45)
Ferritin: 7 ng/mL — ABNORMAL LOW (ref 16–232)
Iron: 46 ug/dL (ref 45–160)
TIBC: 445 mcg/dL (calc) (ref 250–450)

## 2023-02-06 NOTE — Telephone Encounter (Signed)
Left results on pt VM  

## 2023-02-06 NOTE — Telephone Encounter (Signed)
-----   Message from Sheliah Hatch, MD sent at 02/05/2023  5:20 PM EDT ----- Labs all look good!  Waiting on the iron panel w/ ferritin

## 2023-02-06 NOTE — Telephone Encounter (Signed)
-----   Message from Sheliah Hatch, MD sent at 02/06/2023  7:18 AM EDT ----- Your total iron level is just above the threshold of normal and your ferritin and % sat are both low.  Will send these results to Hematology to review as we drew them on their behalf.

## 2023-02-07 ENCOUNTER — Encounter: Payer: Self-pay | Admitting: Family

## 2023-02-12 ENCOUNTER — Inpatient Hospital Stay: Payer: 59 | Attending: Hematology & Oncology

## 2023-02-12 VITALS — BP 115/75 | HR 57 | Temp 97.7°F | Resp 16 | Ht 66.0 in | Wt 151.0 lb

## 2023-02-12 DIAGNOSIS — D0512 Intraductal carcinoma in situ of left breast: Secondary | ICD-10-CM | POA: Insufficient documentation

## 2023-02-12 DIAGNOSIS — D509 Iron deficiency anemia, unspecified: Secondary | ICD-10-CM | POA: Diagnosis present

## 2023-02-12 DIAGNOSIS — D508 Other iron deficiency anemias: Secondary | ICD-10-CM

## 2023-02-12 DIAGNOSIS — R002 Palpitations: Secondary | ICD-10-CM

## 2023-02-12 DIAGNOSIS — Z79810 Long term (current) use of selective estrogen receptor modulators (SERMs): Secondary | ICD-10-CM | POA: Diagnosis not present

## 2023-02-12 MED ORDER — SODIUM CHLORIDE 0.9 % IV SOLN: INTRAVENOUS | Status: DC

## 2023-02-12 MED ORDER — SODIUM CHLORIDE 0.9 % IV SOLN
200.0000 mg | Freq: Once | INTRAVENOUS | Status: AC
  Administered 2023-02-12: 200 mg via INTRAVENOUS
  Filled 2023-02-12: qty 200

## 2023-02-12 MED ORDER — ACETAMINOPHEN 325 MG PO TABS
650.0000 mg | ORAL_TABLET | Freq: Once | ORAL | Status: AC
  Administered 2023-02-12: 650 mg via ORAL
  Filled 2023-02-12: qty 2

## 2023-02-12 MED ORDER — DIPHENHYDRAMINE HCL 25 MG PO CAPS
50.0000 mg | ORAL_CAPSULE | Freq: Once | ORAL | Status: AC
  Administered 2023-02-12: 25 mg via ORAL
  Filled 2023-02-12: qty 2

## 2023-02-12 NOTE — Patient Instructions (Signed)

## 2023-02-13 ENCOUNTER — Ambulatory Visit (INDEPENDENT_AMBULATORY_CARE_PROVIDER_SITE_OTHER): Payer: 59 | Admitting: Psychology

## 2023-02-13 DIAGNOSIS — F411 Generalized anxiety disorder: Secondary | ICD-10-CM | POA: Diagnosis not present

## 2023-02-13 NOTE — Progress Notes (Signed)
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 (Status: maintained) -- No Description Entered  Excessive and/or unrealistic worry that is difficult to control occurring more days than not for at least 6 months about a number of events or activities. (Status: maintained) -- No Description Entered  Feelings of hopelessness, worthlessness, or inappropriate guilt. (Status: maintained) -- No Description Entered  Medication Status compliance  Safety none  If Suicidal or Homicidal State Action Taken: unspecified  Current Risk: low Medications unspecified Objectives Related Problem: Develop healthy thinking patterns and beliefs about self, others, and the world that lead to the alleviation and help prevent the relapse of depression. Description: Learn and implement conflict resolution skills to resolve interpersonal problems. Target Date: 2023-08-05 Frequency: Daily Modality: individual Progress: 90%  Related Problem: Develop healthy thinking patterns and beliefs about self, others, and the world that lead to the alleviation and help prevent the relapse of depression. Description: Learn and implement problem-solving and decision-making skills. Target Date: 2023-08-05 Frequency: Daily Modality: individual Progress: 95%  Related Problem: Develop healthy thinking patterns and beliefs about self, others, and the world that lead to the alleviation and help prevent the relapse of depression. Description: Learn and implement behavioral strategies to overcome depression. Target Date: 2023-02-03 Frequency: Daily Modality: individual Progress:100%  Related Problem: Develop healthy thinking patterns and beliefs about self, others, and the world that lead to the alleviation and help prevent the relapse of  depression. Description: Identify and replace thoughts and beliefs that support depression. Target Date: 2023-08-05 Frequency: Daily Modality: individual Progress: 100%  Related Problem: Develop healthy thinking patterns and beliefs about self, others, and the world that lead to the alleviation and help prevent the relapse of depression. Description: Describe current and past experiences with depression including their impact on functioning and attempts to resolve it. Target Date: 2023-02-03 Frequency: Daily Modality: individual Progress: 100%  Related Problem: Reduce overall frequency, intensity, and duration of the anxiety so that daily functioning is not impaired. Description: Maintain involvement in work, family, and social activities. Target Date: 2023-02-03 Frequency: Daily Modality: individual Progress: 100%  Related Problem: Reduce overall frequency, intensity, and duration of the anxiety so that daily functioning is not impaired. Description: Learn to accept limitations in life and commit to tolerating, rather than avoiding, unpleasant emotions while accomplishing meaningful goals. Target Date: 2023-08-05 Frequency: Daily Modality: individual Progress: 85%  Related Problem: Reduce overall frequency, intensity, and duration of the anxiety so that daily functioning is not impaired. Description: Learn and implement problem-solving strategies for realistically addressing worries. Target Date: 2023-08-05 Frequency: Daily Modality: individual Progress: 80%  Related Problem: Reduce overall frequency, intensity, and duration of the anxiety so that daily functioning is not impaired. Description: Identify, challenge, and replace biased, fearful self-talk with positive, realistic, and empowering self-talk. Target Date: 2023-02-03 Frequency: Daily Modality: individual Progress: 100%  Related Problem: Reduce overall frequency, intensity, and duration of the anxiety so that daily  functioning is not impaired. Description: Describe situations, thoughts, feelings, and actions associated with anxieties and worries, their impact on functioning, and attempts to resolve them. Target Date: 2023-08-05 Frequency: Daily Modality: individual Progress: 90%  Client Response full compliance  Service Location Location, [address] [place], [place] [address]  Service Code cpt [phone]  Lifestyle change (exercise, nutrition)  Self-monitoring  Normalize/Reframe  Facilitate problem solving  Identify/label emotions  Validate/empathize  Emotion regulation skills  Self care activities  Related past to present  Session Notes F33.2, F41.1.  Goals: Resolve grief related to the loss of her mother, foster independence, resolve guilt, manage unresolved family of origin issues, resolve feelings associated with failed marriage, address issues related to establishing new romantic relationship. Also, has some conflicts with her sons that are not resolved. Goal Date is 12-24.  Patient agrees to a phone session and is aware of the limitations. She is at home and I am in my home office.   Olegario Messier says life is mostly good, but she is still having issues with Autumn. There was a big family gathering (party) and Autumn was insistent on helping prepare. She feels Autumn "dropped the ball" and went out to socialize. Olegario Messier was left to do the work Autumn said she would do. Ultimately, she talked to Autumn and she helped. Olegario Messier was uncomfortable that Autumn was wearing a dress, especially around this group of people. She felt Autumn was acting like a Diva.  Olegario Messier is going through menopause and is having physical symptoms. She shared some of the medical problems she is having  with Autumn, who told Olegario Messier she can relate because of estrogen levels. Olegario Messier is feeling Autumn is delusional. Olegario Messier also has concerns that Autumn is dating and she is not sure if she is telling potential dates she is trans. Olegario Messier has  worked to be accepting of her son's decision, but feels ridiculed for asking tough questions. We talked about ways to introduce some of her fears/concerns with Autumn. Olegario Messier also talked about her frustration with the medical community in that she cannot get information about hormone replacement. Very frustrated. Suggested that she consider changing some of her providers.                                             [name], PhD Time 5:10-6:00p 50 min.

## 2023-02-19 ENCOUNTER — Inpatient Hospital Stay: Payer: 59 | Attending: Hematology & Oncology

## 2023-02-19 VITALS — BP 109/67 | HR 69 | Temp 97.9°F | Resp 17

## 2023-02-19 DIAGNOSIS — D508 Other iron deficiency anemias: Secondary | ICD-10-CM

## 2023-02-19 DIAGNOSIS — D509 Iron deficiency anemia, unspecified: Secondary | ICD-10-CM | POA: Diagnosis present

## 2023-02-19 MED ORDER — ACETAMINOPHEN 325 MG PO TABS: 650.0000 mg | ORAL_TABLET | Freq: Once | ORAL | Status: DC

## 2023-02-19 MED ORDER — DIPHENHYDRAMINE HCL 25 MG PO CAPS: 50.0000 mg | ORAL_CAPSULE | Freq: Once | ORAL | Status: DC

## 2023-02-19 MED ORDER — SODIUM CHLORIDE 0.9 % IV SOLN: INTRAVENOUS | Status: DC

## 2023-02-19 MED ORDER — SODIUM CHLORIDE 0.9 % IV SOLN
200.0000 mg | Freq: Once | INTRAVENOUS | Status: AC
  Administered 2023-02-19: 200 mg via INTRAVENOUS
  Filled 2023-02-19: qty 200

## 2023-02-19 NOTE — Patient Instructions (Signed)

## 2023-02-26 ENCOUNTER — Inpatient Hospital Stay: Payer: 59

## 2023-02-26 VITALS — BP 107/66 | HR 63 | Temp 98.0°F | Resp 16

## 2023-02-26 DIAGNOSIS — D509 Iron deficiency anemia, unspecified: Secondary | ICD-10-CM | POA: Diagnosis not present

## 2023-02-26 DIAGNOSIS — D508 Other iron deficiency anemias: Secondary | ICD-10-CM

## 2023-02-26 MED ORDER — ACETAMINOPHEN 325 MG PO TABS: 650.0000 mg | ORAL_TABLET | Freq: Once | ORAL | Status: DC

## 2023-02-26 MED ORDER — SODIUM CHLORIDE 0.9 % IV SOLN: INTRAVENOUS | Status: DC

## 2023-02-26 MED ORDER — DIPHENHYDRAMINE HCL 25 MG PO CAPS: 50.0000 mg | ORAL_CAPSULE | Freq: Once | ORAL | Status: DC

## 2023-02-26 MED ORDER — SODIUM CHLORIDE 0.9 % IV SOLN
200.0000 mg | Freq: Once | INTRAVENOUS | Status: AC
  Administered 2023-02-26: 200 mg via INTRAVENOUS
  Filled 2023-02-26: qty 200

## 2023-02-26 NOTE — Patient Instructions (Signed)

## 2023-02-27 ENCOUNTER — Ambulatory Visit: Payer: 59 | Admitting: Psychology

## 2023-03-03 ENCOUNTER — Telehealth: Payer: Self-pay

## 2023-03-03 NOTE — Telephone Encounter (Signed)
Pt seen results Via my chart  

## 2023-03-03 NOTE — Telephone Encounter (Signed)
-----   Message from Neena Rhymes sent at 03/03/2023  7:41 AM EDT ----- Your monitor shows that you had 2 runs of SVT which seemed to correspond to the symptoms you were having.  I am routing this to Dr Anne Fu (the cardiologist you saw) to determine next steps.  I would think we could try a low dose beta blocker to control your heart rate but we have to be careful given your low blood pressure.  Will wait and see what recommendations he has

## 2023-03-04 ENCOUNTER — Telehealth: Payer: Self-pay

## 2023-03-04 MED ORDER — METOPROLOL TARTRATE 25 MG PO TABS: 12.5000 mg | ORAL_TABLET | Freq: Every day | ORAL | 1 refills | Status: DC | PRN

## 2023-03-04 NOTE — Telephone Encounter (Signed)
-----   Message from [facility] sent at 03/03/2023  1:51 PM EDT ----- I think it makes sense to try low dose metoprolol tartrate 25mg  PRN palpitations. Could even break tablet in half with low normal BP.  Can take as needed if she is having a bad day of skips. Also can try magnesium supplement as well.   Thanks for the update -Mark ----- Message ----- From: Sheliah Hatch, MD Sent: 03/03/2023   7:41 AM EDT To: Jake Bathe, MD; [facility]  Your monitor shows that you had 2 runs of SVT which seemed to correspond to the symptoms you were having.  I am routing this to Dr Anne Fu (the cardiologist you saw) to determine next steps.  I would think we could try a low dose beta blocker to control your heart rate but we have to be careful given your low blood pressure.  Will wait and see what recommendations he has

## 2023-03-04 NOTE — Telephone Encounter (Addendum)
I spoke with the pt re: her monitor and Dr Anne Fu' recommendations initiated by Dr Beverely Low and she verbalized understanding and will try 1/2 of the metoprolol 25 mg for now and will let us know or Dr Beverely Low how she is doing and if she needs to increase to a full tablet. She will monitor her BP at home.   Pt is already taking mag supp.... will add to her med list/ OTC Life Extension Neuro-Mag L Threonate 144 mg at bedtime.   Per our PharmD, pt can take an added 400 mg of MagOxide if needed for continued palps but to alert her PCP of the change so we can add to her med list.

## 2023-03-05 ENCOUNTER — Inpatient Hospital Stay: Payer: 59

## 2023-03-05 VITALS — BP 99/67 | HR 67 | Temp 98.5°F | Resp 17

## 2023-03-05 DIAGNOSIS — D508 Other iron deficiency anemias: Secondary | ICD-10-CM

## 2023-03-05 DIAGNOSIS — D509 Iron deficiency anemia, unspecified: Secondary | ICD-10-CM | POA: Diagnosis not present

## 2023-03-05 MED ORDER — SODIUM CHLORIDE 0.9 % IV SOLN
200.0000 mg | Freq: Once | INTRAVENOUS | Status: AC
  Administered 2023-03-05: 200 mg via INTRAVENOUS
  Filled 2023-03-05: qty 200

## 2023-03-05 MED ORDER — ACETAMINOPHEN 325 MG PO TABS: 650.0000 mg | ORAL_TABLET | Freq: Once | ORAL | Status: DC

## 2023-03-05 MED ORDER — DIPHENHYDRAMINE HCL 25 MG PO CAPS: 50.0000 mg | ORAL_CAPSULE | Freq: Once | ORAL | Status: DC

## 2023-03-05 MED ORDER — SODIUM CHLORIDE 0.9 % IV SOLN: Freq: Once | INTRAVENOUS | Status: AC

## 2023-03-05 NOTE — Patient Instructions (Signed)
Iron Sucrose Injection What is this medication? IRON SUCROSE (EYE ern SOO krose) treats low levels of iron (iron deficiency anemia) in people with kidney disease. Iron is a mineral that plays an important role in making red blood cells, which carry oxygen from your lungs to the rest of your body. This medicine may be used for other purposes; ask your health care provider or pharmacist if you have questions. COMMON BRAND NAME(S): Venofer What should I tell my care team before I take this medication? They need to know if you have any of these conditions: Anemia not caused by low iron levels Heart disease High levels of iron in the blood Kidney disease Liver disease An unusual or allergic reaction to iron, other medications, foods, dyes, or preservatives Pregnant or trying to get pregnant Breastfeeding How should I use this medication? This medication is for infusion into a vein. It is given in a hospital or clinic setting. Talk to your care team about the use of this medication in children. While this medication may be prescribed for children as young as 2 years for selected conditions, precautions do apply. Overdosage: If you think you have taken too much of this medicine contact a poison control center or emergency room at once. NOTE: This medicine is only for you. Do not share this medicine with others. What if I miss a dose? Keep appointments for follow-up doses. It is important not to miss your dose. Call your care team if you are unable to keep an appointment. What may interact with this medication? Do not take this medication with any of the following: Deferoxamine Dimercaprol Other iron products This medication may also interact with the following: Chloramphenicol Deferasirox This list may not describe all possible interactions. Give your health care provider a list of all the medicines, herbs, non-prescription drugs, or dietary supplements you use. Also tell them if you smoke,  drink alcohol, or use illegal drugs. Some items may interact with your medicine. What should I watch for while using this medication? Visit your care team regularly. Tell your care team if your symptoms do not start to get better or if they get worse. You may need blood work done while you are taking this medication. You may need to follow a special diet. Talk to your care team. Foods that contain iron include: whole grains/cereals, dried fruits, beans, or peas, leafy green vegetables, and organ meats (liver, kidney). What side effects may I notice from receiving this medication? Side effects that you should report to your care team as soon as possible: Allergic reactions--skin rash, itching, hives, swelling of the face, lips, tongue, or throat Low blood pressure--dizziness, feeling faint or lightheaded, blurry vision Shortness of breath Side effects that usually do not require medical attention (report to your care team if they continue or are bothersome): Flushing Headache Joint pain Muscle pain Nausea Pain, redness, or irritation at injection site This list may not describe all possible side effects. Call your doctor for medical advice about side effects. You may report side effects to FDA at 1-800-FDA-1088. Where should I keep my medication? This medication is given in a hospital or clinic and will not be stored at home. NOTE: This sheet is a summary. It may not cover all possible information. If you have questions about this medicine, talk to your doctor, pharmacist, or health care provider.  2024 Elsevier/Gold Standard (2022-02-13 00:00:00)  

## 2023-03-06 ENCOUNTER — Inpatient Hospital Stay: Payer: 59

## 2023-03-12 ENCOUNTER — Inpatient Hospital Stay: Payer: 59

## 2023-03-12 VITALS — BP 114/82 | HR 62 | Temp 97.8°F | Resp 18

## 2023-03-12 DIAGNOSIS — D509 Iron deficiency anemia, unspecified: Secondary | ICD-10-CM | POA: Diagnosis not present

## 2023-03-12 DIAGNOSIS — D508 Other iron deficiency anemias: Secondary | ICD-10-CM

## 2023-03-12 MED ORDER — SODIUM CHLORIDE 0.9 % IV SOLN: INTRAVENOUS | Status: DC

## 2023-03-12 MED ORDER — SODIUM CHLORIDE 0.9 % IV SOLN
200.0000 mg | Freq: Once | INTRAVENOUS | Status: AC
  Administered 2023-03-12: 200 mg via INTRAVENOUS
  Filled 2023-03-12: qty 200

## 2023-03-12 MED ORDER — ACETAMINOPHEN 325 MG PO TABS: 650.0000 mg | ORAL_TABLET | Freq: Once | ORAL | Status: DC

## 2023-03-12 MED ORDER — DIPHENHYDRAMINE HCL 25 MG PO CAPS: 50.0000 mg | ORAL_CAPSULE | Freq: Once | ORAL | Status: DC

## 2023-03-12 NOTE — Patient Instructions (Signed)
Iron Sucrose Injection What is this medication? IRON SUCROSE (EYE ern SOO krose) treats low levels of iron (iron deficiency anemia) in people with kidney disease. Iron is a mineral that plays an important role in making red blood cells, which carry oxygen from your lungs to the rest of your body. This medicine may be used for other purposes; ask your health care provider or pharmacist if you have questions. COMMON BRAND NAME(S): Venofer What should I tell my care team before I take this medication? They need to know if you have any of these conditions: Anemia not caused by low iron levels Heart disease High levels of iron in the blood Kidney disease Liver disease An unusual or allergic reaction to iron, other medications, foods, dyes, or preservatives Pregnant or trying to get pregnant Breastfeeding How should I use this medication? This medication is for infusion into a vein. It is given in a hospital or clinic setting. Talk to your care team about the use of this medication in children. While this medication may be prescribed for children as young as 2 years for selected conditions, precautions do apply. Overdosage: If you think you have taken too much of this medicine contact a poison control center or emergency room at once. NOTE: This medicine is only for you. Do not share this medicine with others. What if I miss a dose? Keep appointments for follow-up doses. It is important not to miss your dose. Call your care team if you are unable to keep an appointment. What may interact with this medication? Do not take this medication with any of the following: Deferoxamine Dimercaprol Other iron products This medication may also interact with the following: Chloramphenicol Deferasirox This list may not describe all possible interactions. Give your health care provider a list of all the medicines, herbs, non-prescription drugs, or dietary supplements you use. Also tell them if you smoke,  drink alcohol, or use illegal drugs. Some items may interact with your medicine. What should I watch for while using this medication? Visit your care team regularly. Tell your care team if your symptoms do not start to get better or if they get worse. You may need blood work done while you are taking this medication. You may need to follow a special diet. Talk to your care team. Foods that contain iron include: whole grains/cereals, dried fruits, beans, or peas, leafy green vegetables, and organ meats (liver, kidney). What side effects may I notice from receiving this medication? Side effects that you should report to your care team as soon as possible: Allergic reactions--skin rash, itching, hives, swelling of the face, lips, tongue, or throat Low blood pressure--dizziness, feeling faint or lightheaded, blurry vision Shortness of breath Side effects that usually do not require medical attention (report to your care team if they continue or are bothersome): Flushing Headache Joint pain Muscle pain Nausea Pain, redness, or irritation at injection site This list may not describe all possible side effects. Call your doctor for medical advice about side effects. You may report side effects to FDA at 1-800-FDA-1088. Where should I keep my medication? This medication is given in a hospital or clinic. It will not be stored at home. NOTE: This sheet is a summary. It may not cover all possible information. If you have questions about this medicine, talk to your doctor, pharmacist, or health care provider.  2024 Elsevier/Gold Standard (2023-01-10 00:00:00)

## 2023-03-13 ENCOUNTER — Ambulatory Visit: Payer: 59 | Admitting: Psychology

## 2023-03-13 ENCOUNTER — Inpatient Hospital Stay: Payer: 59

## 2023-04-10 ENCOUNTER — Ambulatory Visit (INDEPENDENT_AMBULATORY_CARE_PROVIDER_SITE_OTHER): Payer: 59 | Admitting: Psychology

## 2023-04-10 DIAGNOSIS — F411 Generalized anxiety disorder: Secondary | ICD-10-CM

## 2023-04-10 NOTE — Progress Notes (Signed)
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 (Status: maintained) -- No Description Entered  Excessive and/or unrealistic worry that is difficult to control occurring more days than not for at least 6 months about a number of events or activities. (Status: maintained) -- No Description Entered  Feelings of hopelessness, worthlessness, or inappropriate guilt. (Status: maintained) -- No Description Entered  Medication Status compliance  Safety none  If Suicidal or Homicidal State Action Taken: unspecified  Current Risk: low Medications unspecified Objectives Related Problem: Develop healthy thinking patterns and beliefs about self, others, and the world that lead to the alleviation and help prevent the relapse of depression. Description: Learn and implement conflict resolution skills to resolve interpersonal problems. Target Date: 2023-08-05 Frequency: Daily Modality: individual Progress: 90%  Related Problem: Develop healthy thinking patterns and beliefs about self, others, and the world that lead to the alleviation and help prevent the relapse of depression. Description: Learn and implement problem-solving and decision-making skills. Target Date: 2023-08-05 Frequency: Daily Modality: individual Progress: 95%  Related Problem: Develop healthy thinking patterns and beliefs about self, others, and the world that lead to the alleviation and help prevent the relapse of depression. Description: Learn and implement behavioral strategies to overcome depression. Target Date: 2023-02-03 Frequency: Daily Modality: individual Progress:100%  Related Problem: Develop healthy thinking patterns and beliefs about self, others, and the world that lead to the alleviation and help prevent  the relapse of depression. Description: Identify and replace thoughts and beliefs that support depression. Target Date: 2023-08-05 Frequency: Daily Modality: individual Progress: 100%  Related Problem: Develop healthy thinking patterns and beliefs about self, others, and the world that lead to the alleviation and help prevent the relapse of depression. Description: Describe current and past experiences with depression including their impact on functioning and attempts to resolve it. Target Date: 2023-02-03 Frequency: Daily Modality: individual Progress: 100%  Related Problem: Reduce overall frequency, intensity, and duration of the anxiety so that daily functioning is not impaired. Description: Maintain involvement in work, family, and social activities. Target Date: 2023-02-03 Frequency: Daily Modality: individual Progress: 100%  Related Problem: Reduce overall frequency, intensity, and duration of the anxiety so that daily functioning is not impaired. Description: Learn to accept limitations in life and commit to tolerating, rather than avoiding, unpleasant emotions while accomplishing meaningful goals. Target Date: 2023-08-05 Frequency: Daily Modality: individual Progress: 85%  Related Problem: Reduce overall frequency, intensity, and duration of the anxiety so that daily functioning is not impaired. Description: Learn and implement problem-solving strategies for realistically addressing worries. Target Date: 2023-08-05 Frequency: Daily Modality: individual Progress: 80%  Related Problem: Reduce overall frequency, intensity, and duration of the anxiety so that daily functioning is not impaired. Description: Identify, challenge, and replace biased, fearful self-talk with positive, realistic, and empowering self-talk. Target Date: 2023-02-03 Frequency: Daily Modality: individual Progress: 100%  Related Problem: Reduce overall frequency, intensity, and duration of the anxiety so that  daily functioning is not impaired. Description: Describe situations, thoughts, feelings, and actions associated with anxieties and worries, their impact on functioning, and attempts to resolve them. Target Date: 2023-08-05 Frequency: Daily Modality: individual Progress: 90%  Client Response full compliance  Service Location Location, [address] [place], [place]  [address]  Service Code cpt [id number]  Lifestyle change (exercise, nutrition)  Self-monitoring  Normalize/Reframe  Facilitate problem solving  Identify/label emotions  Validate/empathize  Emotion regulation skills  Self care activities  Related past to present  Session Notes F33.2, F41.1.  Goals: Resolve grief related to the loss of her mother, foster independence, resolve guilt, manage unresolved family of origin issues, resolve feelings associated with failed marriage, address issues related to establishing new romantic relationship. Also, has some conflicts with her sons that are not resolved. Goal Date is 12-24.  Patient agrees to a phone session and is aware of the limitations. She is at home and I am in my home office.   Pamela Ray says that she had the conversation with Pamela Ray about autism. Pamela Ray says "they" are lonely and was willing to share a lot with Pamela Ray. She feels a lot of pressure to be there for Pamela Ray because they have very few friends/resources. She is trying to "figure out" their relationship, but they are on a good path. She states that she and Pamela Ray are doing well, but feels there is something a "little off". She thinks there is something he is not sharing with her. We talked about how to approach the relationship more like a partnership. She needs to feel better about asserting her opinion.                                                [name], PhD Time 5:10-6:00p 50 min.

## 2023-04-24 ENCOUNTER — Ambulatory Visit: Payer: 59 | Admitting: Psychology

## 2023-05-08 ENCOUNTER — Ambulatory Visit (INDEPENDENT_AMBULATORY_CARE_PROVIDER_SITE_OTHER): Payer: 59 | Admitting: Psychology

## 2023-05-08 DIAGNOSIS — F332 Major depressive disorder, recurrent severe without psychotic features: Secondary | ICD-10-CM | POA: Diagnosis not present

## 2023-05-08 DIAGNOSIS — F411 Generalized anxiety disorder: Secondary | ICD-10-CM | POA: Diagnosis not present

## 2023-05-08 NOTE — Progress Notes (Signed)
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 (Status: maintained) -- No Description Entered  Excessive and/or unrealistic worry that is difficult to control occurring more days than not for at least 6 months about a number of events or activities. (Status: maintained) -- No Description Entered  Feelings of hopelessness, worthlessness, or inappropriate guilt. (Status: maintained) -- No Description Entered  Medication Status compliance  Safety none  If Suicidal or Homicidal State Action Taken: unspecified  Current Risk: low Medications unspecified Objectives Related Problem: Develop healthy thinking patterns and beliefs about self, others, and the world that lead to the alleviation and help prevent the relapse of depression. Description: Learn and implement conflict resolution skills to resolve interpersonal problems. Target Date: 2023-08-05 Frequency: Daily Modality: individual Progress: 90%  Related Problem: Develop healthy thinking patterns and beliefs about self, others, and the world that lead to the alleviation and help prevent the relapse of depression. Description: Learn and implement problem-solving and decision-making skills. Target Date: 2023-08-05 Frequency: Daily Modality: individual Progress: 95%  Related Problem: Develop healthy thinking patterns and beliefs about self, others, and the world that lead to the alleviation and help prevent the relapse of depression. Description: Learn and implement behavioral strategies to overcome depression. Target Date: 2023-02-03 Frequency: Daily Modality: individual Progress:100%  Related Problem: Develop healthy thinking patterns and beliefs about self, others, and the world that lead to  the alleviation and help prevent the relapse of depression. Description: Identify and replace thoughts and beliefs that support depression. Target Date: 2023-08-05 Frequency: Daily Modality: individual Progress: 100%  Related Problem: Develop healthy thinking patterns and beliefs about self, others, and the world that lead to the alleviation and help prevent the relapse of depression. Description: Describe current and past experiences with depression including their impact on functioning and attempts to resolve it. Target Date: 2023-02-03 Frequency: Daily Modality: individual Progress: 100%  Related Problem: Reduce overall frequency, intensity, and duration of the anxiety so that daily functioning is not impaired. Description: Maintain involvement in work, family, and social activities. Target Date: 2023-02-03 Frequency: Daily Modality: individual Progress: 100%  Related Problem: Reduce overall frequency, intensity, and duration of the anxiety so that daily functioning is not impaired. Description: Learn to accept limitations in life and commit to tolerating, rather than avoiding, unpleasant emotions while accomplishing meaningful goals. Target Date: 2023-08-05 Frequency: Daily Modality: individual Progress: 85%  Related Problem: Reduce overall frequency, intensity, and duration of the anxiety so that daily functioning is not impaired. Description: Learn and implement problem-solving strategies for realistically addressing worries. Target Date: 2023-08-05 Frequency: Daily Modality: individual Progress: 80%  Related Problem: Reduce overall frequency, intensity, and duration of the anxiety so that daily functioning is not impaired. Description: Identify, challenge, and replace biased, fearful self-talk with positive, realistic, and empowering self-talk. Target Date: 2023-02-03 Frequency: Daily Modality: individual Progress: 100%  Related Problem: Reduce overall frequency, intensity,  and duration of the anxiety so that daily functioning is not impaired. Description: Describe situations, thoughts, feelings, and actions associated with anxieties and worries, their impact on functioning, and attempts to resolve them. Target Date: 2023-08-05 Frequency: Daily Modality: individual Progress: 90%  Client Response full compliance  Service Location Location, [address] [place], [place] [address]  Service Code cpt [phone]  Lifestyle change (exercise, nutrition)  Self-monitoring  Normalize/Reframe  Facilitate problem solving  Identify/label emotions  Validate/empathize  Emotion regulation skills  Self care activities  Related past to present  Session Notes F33.2, F41.1.  Goals: Resolve grief related to the loss of her mother, foster independence, resolve guilt, manage unresolved family of origin issues, resolve feelings associated with failed marriage, address issues related to establishing new romantic relationship. Also, has some conflicts with her sons that are not resolved. Goal Date is 12-24.  Patient agrees to a video session and is aware of the limitations. She is at home and I am in my home office.   Pamela Ray says she has been struggling the past week. She is back to discussing her belief that "step families don't work". She feels strongly that she has missed out of being "#1" In someone's life. She did share with Pamela Ray how she has been feeling and it went well. She isn't sure why she felt afraid to talk with him. She has strong feelings about [employer] a house for his kids. She feels that Pamela Ray is not "teaching" his kids good values by buying them a house. We discussed how to manage her anger and frustration at [employer] parenting choices.                                                  Pamela Ridgel, PhD Time 5:10-6:00p 50 min.

## 2023-05-16 ENCOUNTER — Other Ambulatory Visit: Payer: Self-pay | Admitting: Family Medicine

## 2023-05-20 ENCOUNTER — Inpatient Hospital Stay (HOSPITAL_BASED_OUTPATIENT_CLINIC_OR_DEPARTMENT_OTHER): Payer: 59 | Admitting: Medical Oncology

## 2023-05-20 ENCOUNTER — Inpatient Hospital Stay: Payer: 59 | Attending: Hematology & Oncology

## 2023-05-20 ENCOUNTER — Encounter: Payer: Self-pay | Admitting: Medical Oncology

## 2023-05-20 ENCOUNTER — Other Ambulatory Visit: Payer: Self-pay

## 2023-05-20 VITALS — BP 104/75 | HR 73 | Temp 98.0°F | Resp 18 | Ht 66.0 in | Wt 147.1 lb

## 2023-05-20 DIAGNOSIS — Z86000 Personal history of in-situ neoplasm of breast: Secondary | ICD-10-CM | POA: Insufficient documentation

## 2023-05-20 DIAGNOSIS — D509 Iron deficiency anemia, unspecified: Secondary | ICD-10-CM | POA: Insufficient documentation

## 2023-05-20 DIAGNOSIS — D508 Other iron deficiency anemias: Secondary | ICD-10-CM | POA: Diagnosis not present

## 2023-05-20 DIAGNOSIS — R002 Palpitations: Secondary | ICD-10-CM | POA: Diagnosis not present

## 2023-05-20 DIAGNOSIS — D0512 Intraductal carcinoma in situ of left breast: Secondary | ICD-10-CM

## 2023-05-20 DIAGNOSIS — Z923 Personal history of irradiation: Secondary | ICD-10-CM | POA: Insufficient documentation

## 2023-05-20 LAB — CBC WITH DIFFERENTIAL (CANCER CENTER ONLY)
Abs Immature Granulocytes: 0.07 10*3/uL (ref 0.00–0.07)
Basophils Absolute: 0 10*3/uL (ref 0.0–0.1)
Basophils Relative: 1 %
Eosinophils Absolute: 0.3 10*3/uL (ref 0.0–0.5)
Eosinophils Relative: 5 %
HCT: 43 % (ref 36.0–46.0)
Hemoglobin: 13.7 g/dL (ref 12.0–15.0)
Immature Granulocytes: 1 %
Lymphocytes Relative: 26 %
Lymphs Abs: 1.7 10*3/uL (ref 0.7–4.0)
MCH: 27.7 pg (ref 26.0–34.0)
MCHC: 31.9 g/dL (ref 30.0–36.0)
MCV: 87 fL (ref 80.0–100.0)
Monocytes Absolute: 0.4 10*3/uL (ref 0.1–1.0)
Monocytes Relative: 7 %
Neutro Abs: 4 10*3/uL (ref 1.7–7.7)
Neutrophils Relative %: 60 %
Platelet Count: 220 10*3/uL (ref 150–400)
RBC: 4.94 MIL/uL (ref 3.87–5.11)
RDW: 16.1 % — ABNORMAL HIGH (ref 11.5–15.5)
WBC Count: 6.5 10*3/uL (ref 4.0–10.5)
nRBC: 0 % (ref 0.0–0.2)

## 2023-05-20 LAB — IRON AND IRON BINDING CAPACITY (CC-WL,HP ONLY)
Iron: 75 ug/dL (ref 28–170)
Saturation Ratios: 19 % (ref 10.4–31.8)
TIBC: 386 ug/dL (ref 250–450)
UIBC: 311 ug/dL (ref 148–442)

## 2023-05-20 LAB — CMP (CANCER CENTER ONLY)
ALT: 8 U/L (ref 0–44)
AST: 13 U/L — ABNORMAL LOW (ref 15–41)
Albumin: 4.3 g/dL (ref 3.5–5.0)
Alkaline Phosphatase: 73 U/L (ref 38–126)
Anion gap: 5 (ref 5–15)
BUN: 13 mg/dL (ref 6–20)
CO2: 32 mmol/L (ref 22–32)
Calcium: 9.3 mg/dL (ref 8.9–10.3)
Chloride: 104 mmol/L (ref 98–111)
Creatinine: 0.57 mg/dL (ref 0.44–1.00)
GFR, Estimated: >60 mL/min (ref >60)
Glucose, Bld: 117 mg/dL — ABNORMAL HIGH (ref 70–99)
Potassium: 4.6 mmol/L (ref 3.5–5.1)
Sodium: 141 mmol/L (ref 135–145)
Total Bilirubin: 0.5 mg/dL (ref 0.3–1.2)
Total Protein: 6.9 g/dL (ref 6.5–8.1)

## 2023-05-20 LAB — RETICULOCYTES
Immature Retic Fract: 13.8 % (ref 2.3–15.9)
RBC.: 4.84 MIL/uL (ref 3.87–5.11)
Retic Count, Absolute: 68.7 10*3/uL (ref 19.0–186.0)
Retic Ct Pct: 1.4 % (ref 0.4–3.1)

## 2023-05-20 LAB — FERRITIN: Ferritin: 57 ng/mL (ref 11–307)

## 2023-05-20 NOTE — Progress Notes (Signed)
Hematology and Oncology Follow Up Visit  Pamela Ray [id number] [birth year] 57 y.o. 05/20/2023   Principle Diagnosis:  DCIS of the left breast Recurrent iron deficiency anemia Vitamin D deficeincy   Past Therapy: Fareston 60mg  po q day - completed 9 + years in 2023  Current Therapy:        IV iron as indicated - Injectafer (reacted to Feraheme) Vitamin D 50,000 units PO weekly    Interim History:  Pamela Ray is here today for follow-up.   At her last visit she mentioned symptoms of hot flashes, palpitations, etc. She had been seen by cardiology as well as GYN. She reports that symptoms are stable. She feels that symptoms are related to hormonal changes.  Has been getting her menstrual cycle irregularly. Average flow - lasting about 14-18 days.  No c/o pain.  No breast changes. No unintentional weight loss She is doing well at work and home. No c/o fatigue at this time.  No issue with blood loss. No bruising or petechiae.  Appetite and hydration are good.   Wt Readings from Last 3 Encounters:  05/20/23 147 lb 1.3 oz (66.7 kg)  02/12/23 151 lb (68.5 kg)  02/05/23 150 lb 4 oz (68.2 kg)   ECOG Performance Status: 1 - Symptomatic but completely ambulatory  Medications:  Allergies as of 05/20/2023       Reactions   Amoxicillin Itching   Cephalexin Itching   Penicillins Hives   Venofer [iron Sucrose] Palpitations   Patient medicated with acetaminophen and diphenhydramine. Able to restart infusion and patient tolerated. See progress noted from 12/14/2021.        Medication List        Accurate as of May 20, 2023 11:45 AM. If you have any questions, ask your nurse or doctor.          Ajovy 225 MG/1.5ML Soaj Generic drug: Fremanezumab-vfrm Inject into the skin.   Boric Acid Gran Insert 1 capsule in vagina every night x 14 days then every Monday and Thursday night indefinitely   Botulinum Toxin Type A (Cosm) 100 units Solr Every 3 months   chlorproMAZINE 25  MG tablet Commonly known as: THORAZINE   cloNIDine 0.1 MG tablet Commonly known as: CATAPRES Take 0.1 mg by mouth at bedtime.   estradiol 0.1 MG/GM vaginal cream Commonly known as: ESTRACE Place 1 g vaginally 2 (two) times a week.   hydrocortisone 2.5 % ointment   ibuprofen 200 MG tablet Commonly known as: ADVIL Take 200 mg by mouth every 6 (six) hours as needed.   magnesium 84 MG ( ) Tbcr SR tablet Commonly known as: MAGTAB Take 144 mg by mouth daily. Pt takes Neuro Mag Threonate daily at bedtime   metoprolol tartrate 25 MG tablet Commonly known as: LOPRESSOR Take 0.5 tablets (12.5 mg total) by mouth daily as needed.   rosuvastatin 10 MG tablet Commonly known as: CRESTOR TAKE 1 TABLET BY MOUTH DAILY   SUMAtriptan 100 MG tablet Commonly known as: IMITREX Take by mouth.   Vitamin D (Ergocalciferol) 1.25 MG (50000 UNIT) Caps capsule Commonly known as: DRISDOL TAKE 1 CAPSULE BY MOUTH ONCE  WEEKLY        Allergies:  Allergies  Allergen Reactions   Amoxicillin Itching   Cephalexin Itching   Penicillins Hives   Venofer [Iron Sucrose] Palpitations    Patient medicated with acetaminophen and diphenhydramine. Able to restart infusion and patient tolerated. See progress noted from 12/14/2021.    Past Medical History, Surgical history, Social  history, and Family History were reviewed and updated.  Review of Systems: All other 10 point review of systems is negative.   Physical Exam:  height is 5\' 6"  (1.676 m) and weight is 147 lb 1.3 oz (66.7 kg). Her oral temperature is 98 F (36.7 C). Her blood pressure is 104/75 and her pulse is 73. Her respiration is 18 and oxygen saturation is 96%.   Wt Readings from Last 3 Encounters:  05/20/23 147 lb 1.3 oz (66.7 kg)  02/12/23 151 lb (68.5 kg)  02/05/23 150 lb 4 oz (68.2 kg)    Ocular: Sclerae unicteric, pupils equal, round and reactive to light Ear-nose-throat: Oropharynx clear, dentition fair Lymphatic: No cervical  or supraclavicular adenopathy Lungs no rales or rhonchi, good excursion bilaterally Heart regular rate and rhythm, no murmur appreciated Abd soft, nontender, positive bowel sounds MSK no focal spinal tenderness, no joint edema Neuro: non-focal, well-oriented, appropriate affect Breasts: Deferred   Lab Results  Component Value Date   WBC 6.5 05/20/2023   HGB 13.7 05/20/2023   HCT 43.0 05/20/2023   MCV 87.0 05/20/2023   PLT 220 05/20/2023   Lab Results  Component Value Date   FERRITIN 7 (L) 02/05/2023   IRON 46 02/05/2023   TIBC 445 02/05/2023   UIBC 419 11/29/2022   IRONPCTSAT 10 (L) 02/05/2023   Lab Results  Component Value Date   RETICCTPCT 1.4 05/20/2023   RBC 4.84 05/20/2023   RBC 4.94 05/20/2023   RETICCTABS 48.9 05/12/2015   No results found for: "KPAFRELGTCHN", "LAMBDASER", "KAPLAMBRATIO" No results found for: "IGGSERUM", "IGA", "IGMSERUM" No results found for: "TOTALPROTELP", "ALBUMINELP", "A1GS", "A2GS", "BETS", "BETA2SER", "GAMS", "MSPIKE", "SPEI"   Chemistry      Component Value Date/Time   NA 141 05/20/2023 1031   NA 143 10/23/2022 1612   NA 141 07/25/2017 1131   NA 140 12/04/2016 0747   K 4.6 05/20/2023 1031   K 3.9 07/25/2017 1131   K 4.2 12/04/2016 0747   CL 104 05/20/2023 1031   CL 108 07/25/2017 1131   CO2 32 05/20/2023 1031   CO2 27 07/25/2017 1131   CO2 25 12/04/2016 0747   BUN 13 05/20/2023 1031   BUN 12 10/23/2022 1612   BUN 12 07/25/2017 1131   BUN 13.2 12/04/2016 0747   CREATININE 0.57 05/20/2023 1031   CREATININE 0.7 07/25/2017 1131   CREATININE 0.7 12/04/2016 0747      Component Value Date/Time   CALCIUM 9.3 05/20/2023 1031   CALCIUM 9.4 07/25/2017 1131   CALCIUM 9.3 12/04/2016 0747   ALKPHOS 73 05/20/2023 1031   ALKPHOS 65 07/25/2017 1131   ALKPHOS 64 12/04/2016 0747   AST 13 (L) 05/20/2023 1031   AST 17 12/04/2016 0747   ALT 8 05/20/2023 1031   ALT 20 07/25/2017 1131   ALT 10 12/04/2016 0747   BILITOT 0.5 05/20/2023 1031    BILITOT 0.73 12/04/2016 0747     Encounter Diagnoses  Name Primary?   Ductal carcinoma in situ (DCIS) of left breast Yes   Other iron deficiency anemia    Palpitations     Impression and Plan:  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 She had a lumpectomy followed by radiation and has completed therapy of Fareston.  Last mammogram was on Nov/2023- Has one scheduled for Nov 2024 Has follow up with GYN in Dec/Jan Iron studies pending.  Lab only in 2 months.  Follow-up in 6 months.    Rushie Chestnut, PA-C 10/1/202411:45 AM

## 2023-05-22 ENCOUNTER — Ambulatory Visit: Payer: 59 | Admitting: Psychology

## 2023-05-23 ENCOUNTER — Ambulatory Visit (INDEPENDENT_AMBULATORY_CARE_PROVIDER_SITE_OTHER): Payer: 59 | Admitting: Psychology

## 2023-05-23 DIAGNOSIS — F411 Generalized anxiety disorder: Secondary | ICD-10-CM | POA: Diagnosis not present

## 2023-05-23 DIAGNOSIS — F332 Major depressive disorder, recurrent severe without psychotic features: Secondary | ICD-10-CM | POA: Diagnosis not present

## 2023-05-23 NOTE — Progress Notes (Signed)
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 (Status: maintained) -- No Description Entered  Excessive and/or unrealistic worry that is difficult to control occurring more days than not for at least 6 months about a number of events or activities. (Status: maintained) -- No Description Entered  Feelings of hopelessness, worthlessness, or inappropriate guilt. (Status: maintained) -- No Description Entered  Medication Status compliance  Safety none  If Suicidal or Homicidal State Action Taken: unspecified  Current Risk: low Medications unspecified Objectives Related Problem: Develop healthy thinking patterns and beliefs about self, others, and the world that lead to the alleviation and help prevent the relapse of depression. Description: Learn and implement conflict resolution skills to resolve interpersonal problems. Target Date: 2023-08-05 Frequency: Daily Modality: individual Progress: 90%  Related Problem: Develop healthy thinking patterns and beliefs about self, others, and the world that lead to the alleviation and help prevent the relapse of depression. Description: Learn and implement problem-solving and decision-making skills. Target Date: 2023-08-05 Frequency: Daily Modality: individual Progress: 95%  Related Problem: Develop healthy thinking patterns and beliefs about self, others, and the world that lead to the alleviation and help prevent the relapse of depression. Description: Learn and implement behavioral strategies to overcome depression. Target Date: 2023-02-03 Frequency: Daily Modality: individual Progress:100%  Related Problem: Develop healthy thinking patterns and beliefs about self, others,  and the world that lead to the alleviation and help prevent the relapse of depression. Description: Identify and replace thoughts and beliefs that support depression. Target Date: 2023-08-05 Frequency: Daily Modality: individual Progress: 100%  Related Problem: Develop healthy thinking patterns and beliefs about self, others, and the world that lead to the alleviation and help prevent the relapse of depression. Description: Describe current and past experiences with depression including their impact on functioning and attempts to resolve it. Target Date: 2023-02-03 Frequency: Daily Modality: individual Progress: 100%  Related Problem: Reduce overall frequency, intensity, and duration of the anxiety so that daily functioning is not impaired. Description: Maintain involvement in work, family, and social activities. Target Date: 2023-02-03 Frequency: Daily Modality: individual Progress: 100%  Related Problem: Reduce overall frequency, intensity, and duration of the anxiety so that daily functioning is not impaired. Description: Learn to accept limitations in life and commit to tolerating, rather than avoiding, unpleasant emotions while accomplishing meaningful goals. Target Date: 2023-08-05 Frequency: Daily Modality: individual Progress: 85%  Related Problem: Reduce overall frequency, intensity, and duration of the anxiety so that daily functioning is not impaired. Description: Learn and implement problem-solving strategies for realistically addressing worries. Target Date: 2023-08-05 Frequency: Daily Modality: individual Progress: 80%  Related Problem: Reduce overall frequency, intensity, and duration of the anxiety so that daily functioning is not impaired. Description: Identify, challenge, and replace biased, fearful self-talk with positive, realistic, and empowering self-talk. Target Date: 2023-02-03 Frequency: Daily Modality: individual Progress: 100%  Related Problem: Reduce  overall frequency, intensity, and duration of the anxiety so that daily functioning is not impaired. Description: Describe situations, thoughts, feelings, and actions associated with anxieties and worries, their impact on functioning,  and attempts to resolve them. Target Date: 2023-08-05 Frequency: Daily Modality: individual Progress: 90%  Client Response full compliance  Service Location Location, [address] [place], [place] [address]  Service Code cpt [phone]  Lifestyle change (exercise, nutrition)  Self-monitoring  Normalize/Reframe  Facilitate problem solving  Identify/label emotions  Validate/empathize  Emotion regulation skills  Self care activities  Related past to present  Session Notes F33.2, F41.1.  Goals: Resolve grief related to the loss of her mother, foster independence, resolve guilt, manage unresolved family of origin issues, resolve feelings associated with failed marriage, address issues related to establishing new romantic relationship. Also, has some conflicts with her sons that are not resolved. Goal Date is 12-24.  Patient agrees to a video session and is aware of the limitations. She is at home and I am in my home office.   Pamela Ray states she has talked to [place] about some things bothering her and he was very receptive. There are still some issues that compromise her respect for him. She is struggling to come to terms with those issues without having a negative impact on the relationship. Pamela Ray does not appear to have the same struggles as Pamela Ray about conceptualizing themselves as a couple. This is often manifested around finances. Explored the origins of her concerns and ways she can work past those issues to fully accept and embrace the relationship with Pamela Ray.                                                    [name], PhD Time 7:40a-8:30a 50 min.

## 2023-06-05 ENCOUNTER — Ambulatory Visit: Payer: 59 | Admitting: Psychology

## 2023-06-19 ENCOUNTER — Ambulatory Visit: Payer: 59 | Admitting: Psychology

## 2023-07-01 ENCOUNTER — Telehealth: Payer: Self-pay | Admitting: *Deleted

## 2023-07-01 NOTE — Telephone Encounter (Signed)
Patient requested to have lab only appointment @ [facility], sent secure chat and in basket to  [facility].

## 2023-07-02 ENCOUNTER — Ambulatory Visit: Payer: 59 | Admitting: Psychology

## 2023-07-03 ENCOUNTER — Ambulatory Visit: Payer: 59 | Admitting: Psychology

## 2023-07-03 DIAGNOSIS — F332 Major depressive disorder, recurrent severe without psychotic features: Secondary | ICD-10-CM | POA: Diagnosis not present

## 2023-07-03 DIAGNOSIS — F411 Generalized anxiety disorder: Secondary | ICD-10-CM

## 2023-07-03 NOTE — Progress Notes (Signed)
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 (Status: maintained) -- No Description Entered  Excessive and/or unrealistic worry that is difficult to control occurring more days than not for at least 6 months about a number of events or activities. (Status: maintained) -- No Description Entered  Feelings of hopelessness, worthlessness, or inappropriate guilt. (Status: maintained) -- No Description Entered  Medication Status compliance  Safety none  If Suicidal or Homicidal State Action Taken: unspecified  Current Risk: low Medications unspecified Objectives Related Problem: Develop healthy thinking patterns and beliefs about self, others, and the world that lead to the alleviation and help prevent the relapse of depression. Description: Learn and implement conflict resolution skills to resolve interpersonal problems. Target Date: 2023-08-05 Frequency: Daily Modality: individual Progress: 90%  Related Problem: Develop healthy thinking patterns and beliefs about self, others, and the world that lead to the alleviation and help prevent the relapse of depression. Description: Learn and implement problem-solving and decision-making skills. Target Date: 2023-08-05 Frequency: Daily Modality: individual Progress: 95%  Related Problem: Develop healthy thinking patterns and beliefs about self, others, and the world that lead to the alleviation and help prevent the relapse of depression. Description: Learn and implement behavioral strategies to overcome depression. Target Date: 2023-02-03 Frequency: Daily Modality: individual Progress:100%  Related Problem: Develop healthy thinking patterns  and beliefs about self, others, and the world that lead to the alleviation and help prevent the relapse of depression. Description: Identify and replace thoughts and beliefs that support depression. Target Date: 2023-08-05 Frequency: Daily Modality: individual Progress: 100%  Related Problem: Develop healthy thinking patterns and beliefs about self, others, and the world that lead to the alleviation and help prevent the relapse of depression. Description: Describe current and past experiences with depression including their impact on functioning and attempts to resolve it. Target Date: 2023-02-03 Frequency: Daily Modality: individual Progress: 100%  Related Problem: Reduce overall frequency, intensity, and duration of the anxiety so that daily functioning is not impaired. Description: Maintain involvement in work, family, and social activities. Target Date: 2023-02-03 Frequency: Daily Modality: individual Progress: 100%  Related Problem: Reduce overall frequency, intensity, and duration of the anxiety so that daily functioning is not impaired. Description: Learn to accept limitations in life and commit to tolerating, rather than avoiding, unpleasant emotions while accomplishing meaningful goals. Target Date: 2023-08-05 Frequency: Daily Modality: individual Progress: 85%  Related Problem: Reduce overall frequency, intensity, and duration of the anxiety so that daily functioning is not impaired. Description: Learn and implement problem-solving strategies for realistically addressing worries. Target Date: 2023-08-05 Frequency: Daily Modality: individual Progress: 80%  Related Problem: Reduce overall frequency, intensity, and duration of the anxiety so that daily functioning is not impaired. Description: Identify, challenge, and replace biased, fearful self-talk with positive, realistic, and empowering self-talk. Target Date: 2023-02-03 Frequency: Daily Modality: individual Progress: 100%   Related Problem: Reduce overall frequency, intensity, and duration of the anxiety so that daily functioning is not impaired. Description:  Describe situations, thoughts, feelings, and actions associated with anxieties and worries, their impact on functioning, and attempts to resolve them. Target Date: 2023-08-05 Frequency: Daily Modality: individual Progress: 90%  Client Response full compliance  Service Location Location, [address] [place], [place] [address]  Service Code cpt [phone]  Lifestyle change (exercise, nutrition)  Self-monitoring  Normalize/Reframe  Facilitate problem solving  Identify/label emotions  Validate/empathize  Emotion regulation skills  Self care activities  Related past to present  Session Notes F33.2, F41.1.  Goals: Resolve grief related to the loss of her mother, foster independence, resolve guilt, manage unresolved family of origin issues, resolve feelings associated with failed marriage, address issues related to establishing new romantic relationship. Also, has some conflicts with her sons that are not resolved. Goal Date is 12-24.  Patient agrees to a video session and is aware of the limitations. She is at home and I am in my home office.   Olegario Messier: She has Covid 19 for the first time. She reports feeling great distress over the outcome of the election. We talked about how she can manage the Thanksgiving holiday given there are a number of family members that have very different political views. She is going to share with everyone that politics is an off-limits topic. We discussed some of her resentments about [name] and how to work past those feelings.                                                        [name], PhD Time 12:40p-1:30p 50 min.

## 2023-07-21 ENCOUNTER — Other Ambulatory Visit: Payer: 59

## 2023-07-31 ENCOUNTER — Ambulatory Visit: Payer: 59 | Admitting: Psychology

## 2023-07-31 DIAGNOSIS — F332 Major depressive disorder, recurrent severe without psychotic features: Secondary | ICD-10-CM

## 2023-07-31 DIAGNOSIS — F411 Generalized anxiety disorder: Secondary | ICD-10-CM

## 2023-07-31 NOTE — Progress Notes (Signed)
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 (Status: maintained) -- No Description Entered  Excessive and/or unrealistic worry that is difficult to control occurring more days than not for at least 6 months about a number of events or activities. (Status: maintained) -- No Description Entered  Feelings of hopelessness, worthlessness, or inappropriate guilt. (Status: maintained) -- No Description Entered  Medication Status compliance  Safety none  If Suicidal or Homicidal State Action Taken: unspecified  Current Risk: low Medications unspecified Objectives Related Problem: Develop healthy thinking patterns and beliefs about self, others, and the world that lead to the alleviation and help prevent the relapse of depression. Description: Learn and implement conflict resolution skills to resolve interpersonal problems. Target Date: 2023-08-05 Frequency: Daily Modality: individual Progress: 100%  Related Problem: Develop healthy thinking patterns and beliefs about self, others, and the world that lead to the alleviation and help prevent the relapse of depression. Description: Learn and implement problem-solving and decision-making skills. Target Date: 2024-08-04 Frequency: Daily Modality: individual Progress: 95%  Related Problem: Develop healthy thinking patterns and beliefs about self, others, and the world that lead to the alleviation and help prevent the relapse of depression. Description: Learn and implement behavioral strategies to overcome depression. Target Date: 2023-02-03 Frequency: Daily Modality: individual Progress:100%  Related Problem:  Develop healthy thinking patterns and beliefs about self, others, and the world that lead to the alleviation and help prevent the relapse of depression. Description: Identify and replace thoughts and beliefs that support depression. Target Date: 2023-08-05 Frequency: Daily Modality: individual Progress: 100%  Related Problem: Develop healthy thinking patterns and beliefs about self, others, and the world that lead to the alleviation and help prevent the relapse of depression. Description: Describe current and past experiences with depression including their impact on functioning and attempts to resolve it. Target Date: 2023-02-03 Frequency: Daily Modality: individual Progress: 100%  Related Problem: Reduce overall frequency, intensity, and duration of the anxiety so that daily functioning is not impaired. Description: Maintain involvement in work, family, and social activities. Target Date: 2023-02-03 Frequency: Daily Modality: individual Progress: 100%  Related Problem: Reduce overall frequency, intensity, and duration of the anxiety so that daily functioning is not impaired. Description: Learn to accept limitations in life and commit to tolerating, rather than avoiding, unpleasant emotions while accomplishing meaningful goals. Target Date: 2024-08-04 Frequency: Daily Modality: individual Progress: 90%  Related Problem: Reduce overall frequency, intensity, and duration of the anxiety so that daily functioning is not impaired. Description: Learn and implement problem-solving strategies for realistically addressing worries. Target Date: 2024-08-04 Frequency: Daily Modality: individual Progress: 85%  Related Problem: Reduce overall frequency, intensity, and duration of the anxiety so that daily functioning is not impaired. Description: Identify, challenge, and replace biased, fearful self-talk with positive, realistic, and empowering self-talk. Target Date: 2023-02-03 Frequency:  Daily Modality: individual Progress: 100%  Related Problem: Reduce overall  frequency, intensity, and duration of the anxiety so that daily functioning is not impaired. Description: Describe situations, thoughts, feelings, and actions associated with anxieties and worries, their impact on functioning, and attempts to resolve them. Target Date: 2024-08-04 Frequency: Daily Modality: individual Progress: 90%  Client Response full compliance  Service Location Location, [address] [place], [place] [address]  Service Code cpt [phone]  Lifestyle change (exercise, nutrition)  Self-monitoring  Normalize/Reframe  Facilitate problem solving  Identify/label emotions  Validate/empathize  Emotion regulation skills  Self care activities  Related past to present  Session Notes F33.2, F41.1.  Goals: Resolve grief related to the loss of her mother, foster independence, resolve guilt, manage unresolved family of origin issues, resolve feelings associated with failed marriage, address issues related to establishing new romantic relationship. Also, has some conflicts with her sons that are not resolved. Goal Date is 12-25.  Patient agrees to a video session and is aware of the limitations. She is at home and I am in my home office.   Pamela Ray: States that things at work is stressful and she is "burning the candle at both ends" and she is having trouble with her work life balance. She does feel it is time limited and she is improving at setting limits. She says the Thanksgiving holiday went fine. She continues to have significant issues with the fact that Pamela Ray's kids are gifted extreme privileges. This has a negative impact on her respect for Pamela Ray. Pamela Ray strongly feels that [name] do not deserve the gifts that they get from their father. Part of her struggle is her recognition that "it shouldn't matter" to her, but is is still upsetting.                                                                Pamela Ridgel, PhD Time 5:10p-6:00p 50 min.

## 2023-08-14 ENCOUNTER — Ambulatory Visit: Payer: 59 | Admitting: Psychology

## 2023-08-14 ENCOUNTER — Encounter: Payer: Self-pay | Admitting: Family

## 2023-08-15 ENCOUNTER — Inpatient Hospital Stay: Payer: 59 | Attending: Hematology & Oncology

## 2023-08-15 DIAGNOSIS — D509 Iron deficiency anemia, unspecified: Secondary | ICD-10-CM | POA: Diagnosis present

## 2023-08-15 DIAGNOSIS — Z86000 Personal history of in-situ neoplasm of breast: Secondary | ICD-10-CM | POA: Insufficient documentation

## 2023-08-15 DIAGNOSIS — D508 Other iron deficiency anemias: Secondary | ICD-10-CM

## 2023-08-15 LAB — IRON AND IRON BINDING CAPACITY (CC-WL,HP ONLY)
Iron: 33 ug/dL (ref 28–170)
Saturation Ratios: 6 % — ABNORMAL LOW (ref 10.4–31.8)
TIBC: 531 ug/dL — ABNORMAL HIGH (ref 250–450)
UIBC: 498 ug/dL — ABNORMAL HIGH (ref 148–442)

## 2023-08-15 LAB — CBC
HCT: 39.3 % (ref 36.0–46.0)
Hemoglobin: 12.7 g/dL (ref 12.0–15.0)
MCH: 26.7 pg (ref 26.0–34.0)
MCHC: 32.3 g/dL (ref 30.0–36.0)
MCV: 82.6 fL (ref 80.0–100.0)
Platelets: 295 10*3/uL (ref 150–400)
RBC: 4.76 MIL/uL (ref 3.87–5.11)
RDW: 13.4 % (ref 11.5–15.5)
WBC: 5.5 10*3/uL (ref 4.0–10.5)
nRBC: 0 % (ref 0.0–0.2)

## 2023-08-18 ENCOUNTER — Other Ambulatory Visit: Payer: Self-pay | Admitting: Medical Oncology

## 2023-08-18 ENCOUNTER — Encounter: Payer: Self-pay | Admitting: Medical Oncology

## 2023-08-18 LAB — FERRITIN: Ferritin: 8 ng/mL — ABNORMAL LOW (ref 11–307)

## 2023-08-22 ENCOUNTER — Inpatient Hospital Stay: Payer: 59 | Attending: Hematology & Oncology

## 2023-08-22 VITALS — BP 107/71 | HR 72 | Temp 98.3°F | Resp 16

## 2023-08-22 DIAGNOSIS — D509 Iron deficiency anemia, unspecified: Secondary | ICD-10-CM | POA: Insufficient documentation

## 2023-08-22 DIAGNOSIS — D508 Other iron deficiency anemias: Secondary | ICD-10-CM

## 2023-08-22 MED ORDER — IRON SUCROSE 20 MG/ML IV SOLN
200.0000 mg | Freq: Once | INTRAVENOUS | Status: AC
  Administered 2023-08-22: 200 mg via INTRAVENOUS
  Filled 2023-08-22: qty 10

## 2023-08-22 MED ORDER — DIPHENHYDRAMINE HCL 25 MG PO CAPS: 50.0000 mg | ORAL_CAPSULE | Freq: Once | ORAL | Status: DC

## 2023-08-22 MED ORDER — SODIUM CHLORIDE 0.9 % IV SOLN: INTRAVENOUS | Status: DC

## 2023-08-22 MED ORDER — ACETAMINOPHEN 325 MG PO TABS
650.0000 mg | ORAL_TABLET | Freq: Once | ORAL | Status: AC
Start: 2023-08-22 — End: 2023-08-22
  Administered 2023-08-22: 650 mg via ORAL
  Filled 2023-08-22: qty 2

## 2023-08-22 NOTE — Patient Instructions (Signed)
 Iron Sucrose Injection What is this medication? IRON SUCROSE (EYE ern SOO krose) treats low levels of iron (iron deficiency anemia) in people with kidney disease. Iron is a mineral that plays an important role in making red blood cells, which carry oxygen from your lungs to the rest of your body. This medicine may be used for other purposes; ask your health care provider or pharmacist if you have questions. COMMON BRAND NAME(S): Venofer What should I tell my care team before I take this medication? They need to know if you have any of these conditions: Anemia not caused by low iron levels Heart disease High levels of iron in the blood Kidney disease Liver disease An unusual or allergic reaction to iron, other medications, foods, dyes, or preservatives Pregnant or trying to get pregnant Breastfeeding How should I use this medication? This medication is for infusion into a vein. It is given in a hospital or clinic setting. Talk to your care team about the use of this medication in children. While this medication may be prescribed for children as young as 2 years for selected conditions, precautions do apply. Overdosage: If you think you have taken too much of this medicine contact a poison control center or emergency room at once. NOTE: This medicine is only for you. Do not share this medicine with others. What if I miss a dose? Keep appointments for follow-up doses. It is important not to miss your dose. Call your care team if you are unable to keep an appointment. What may interact with this medication? Do not take this medication with any of the following: Deferoxamine Dimercaprol Other iron products This medication may also interact with the following: Chloramphenicol Deferasirox This list may not describe all possible interactions. Give your health care provider a list of all the medicines, herbs, non-prescription drugs, or dietary supplements you use. Also tell them if you smoke,  drink alcohol, or use illegal drugs. Some items may interact with your medicine. What should I watch for while using this medication? Visit your care team regularly. Tell your care team if your symptoms do not start to get better or if they get worse. You may need blood work done while you are taking this medication. You may need to follow a special diet. Talk to your care team. Foods that contain iron include: whole grains/cereals, dried fruits, beans, or peas, leafy green vegetables, and organ meats (liver, kidney). What side effects may I notice from receiving this medication? Side effects that you should report to your care team as soon as possible: Allergic reactions--skin rash, itching, hives, swelling of the face, lips, tongue, or throat Low blood pressure--dizziness, feeling faint or lightheaded, blurry vision Shortness of breath Side effects that usually do not require medical attention (report to your care team if they continue or are bothersome): Flushing Headache Joint pain Muscle pain Nausea Pain, redness, or irritation at injection site This list may not describe all possible side effects. Call your doctor for medical advice about side effects. You may report side effects to FDA at 1-800-FDA-1088. Where should I keep my medication? This medication is given in a hospital or clinic. It will not be stored at home. NOTE: This sheet is a summary. It may not cover all possible information. If you have questions about this medicine, talk to your doctor, pharmacist, or health care provider.  2024 Elsevier/Gold Standard (2023-01-10 00:00:00)

## 2023-08-25 ENCOUNTER — Ambulatory Visit (INDEPENDENT_AMBULATORY_CARE_PROVIDER_SITE_OTHER): Payer: 59

## 2023-08-25 ENCOUNTER — Ambulatory Visit (INDEPENDENT_AMBULATORY_CARE_PROVIDER_SITE_OTHER): Payer: 59 | Admitting: Podiatry

## 2023-08-25 DIAGNOSIS — M778 Other enthesopathies, not elsewhere classified: Secondary | ICD-10-CM | POA: Diagnosis not present

## 2023-08-25 DIAGNOSIS — B351 Tinea unguium: Secondary | ICD-10-CM

## 2023-08-25 MED ORDER — TERBINAFINE HCL 250 MG PO TABS: ORAL_TABLET | ORAL | 0 refills | Status: DC

## 2023-08-27 NOTE — Progress Notes (Signed)
 Subjective:   Patient ID: Pamela Ray, female   DOB: 58 y.o.   MRN: [id number]   HPI Patient presents with nasal disease left big toenail right fifth nail with several other nails having some change stating that she did traumatized the left big toenail about 6 months and it has been growing this way.  Patient does not smoke likes to be active concern with mild discomfort also   Review of Systems  All other systems reviewed and are negative.       Objective:  Physical Exam Vitals and nursing note reviewed.  Constitutional:      Appearance: She is well-developed.  Pulmonary:     Effort: Pulmonary effort is normal.  Musculoskeletal:        General: Normal range of motion.  Skin:    General: Skin is warm.  Neurological:     Mental Status: She is alert.     Neurovascular status intact muscle strength was found to be adequate range of motion adequate with nail disease left big toenail right fifth nail and several others that have mild distal disease.  Good digital perfusion well-oriented mild discomfort dorsal left      Assessment:  Probability for combination of trauma and secondary fungal infection     Plan:  H&P reviewed and discussed treatment options at great length going over oral medicine laser therapy trauma and nail removal.  At this point organ to try antifungal PulsePak in combo with laser with all questions answered today.  Hopefully this will give her a improved result with ultimate nail removal possible left big toenail depending on how it responds

## 2023-08-28 ENCOUNTER — Ambulatory Visit: Payer: 59 | Admitting: Psychology

## 2023-08-29 ENCOUNTER — Inpatient Hospital Stay: Payer: 59

## 2023-08-29 VITALS — BP 102/62 | HR 77 | Temp 98.7°F | Resp 20

## 2023-08-29 DIAGNOSIS — D509 Iron deficiency anemia, unspecified: Secondary | ICD-10-CM | POA: Diagnosis not present

## 2023-08-29 DIAGNOSIS — D508 Other iron deficiency anemias: Secondary | ICD-10-CM

## 2023-08-29 MED ORDER — IRON SUCROSE 20 MG/ML IV SOLN
200.0000 mg | Freq: Once | INTRAVENOUS | Status: AC
  Administered 2023-08-29: 200 mg via INTRAVENOUS
  Filled 2023-08-29: qty 10

## 2023-08-29 MED ORDER — ALBUTEROL SULFATE (2.5 MG/3ML) 0.083% IN NEBU: 2.5000 mg | INHALATION_SOLUTION | Freq: Once | RESPIRATORY_TRACT | Status: DC | PRN

## 2023-08-29 MED ORDER — DIPHENHYDRAMINE HCL 25 MG PO CAPS: 50.0000 mg | ORAL_CAPSULE | Freq: Once | ORAL | Status: DC

## 2023-08-29 MED ORDER — FAMOTIDINE IN NACL 20-0.9 MG/50ML-% IV SOLN: 20.0000 mg | Freq: Once | INTRAVENOUS | Status: DC | PRN

## 2023-08-29 MED ORDER — EPINEPHRINE 0.3 MG/0.3ML IJ SOAJ: 0.3000 mg | Freq: Once | INTRAMUSCULAR | Status: DC | PRN

## 2023-08-29 MED ORDER — SODIUM CHLORIDE 0.9 % IV SOLN: INTRAVENOUS | Status: DC

## 2023-08-29 MED ORDER — SODIUM CHLORIDE 0.9 % IV SOLN: Freq: Once | INTRAVENOUS | Status: DC | PRN

## 2023-08-29 MED ORDER — METHYLPREDNISOLONE SODIUM SUCC 125 MG IJ SOLR: 125.0000 mg | Freq: Once | INTRAMUSCULAR | Status: DC | PRN

## 2023-08-29 MED ORDER — DIPHENHYDRAMINE HCL 50 MG/ML IJ SOLN: 50.0000 mg | Freq: Once | INTRAMUSCULAR | Status: DC | PRN

## 2023-08-29 MED ORDER — ACETAMINOPHEN 325 MG PO TABS: 650.0000 mg | ORAL_TABLET | Freq: Once | ORAL | Status: DC

## 2023-08-29 NOTE — Patient Instructions (Signed)
 Iron Sucrose Injection What is this medication? IRON SUCROSE (EYE ern SOO krose) treats low levels of iron (iron deficiency anemia) in people with kidney disease. Iron is a mineral that plays an important role in making red blood cells, which carry oxygen from your lungs to the rest of your body. This medicine may be used for other purposes; ask your health care provider or pharmacist if you have questions. COMMON BRAND NAME(S): Venofer What should I tell my care team before I take this medication? They need to know if you have any of these conditions: Anemia not caused by low iron levels Heart disease High levels of iron in the blood Kidney disease Liver disease An unusual or allergic reaction to iron, other medications, foods, dyes, or preservatives Pregnant or trying to get pregnant Breastfeeding How should I use this medication? This medication is for infusion into a vein. It is given in a hospital or clinic setting. Talk to your care team about the use of this medication in children. While this medication may be prescribed for children as young as 2 years for selected conditions, precautions do apply. Overdosage: If you think you have taken too much of this medicine contact a poison control center or emergency room at once. NOTE: This medicine is only for you. Do not share this medicine with others. What if I miss a dose? Keep appointments for follow-up doses. It is important not to miss your dose. Call your care team if you are unable to keep an appointment. What may interact with this medication? Do not take this medication with any of the following: Deferoxamine Dimercaprol Other iron products This medication may also interact with the following: Chloramphenicol Deferasirox This list may not describe all possible interactions. Give your health care provider a list of all the medicines, herbs, non-prescription drugs, or dietary supplements you use. Also tell them if you smoke,  drink alcohol, or use illegal drugs. Some items may interact with your medicine. What should I watch for while using this medication? Visit your care team regularly. Tell your care team if your symptoms do not start to get better or if they get worse. You may need blood work done while you are taking this medication. You may need to follow a special diet. Talk to your care team. Foods that contain iron include: whole grains/cereals, dried fruits, beans, or peas, leafy green vegetables, and organ meats (liver, kidney). What side effects may I notice from receiving this medication? Side effects that you should report to your care team as soon as possible: Allergic reactions--skin rash, itching, hives, swelling of the face, lips, tongue, or throat Low blood pressure--dizziness, feeling faint or lightheaded, blurry vision Shortness of breath Side effects that usually do not require medical attention (report to your care team if they continue or are bothersome): Flushing Headache Joint pain Muscle pain Nausea Pain, redness, or irritation at injection site This list may not describe all possible side effects. Call your doctor for medical advice about side effects. You may report side effects to FDA at 1-800-FDA-1088. Where should I keep my medication? This medication is given in a hospital or clinic. It will not be stored at home. NOTE: This sheet is a summary. It may not cover all possible information. If you have questions about this medicine, talk to your doctor, pharmacist, or health care provider.  2024 Elsevier/Gold Standard (2023-01-10 00:00:00)

## 2023-09-02 ENCOUNTER — Other Ambulatory Visit: Payer: Self-pay | Admitting: Hematology & Oncology

## 2023-09-02 DIAGNOSIS — Z8249 Family history of ischemic heart disease and other diseases of the circulatory system: Secondary | ICD-10-CM

## 2023-09-02 DIAGNOSIS — E559 Vitamin D deficiency, unspecified: Secondary | ICD-10-CM

## 2023-09-02 DIAGNOSIS — D509 Iron deficiency anemia, unspecified: Secondary | ICD-10-CM

## 2023-09-02 DIAGNOSIS — F064 Anxiety disorder due to known physiological condition: Secondary | ICD-10-CM

## 2023-09-02 DIAGNOSIS — R002 Palpitations: Secondary | ICD-10-CM

## 2023-09-02 DIAGNOSIS — R739 Hyperglycemia, unspecified: Secondary | ICD-10-CM

## 2023-09-02 DIAGNOSIS — M810 Age-related osteoporosis without current pathological fracture: Secondary | ICD-10-CM

## 2023-09-02 DIAGNOSIS — E7801 Familial hypercholesterolemia: Secondary | ICD-10-CM

## 2023-09-02 DIAGNOSIS — M818 Other osteoporosis without current pathological fracture: Secondary | ICD-10-CM

## 2023-09-02 DIAGNOSIS — D508 Other iron deficiency anemias: Secondary | ICD-10-CM

## 2023-09-02 DIAGNOSIS — D0512 Intraductal carcinoma in situ of left breast: Secondary | ICD-10-CM

## 2023-09-03 ENCOUNTER — Encounter: Payer: Self-pay | Admitting: Family

## 2023-09-11 ENCOUNTER — Ambulatory Visit: Payer: 59 | Admitting: Psychology

## 2023-09-11 DIAGNOSIS — F411 Generalized anxiety disorder: Secondary | ICD-10-CM

## 2023-09-11 DIAGNOSIS — F332 Major depressive disorder, recurrent severe without psychotic features: Secondary | ICD-10-CM

## 2023-09-11 NOTE — Progress Notes (Signed)
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 (Status: maintained) -- No Description Entered  Excessive and/or unrealistic worry that is difficult to control occurring more days than not for at least 6 months about a number of events or activities. (Status: maintained) -- No Description Entered  Feelings of hopelessness, worthlessness, or inappropriate guilt. (Status: maintained) -- No Description Entered  Medication Status compliance  Safety none  If Suicidal or Homicidal State Action Taken: unspecified  Current Risk: low Medications unspecified Objectives Related Problem: Develop healthy thinking patterns and beliefs about self, others, and the world that lead to the alleviation and help prevent the relapse of depression. Description: Learn and implement conflict resolution skills to resolve interpersonal problems. Target Date: 2023-08-05 Frequency: Daily Modality: individual Progress: 100%  Related Problem: Develop healthy thinking patterns and beliefs about self, others, and the world that lead to the alleviation and help prevent the relapse of depression. Description: Learn and implement problem-solving and decision-making skills. Target Date: 2024-08-04 Frequency: Daily Modality: individual Progress: 95%  Related Problem: Develop healthy thinking patterns and beliefs about self, others, and the world that lead to the alleviation and help prevent the relapse of depression. Description: Learn and implement behavioral strategies to overcome depression. Target Date: 2023-02-03 Frequency: Daily Modality:  individual Progress:100%  Related Problem: Develop healthy thinking patterns and beliefs about self, others, and the world that lead to the alleviation and help prevent the relapse of depression. Description: Identify and replace thoughts and beliefs that support depression. Target Date: 2023-08-05 Frequency: Daily Modality: individual Progress: 100%  Related Problem: Develop healthy thinking patterns and beliefs about self, others, and the world that lead to the alleviation and help prevent the relapse of depression. Description: Describe current and past experiences with depression including their impact on functioning and attempts to resolve it. Target Date: 2023-02-03 Frequency: Daily Modality: individual Progress: 100%  Related Problem: Reduce overall frequency, intensity, and duration of the anxiety so that daily functioning is not impaired. Description: Maintain involvement in work, family, and social activities. Target Date: 2023-02-03 Frequency: Daily Modality: individual Progress: 100%  Related Problem: Reduce overall frequency, intensity, and duration of the anxiety so that daily functioning is not impaired. Description: Learn to accept limitations in life and commit to tolerating, rather than avoiding, unpleasant emotions while accomplishing meaningful goals. Target Date: 2024-08-04 Frequency: Daily Modality: individual Progress: 90%  Related Problem: Reduce overall frequency, intensity, and duration of the anxiety so that daily functioning is not impaired. Description: Learn and implement problem-solving strategies for realistically addressing worries. Target Date: 2024-08-04 Frequency: Daily Modality: individual Progress: 85%  Related Problem: Reduce overall frequency, intensity, and duration of the anxiety so that daily functioning is not impaired. Description: Identify, challenge, and replace biased, fearful self-talk with positive, realistic, and empowering  self-talk. Target Date: 2023-02-03 Frequency: Daily Modality: individual Progress: 100%  Related Problem: Reduce overall frequency, intensity, and duration of the anxiety so that daily functioning is not impaired. Description: Describe situations, thoughts, feelings, and actions associated with anxieties and worries, their impact on functioning, and attempts to resolve them. Target Date: 2024-08-04 Frequency: Daily Modality: individual Progress: 90%  Client Response full compliance  Service Location Location, [address] [place], [place] [address]  Service Code cpt [phone]  Lifestyle change (exercise, nutrition)  Self-monitoring  Normalize/Reframe  Facilitate problem solving  Identify/label emotions  Validate/empathize  Emotion regulation skills  Self care activities  Related past to present  Session Notes F33.2, F41.1.  Goals: Resolve grief related to the loss of her mother, foster independence, resolve guilt, manage unresolved family of origin issues, resolve feelings associated with failed marriage, address issues related to establishing new romantic relationship. Also, has some conflicts with her sons that are not resolved. Goal Date is 12-25.  Patient agrees to a video session and is aware of the limitations. She is at home and I am in my home office.   Olegario Messier: She states that all of the holidays went well for her and the family. There were some issues with Ryan's friend who was invited for the holiday, but it got resolved. The "other drama"  was that she ordered Autumn a gift that she thought she wanted. It turned out to be the wrong item. Autumn was not at all grateful and had an attitude toward [place]. She says that Autumn is considering medical school and is taking courses to prepare. She also bought a gun and Olegario Messier worries that Autumn might become suicidal at some point.                                                                 [name], PhD Time  5:10p-6:00p 50 min.

## 2023-09-12 ENCOUNTER — Inpatient Hospital Stay: Payer: 59

## 2023-09-12 VITALS — BP 118/74 | HR 65 | Temp 97.6°F | Resp 15

## 2023-09-12 DIAGNOSIS — D509 Iron deficiency anemia, unspecified: Secondary | ICD-10-CM | POA: Diagnosis not present

## 2023-09-12 DIAGNOSIS — D508 Other iron deficiency anemias: Secondary | ICD-10-CM

## 2023-09-12 MED ORDER — SODIUM CHLORIDE 0.9 % IV SOLN: INTRAVENOUS | Status: DC

## 2023-09-12 MED ORDER — IRON SUCROSE 20 MG/ML IV SOLN
200.0000 mg | Freq: Once | INTRAVENOUS | Status: AC
  Administered 2023-09-12: 200 mg via INTRAVENOUS
  Filled 2023-09-12: qty 10

## 2023-09-12 NOTE — Patient Instructions (Signed)

## 2023-09-19 ENCOUNTER — Inpatient Hospital Stay: Payer: 59

## 2023-09-19 VITALS — BP 116/74 | HR 67 | Temp 98.2°F | Resp 16

## 2023-09-19 DIAGNOSIS — D509 Iron deficiency anemia, unspecified: Secondary | ICD-10-CM | POA: Diagnosis not present

## 2023-09-19 DIAGNOSIS — D508 Other iron deficiency anemias: Secondary | ICD-10-CM

## 2023-09-19 MED ORDER — DIPHENHYDRAMINE HCL 25 MG PO CAPS: 50.0000 mg | ORAL_CAPSULE | Freq: Once | ORAL | Status: DC

## 2023-09-19 MED ORDER — SODIUM CHLORIDE 0.9 % IV SOLN: INTRAVENOUS | Status: DC

## 2023-09-19 MED ORDER — IRON SUCROSE 20 MG/ML IV SOLN
200.0000 mg | Freq: Once | INTRAVENOUS | Status: AC
Start: 2023-09-19 — End: 2023-09-19
  Administered 2023-09-19: 200 mg via INTRAVENOUS
  Filled 2023-09-19: qty 10

## 2023-09-19 MED ORDER — ACETAMINOPHEN 325 MG PO TABS: 650.0000 mg | ORAL_TABLET | Freq: Once | ORAL | Status: DC

## 2023-09-19 NOTE — Patient Instructions (Signed)

## 2023-09-25 ENCOUNTER — Ambulatory Visit: Payer: 59 | Admitting: Psychology

## 2023-09-25 DIAGNOSIS — F332 Major depressive disorder, recurrent severe without psychotic features: Secondary | ICD-10-CM | POA: Diagnosis not present

## 2023-09-25 DIAGNOSIS — F411 Generalized anxiety disorder: Secondary | ICD-10-CM

## 2023-09-25 NOTE — Progress Notes (Signed)
 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 (Status: maintained) -- No Description Entered  Excessive and/or unrealistic worry that is difficult to control occurring more days than not for at least 6 months about a number of events or activities. (Status: maintained) -- No Description Entered  Feelings of hopelessness, worthlessness, or inappropriate guilt. (Status: maintained) -- No Description Entered  Medication Status compliance  Safety none  If Suicidal or Homicidal State Action Taken: unspecified  Current Risk: low Medications unspecified Objectives Related Problem: Develop healthy thinking patterns and beliefs about self, others, and the world that lead to the alleviation and help prevent the relapse of depression. Description: Learn and implement conflict resolution skills to resolve interpersonal problems. Target Date: 2023-08-05 Frequency: Daily Modality: individual Progress: 100%  Related Problem: Develop healthy thinking patterns and beliefs about self, others, and the world that lead to the alleviation and help prevent the relapse of depression. Description: Learn and implement problem-solving and decision-making skills. Target Date: 2024-08-04 Frequency: Daily Modality: individual Progress: 95%  Related Problem: Develop healthy thinking patterns and beliefs about self, others, and the world that lead to the alleviation and help prevent the relapse of depression. Description: Learn and implement behavioral strategies to overcome depression. Target Date: 2023-02-03 Frequency:  Daily Modality: individual Progress:100%  Related Problem: Develop healthy thinking patterns and beliefs about self, others, and the world that lead to the alleviation and help prevent the relapse of depression. Description: Identify and replace thoughts and beliefs that support depression. Target Date: 2023-08-05 Frequency: Daily Modality: individual Progress: 100%  Related Problem: Develop healthy thinking patterns and beliefs about self, others, and the world that lead to the alleviation and help prevent the relapse of depression. Description: Describe current and past experiences with depression including their impact on functioning and attempts to resolve it. Target Date: 2023-02-03 Frequency: Daily Modality: individual Progress: 100%  Related Problem: Reduce overall frequency, intensity, and duration of the anxiety so that daily functioning is not impaired. Description: Maintain involvement in work, family, and social activities. Target Date: 2023-02-03 Frequency: Daily Modality: individual Progress: 100%  Related Problem: Reduce overall frequency, intensity, and duration of the anxiety so that daily functioning is not impaired. Description: Learn to accept limitations in life and commit to tolerating, rather than avoiding, unpleasant emotions while accomplishing meaningful goals. Target Date: 2024-08-04 Frequency: Daily Modality: individual Progress: 90%  Related Problem: Reduce overall frequency, intensity, and duration of the anxiety so that daily functioning is not impaired. Description: Learn and implement problem-solving strategies for realistically addressing worries. Target Date: 2024-08-04 Frequency: Daily Modality: individual Progress: 85%  Related Problem: Reduce overall frequency, intensity, and duration of the anxiety so that daily functioning is not  impaired. Description: Identify, challenge, and replace biased, fearful self-talk with positive, realistic, and  empowering self-talk. Target Date: 2023-02-03 Frequency: Daily Modality: individual Progress: 100%  Related Problem: Reduce overall frequency, intensity, and duration of the anxiety so that daily functioning is not impaired. Description: Describe situations, thoughts, feelings, and actions associated with anxieties and worries, their impact on functioning, and attempts to resolve them. Target Date: 2024-08-04 Frequency: Daily Modality: individual Progress: 90%  Client Response full compliance  Service Location Location, [address] [place], [place] [address]  Service Code cpt [id number]  Lifestyle change (exercise, nutrition)  Self-monitoring  Normalize/Reframe  Facilitate problem solving  Identify/label emotions  Validate/empathize  Emotion regulation skills  Self care activities  Related past to present  Session Notes F33.2, F41.1.  Goals: Resolve grief related to the loss of her mother, foster independence, resolve guilt, manage unresolved family of origin issues, resolve feelings associated with failed marriage, address issues related to establishing new romantic relationship. Also, has some conflicts with her sons that are not resolved. Goal Date is 12-25.  Patient agrees to a video session and is aware of the limitations. She is at home and I am in my home office.   Nathanel: She said she had a day of a lot of anxiety. She is particularly distressed about the current state of affairs in [place] . Talked about her relationship with Autumn and how best to have difficult conversations. They don't treat Nathanel well and she wants grace and forgiveness from Autumn. Suggest that she she share her feelings and tell Autumn the intent.                                                                 CONI ALM KERNS, PhD Time 5:10p-6:00p 50 min.

## 2023-09-26 ENCOUNTER — Inpatient Hospital Stay: Payer: 59 | Attending: Hematology & Oncology

## 2023-09-26 VITALS — BP 110/76 | HR 66 | Temp 98.2°F | Resp 16

## 2023-09-26 DIAGNOSIS — D508 Other iron deficiency anemias: Secondary | ICD-10-CM

## 2023-09-26 DIAGNOSIS — D509 Iron deficiency anemia, unspecified: Secondary | ICD-10-CM | POA: Insufficient documentation

## 2023-09-26 MED ORDER — DIPHENHYDRAMINE HCL 25 MG PO CAPS: 50.0000 mg | ORAL_CAPSULE | Freq: Once | ORAL | Status: DC

## 2023-09-26 MED ORDER — SODIUM CHLORIDE 0.9 % IV SOLN: Freq: Once | INTRAVENOUS | Status: AC

## 2023-09-26 MED ORDER — IRON SUCROSE 20 MG/ML IV SOLN
200.0000 mg | Freq: Once | INTRAVENOUS | Status: AC
  Administered 2023-09-26: 200 mg via INTRAVENOUS
  Filled 2023-09-26: qty 10

## 2023-09-26 MED ORDER — ACETAMINOPHEN 325 MG PO TABS: 650.0000 mg | ORAL_TABLET | Freq: Once | ORAL | Status: DC

## 2023-09-26 NOTE — Patient Instructions (Signed)
 [facility] - A DEPT OF [facility]  Discharge Instructions: Thank you for choosing [facility] to provide your oncology and hematology care.   If you have a lab appointment with the Cancer Center, please go directly to the Cancer Center and check in at the registration area.  Wear comfortable clothing and clothing appropriate for easy access to any Portacath or PICC line.   We strive to give you quality time with your provider. You may need to reschedule your appointment if you arrive late (15 or more minutes).  Arriving late affects you and other patients whose appointments are after yours.  Also, if you miss three or more appointments without notifying the office, you may be dismissed from the clinic at the provider's discretion.      For prescription refill requests, have your pharmacy contact our office and allow 72 hours for refills to be completed.    Today you received the following  agents Venofer      To help prevent nausea and vomiting after your treatment, we encourage you to take your nausea medication as directed.  BELOW ARE SYMPTOMS THAT SHOULD BE REPORTED IMMEDIATELY: *FEVER GREATER THAN 100.4 F (38 C) OR HIGHER *CHILLS OR SWEATING *NAUSEA AND VOMITING THAT IS NOT CONTROLLED WITH YOUR NAUSEA MEDICATION *UNUSUAL SHORTNESS OF BREATH *UNUSUAL BRUISING OR BLEEDING *URINARY PROBLEMS (pain or burning when urinating, or frequent urination) *BOWEL PROBLEMS (unusual diarrhea, constipation, pain near the anus) TENDERNESS IN MOUTH AND THROAT WITH OR WITHOUT PRESENCE OF ULCERS (sore throat, sores in mouth, or a toothache) UNUSUAL RASH, SWELLING OR PAIN  UNUSUAL VAGINAL DISCHARGE OR ITCHING   Items with * indicate a potential emergency and should be followed up as soon as possible or go to the Emergency Department if any problems should occur.  Please show the CHEMOTHERAPY ALERT CARD or IMMUNOTHERAPY ALERT CARD at check-in to the  Emergency Department and triage nurse. Should you have questions after your visit or need to cancel or reschedule your appointment, please contact [facility] - A DEPT OF [facility]  [phone] and follow the prompts.  Office hours are 8:00 a.m. to 4:30 p.m. Monday - Friday. Please note that voicemails left after 4:00 p.m. may not be returned until the following business day.  We are closed weekends and major holidays. You have access to a nurse at all times for urgent questions. Please call the main number to the clinic [phone] and follow the prompts.  For any non-urgent questions, you may also contact your provider using MyChart. We now offer e-Visits for anyone 79 and older to request care online for non-urgent symptoms. For details visit [email or web address].   Also download the MyChart app! Go to the app store, search "MyChart", open the app, select , and log in with your MyChart username and password.

## 2023-10-09 ENCOUNTER — Ambulatory Visit: Payer: 59 | Admitting: Psychology

## 2023-10-23 ENCOUNTER — Ambulatory Visit: Payer: 59 | Admitting: Psychology

## 2023-10-23 DIAGNOSIS — F332 Major depressive disorder, recurrent severe without psychotic features: Secondary | ICD-10-CM

## 2023-10-23 DIAGNOSIS — F411 Generalized anxiety disorder: Secondary | ICD-10-CM | POA: Diagnosis not present

## 2023-10-23 NOTE — Progress Notes (Signed)
 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 (Status: maintained) -- No Description Entered  Excessive and/or unrealistic worry that is difficult to control occurring more days than not for at least 6 months about a number of events or activities. (Status: maintained) -- No Description Entered  Feelings of hopelessness, worthlessness, or inappropriate guilt. (Status: maintained) -- No Description Entered  Medication Status compliance  Safety none  If Suicidal or Homicidal State Action Taken: unspecified  Current Risk: low Medications unspecified Objectives Related Problem: Develop healthy thinking patterns and beliefs about self, others, and the world that lead to the alleviation and help prevent the relapse of depression. Description: Learn and implement conflict resolution skills to resolve interpersonal problems. Target Date: 2023-08-05 Frequency: Daily Modality: individual Progress: 100%  Related Problem: Develop healthy thinking patterns and beliefs about self, others, and the world that lead to the alleviation and help prevent the relapse of depression. Description: Learn and implement problem-solving and decision-making skills. Target Date: 2024-08-04 Frequency: Daily Modality: individual Progress: 95%  Related Problem: Develop healthy thinking patterns and beliefs about self, others, and the world that lead to the alleviation and help prevent the relapse of depression. Description: Learn and implement behavioral strategies to overcome depression. Target Date:  2023-02-03 Frequency: Daily Modality: individual Progress:100%  Related Problem: Develop healthy thinking patterns and beliefs about self, others, and the world that lead to the alleviation and help prevent the relapse of depression. Description: Identify and replace thoughts and beliefs that support depression. Target Date: 2023-08-05 Frequency: Daily Modality: individual Progress: 100%  Related Problem: Develop healthy thinking patterns and beliefs about self, others, and the world that lead to the alleviation and help prevent the relapse of depression. Description: Describe current and past experiences with depression including their impact on functioning and attempts to resolve it. Target Date: 2023-02-03 Frequency: Daily Modality: individual Progress: 100%  Related Problem: Reduce overall frequency, intensity, and duration of the anxiety so that daily functioning is not impaired. Description: Maintain involvement in work, family, and social activities. Target Date: 2023-02-03 Frequency: Daily Modality: individual Progress: 100%  Related Problem: Reduce overall frequency, intensity, and duration of the anxiety so that daily functioning is not impaired. Description: Learn to accept limitations in life and commit to tolerating, rather than avoiding, unpleasant emotions while accomplishing meaningful goals. Target Date: 2024-08-04 Frequency: Daily Modality: individual Progress: 90%  Related Problem: Reduce overall frequency, intensity, and duration of the anxiety so that daily functioning is not impaired. Description: Learn and implement problem-solving strategies for realistically addressing worries. Target Date: 2024-08-04 Frequency: Daily Modality: individual Progress: 85%  Related Problem:  Reduce overall frequency, intensity, and duration of the anxiety so that daily functioning is not impaired. Description: Identify, challenge, and replace biased, fearful self-talk with  positive, realistic, and empowering self-talk. Target Date: 2023-02-03 Frequency: Daily Modality: individual Progress: 100%  Related Problem: Reduce overall frequency, intensity, and duration of the anxiety so that daily functioning is not impaired. Description: Describe situations, thoughts, feelings, and actions associated with anxieties and worries, their impact on functioning, and attempts to resolve them. Target Date: 2024-08-04 Frequency: Daily Modality: individual Progress: 90%  Client Response full compliance  Service Location Location, [address] [place], [place] [address]  Service Code cpt [phone]  Lifestyle change (exercise, nutrition)  Self-monitoring  Normalize/Reframe  Facilitate problem solving  Identify/label emotions  Validate/empathize  Emotion regulation skills  Self care activities  Related past to present  Session Notes F33.2, F41.1.  Goals: Resolve grief related to the loss of her mother, foster independence, resolve guilt, manage unresolved family of origin issues, resolve feelings associated with failed marriage, address issues related to establishing new romantic relationship. Also, has some conflicts with her sons that are not resolved. Goal Date is 12-25.  Patient agrees to a video session and is aware of the limitations. She is at home and I am in my home office.   Pamela Ray: She states she has "high anxiety". This is primarily due to the state of the nation and the political environment. She says she cannot find a strategy to cope with her worry. She has surgery scheduled for April 17th and 90% chance of it not being cancer. She is not sorried and confident all will be okay. She is managing family matters very well.                                                                  [name], PhD Time 5:10p-6:00p 50 min.

## 2023-10-24 ENCOUNTER — Other Ambulatory Visit: Payer: 59

## 2023-11-06 ENCOUNTER — Ambulatory Visit: Payer: 59 | Admitting: Psychology

## 2023-11-14 ENCOUNTER — Other Ambulatory Visit

## 2023-11-17 ENCOUNTER — Ambulatory Visit (INDEPENDENT_AMBULATORY_CARE_PROVIDER_SITE_OTHER): Admitting: Psychology

## 2023-11-17 DIAGNOSIS — F332 Major depressive disorder, recurrent severe without psychotic features: Secondary | ICD-10-CM

## 2023-11-17 DIAGNOSIS — F411 Generalized anxiety disorder: Secondary | ICD-10-CM

## 2023-11-17 NOTE — Progress Notes (Signed)
 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 (Status: maintained) -- No Description Entered  Excessive and/or unrealistic worry that is difficult to control occurring more days than not for at least 6 months about a number of events or activities. (Status: maintained) -- No Description Entered  Feelings of hopelessness, worthlessness, or inappropriate guilt. (Status: maintained) -- No Description Entered  Medication Status compliance  Safety none  If Suicidal or Homicidal State Action Taken: unspecified  Current Risk: low Medications unspecified Objectives Related Problem: Develop healthy thinking patterns and beliefs about self, others, and the world that lead to the alleviation and help prevent the relapse of depression. Description: Learn and implement conflict resolution skills to resolve interpersonal problems. Target Date: 2023-08-05 Frequency: Daily Modality: individual Progress: 100%  Related Problem: Develop healthy thinking patterns and beliefs about self, others, and the world that lead to the alleviation and help prevent the relapse of depression. Description: Learn and implement problem-solving and decision-making skills. Target Date: 2024-08-04 Frequency: Daily Modality: individual Progress: 95%  Related Problem: Develop healthy thinking patterns and beliefs about self, others, and the world that lead to the alleviation and help prevent the relapse of depression. Description: Learn and implement behavioral strategies to  overcome depression. Target Date: 2023-02-03 Frequency: Daily Modality: individual Progress:100%  Related Problem: Develop healthy thinking patterns and beliefs about self, others, and the world that lead to the alleviation and help prevent the relapse of depression. Description: Identify and replace thoughts and beliefs that support depression. Target Date: 2023-08-05 Frequency: Daily Modality: individual Progress: 100%  Related Problem: Develop healthy thinking patterns and beliefs about self, others, and the world that lead to the alleviation and help prevent the relapse of depression. Description: Describe current and past experiences with depression including their impact on functioning and attempts to resolve it. Target Date: 2023-02-03 Frequency: Daily Modality: individual Progress: 100%  Related Problem: Reduce overall frequency, intensity, and duration of the anxiety so that daily functioning is not impaired. Description: Maintain involvement in work, family, and social activities. Target Date: 2023-02-03 Frequency: Daily Modality: individual Progress: 100%  Related Problem: Reduce overall frequency, intensity, and duration of the anxiety so that daily functioning is not impaired. Description: Learn to accept limitations in life and commit to tolerating, rather than avoiding, unpleasant emotions while accomplishing meaningful goals. Target Date: 2024-08-04 Frequency: Daily Modality: individual Progress: 90%  Related Problem: Reduce overall frequency, intensity, and duration of the anxiety so that daily functioning is not impaired. Description: Learn and implement problem-solving strategies for  realistically addressing worries. Target Date: 2024-08-04 Frequency: Daily Modality: individual Progress: 85%  Related Problem: Reduce overall frequency, intensity, and duration of the anxiety so that daily functioning is not impaired. Description: Identify, challenge, and replace  biased, fearful self-talk with positive, realistic, and empowering self-talk. Target Date: 2023-02-03 Frequency: Daily Modality: individual Progress: 100%  Related Problem: Reduce overall frequency, intensity, and duration of the anxiety so that daily functioning is not impaired. Description: Describe situations, thoughts, feelings, and actions associated with anxieties and worries, their impact on functioning, and attempts to resolve them. Target Date: 2024-08-04 Frequency: Daily Modality: individual Progress: 90%  Client Response full compliance  Service Location Location, [address] [place], [place] [address]  Service Code cpt [id number]  Lifestyle change (exercise, nutrition)  Self-monitoring  Normalize/Reframe  Facilitate problem solving  Identify/label emotions  Validate/empathize  Emotion regulation skills  Self care activities  Related past to present  Session Notes F33.2, F41.1.  Goals: Resolve grief related to the loss of her mother, foster independence, resolve guilt, manage unresolved family of origin issues, resolve feelings associated with failed marriage, address issues related to establishing new romantic relationship. Also, has some conflicts with her sons that are not resolved. Goal Date is 12-25.  Patient agrees to a video session and is aware of the limitations. She is at home and I am in my home office.   Pamela Ray: She is sick with a cold today, but wanted to talk to address some family/relationship issues. She is frustrated with [name], who she feels is still enabling his son Gala Romney. Gala Romney is trying to bring over his wife, who is not a [place] citizen. Pamela Ray is bothered that [name] implied that there are "thresholds" to relationships when one determines that "it isn't worth it". He was somewhat dismissive when she expressed her distress. We talked about re-introducing the topic and the best way to move forward. She is reminded why they are not cohabiting, even though they have  such a close relationship. It does bother her that she spends much more time with [name] at his house rather than her house. She says, however, that at this point she does not think they should cohabit. Says that she thinks it's an [employer] for [employer]. She was planning to delay conversation with [name] after the family comes to town for a visit this week, but [name] may have overheard some of this conversation. She is now thinking she will need to introduce this conversation sooner than later. We scripted best way to do withwithout sounding threatening or blaming.                                                                       [name], PhD Time 2:10p-3:00p 50 min.

## 2023-11-20 ENCOUNTER — Ambulatory Visit: Payer: 59 | Admitting: Psychology

## 2023-11-24 ENCOUNTER — Ambulatory Visit: Payer: 59 | Admitting: Medical Oncology

## 2023-11-24 ENCOUNTER — Other Ambulatory Visit: Payer: 59

## 2023-12-04 ENCOUNTER — Ambulatory Visit: Payer: 59 | Admitting: Psychology

## 2023-12-08 ENCOUNTER — Inpatient Hospital Stay: Attending: Hematology & Oncology

## 2023-12-08 ENCOUNTER — Telehealth: Payer: Self-pay

## 2023-12-08 ENCOUNTER — Encounter: Payer: Self-pay | Admitting: Medical Oncology

## 2023-12-08 ENCOUNTER — Other Ambulatory Visit: Payer: Self-pay

## 2023-12-08 ENCOUNTER — Inpatient Hospital Stay (HOSPITAL_BASED_OUTPATIENT_CLINIC_OR_DEPARTMENT_OTHER): Admitting: Medical Oncology

## 2023-12-08 VITALS — BP 125/83 | HR 76 | Temp 98.7°F | Resp 17 | Ht 65.0 in | Wt 146.0 lb

## 2023-12-08 DIAGNOSIS — E559 Vitamin D deficiency, unspecified: Secondary | ICD-10-CM

## 2023-12-08 DIAGNOSIS — D0512 Intraductal carcinoma in situ of left breast: Secondary | ICD-10-CM

## 2023-12-08 DIAGNOSIS — D508 Other iron deficiency anemias: Secondary | ICD-10-CM

## 2023-12-08 DIAGNOSIS — D509 Iron deficiency anemia, unspecified: Secondary | ICD-10-CM

## 2023-12-08 DIAGNOSIS — Z86000 Personal history of in-situ neoplasm of breast: Secondary | ICD-10-CM | POA: Insufficient documentation

## 2023-12-08 DIAGNOSIS — Z923 Personal history of irradiation: Secondary | ICD-10-CM | POA: Diagnosis not present

## 2023-12-08 LAB — CMP (CANCER CENTER ONLY)
ALT: 14 U/L (ref 0–44)
AST: 17 U/L (ref 15–41)
Albumin: 4.3 g/dL (ref 3.5–5.0)
Alkaline Phosphatase: 68 U/L (ref 38–126)
Anion gap: 9 (ref 5–15)
BUN: 12 mg/dL (ref 6–20)
CO2: 29 mmol/L (ref 22–32)
Calcium: 9.8 mg/dL (ref 8.9–10.3)
Chloride: 103 mmol/L (ref 98–111)
Creatinine: 0.58 mg/dL (ref 0.44–1.00)
GFR, Estimated: >60 mL/min (ref >60)
Glucose, Bld: 100 mg/dL — ABNORMAL HIGH (ref 70–99)
Potassium: 4.6 mmol/L (ref 3.5–5.1)
Sodium: 141 mmol/L (ref 135–145)
Total Bilirubin: 0.6 mg/dL (ref 0.0–1.2)
Total Protein: 6.7 g/dL (ref 6.5–8.1)

## 2023-12-08 LAB — CBC WITH DIFFERENTIAL (CANCER CENTER ONLY)
Abs Immature Granulocytes: 0.01 10*3/uL (ref 0.00–0.07)
Basophils Absolute: 0 10*3/uL (ref 0.0–0.1)
Basophils Relative: 1 %
Eosinophils Absolute: 0.1 10*3/uL (ref 0.0–0.5)
Eosinophils Relative: 1 %
HCT: 46.5 % — ABNORMAL HIGH (ref 36.0–46.0)
Hemoglobin: 15.3 g/dL — ABNORMAL HIGH (ref 12.0–15.0)
Immature Granulocytes: 0 %
Lymphocytes Relative: 48 %
Lymphs Abs: 3 10*3/uL (ref 0.7–4.0)
MCH: 27.7 pg (ref 26.0–34.0)
MCHC: 32.9 g/dL (ref 30.0–36.0)
MCV: 84.2 fL (ref 80.0–100.0)
Monocytes Absolute: 0.6 10*3/uL (ref 0.1–1.0)
Monocytes Relative: 9 %
Neutro Abs: 2.6 10*3/uL (ref 1.7–7.7)
Neutrophils Relative %: 41 %
Platelet Count: 229 10*3/uL (ref 150–400)
RBC: 5.52 MIL/uL — ABNORMAL HIGH (ref 3.87–5.11)
RDW: 16.7 % — ABNORMAL HIGH (ref 11.5–15.5)
WBC Count: 6.3 10*3/uL (ref 4.0–10.5)
nRBC: 0 % (ref 0.0–0.2)

## 2023-12-08 LAB — IRON AND IRON BINDING CAPACITY (CC-WL,HP ONLY)
Iron: 103 ug/dL (ref 28–170)
Saturation Ratios: 26 % (ref 10.4–31.8)
TIBC: 398 ug/dL (ref 250–450)
UIBC: 295 ug/dL (ref 148–442)

## 2023-12-08 LAB — FERRITIN: Ferritin: 106 ng/mL (ref 11–307)

## 2023-12-08 NOTE — Telephone Encounter (Signed)
-----   Message from Ivor Mars sent at 12/08/2023  4:02 PM EDT ----- Please call and let him know that the iron  level is okay.  Twilla Galea

## 2023-12-08 NOTE — Progress Notes (Signed)
 Hematology and Oncology Follow Up Visit  Pamela Ray [id number] [birth year] 58 y.o. 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 She has had a chest cold since March. Slowly improving without SOB, productivity, fever.  Hgb is up today.  She is a non-smoker. No history of sleep apnea. No recent exposure to elevation. Palpitations are stable.   She has a D&C scheduled for May 2nd, 2025 for a uterine polyp. She has had spotting off and on.  Last breast exam was November. She has her yearly well woman exam planned for after her D&C No c/o pain.  No breast changes. No unintentional weight loss. No night sweats.  She is doing well at work and home. No c/o fatigue at this time.  No issue with blood loss. No bruising or petechiae.  Appetite and hydration are good.   Wt Readings from Last 3 Encounters:  12/08/23 146 lb (66.2 kg)  05/20/23 147 lb 1.3 oz (66.7 kg)  02/12/23 151 lb (68.5 kg)   ECOG Performance Status: 1 - Symptomatic but completely ambulatory  Medications:  Allergies as of 12/08/2023       Reactions   Amoxicillin Itching   Cephalexin Itching   Penicillins Hives   Venofer  [iron  Sucrose] Palpitations   Patient medicated with acetaminophen  and diphenhydramine . Able to restart infusion and patient tolerated. See progress noted from 12/14/2021.        Medication List        Accurate as of December 08, 2023 11:23 AM. If you have any questions, ask your nurse or doctor.          STOP taking these medications    terbinafine  250 MG tablet Commonly known as: LAMISIL  Stopped by: Sharla Davis        TAKE these medications    Boric Acid Gran Insert 1 capsule in vagina every night x 14 days then every Monday and Thursday night indefinitely   Botulinum Toxin Type A (Cosm) 100 units Solr   chlorproMAZINE 25 MG tablet Commonly known as: THORAZINE   cloNIDine 0.1 MG tablet Commonly known as: CATAPRES Take 0.1 mg by mouth at bedtime.   estradiol  0.1 MG/GM vaginal cream Commonly known as: ESTRACE  Place 1 g vaginally 2 (two) times a week.   hydrocortisone 2.5 % ointment   magnesium 84 MG ( ) Tbcr SR tablet Commonly known as: MAGTAB Take 144 mg by mouth daily. Pt takes Neuro Mag Threonate daily at bedtime   rosuvastatin  10 MG tablet Commonly known as: CRESTOR  TAKE 1 TABLET BY MOUTH DAILY   SUMAtriptan 100 MG tablet Commonly known as: IMITREX Take by mouth.   Vitamin D  (Ergocalciferol ) 1.25 MG (50000 UNIT) Caps capsule Commonly known as: DRISDOL  TAKE 1 CAPSULE BY MOUTH ONCE  WEEKLY        Allergies:  Allergies  Allergen Reactions   Amoxicillin Itching   Cephalexin Itching   Penicillins Hives   Venofer  [Iron  Sucrose] Palpitations    Patient medicated with acetaminophen  and diphenhydramine . Able to restart infusion and patient tolerated. See progress noted from 12/14/2021.  Past Medical History, Surgical history, Social history, and Family History were reviewed and updated.  Review of Systems: All other 10 point review of systems is negative.   Physical Exam:  height is 5\' 5"  (1.651 m) and weight is 146 lb (66.2 kg). Her oral temperature is 98.7 F (37.1 C). Her blood pressure is 125/83 and her pulse is 76. Her respiration is 17 and oxygen saturation is 99%.   Wt Readings from Last 3 Encounters:  12/08/23 146 lb (66.2 kg)  05/20/23 147 lb 1.3 oz (66.7 kg)  02/12/23 151 lb (68.5 kg)    Ocular: Sclerae unicteric, pupils equal, round and reactive to light Ear-nose-throat: Oropharynx clear, dentition fair Lymphatic: No cervical or supraclavicular  adenopathy Lungs no rales or rhonchi, good excursion bilaterally Heart regular rate and rhythm, no murmur appreciated Abd soft, nontender, positive bowel sounds MSK no focal spinal tenderness, no joint edema Neuro: non-focal, well-oriented, appropriate affect Breasts: Deferred   Lab Results  Component Value Date   WBC 6.3 12/08/2023   HGB 15.3 (H) 12/08/2023   HCT 46.5 (H) 12/08/2023   MCV 84.2 12/08/2023   PLT 229 12/08/2023   Lab Results  Component Value Date   FERRITIN 8 (L) 08/15/2023   IRON  33 08/15/2023   TIBC 531 (H) 08/15/2023   UIBC 498 (H) 08/15/2023   IRONPCTSAT 6 (L) 08/15/2023   Lab Results  Component Value Date   RETICCTPCT 1.4 05/20/2023   RBC 5.52 (H) 12/08/2023   RETICCTABS 48.9 05/12/2015   No results found for: "KPAFRELGTCHN", "LAMBDASER", "KAPLAMBRATIO" No results found for: "IGGSERUM", "IGA", "IGMSERUM" No results found for: "TOTALPROTELP", "ALBUMINELP", "A1GS", "A2GS", "BETS", "BETA2SER", "GAMS", "MSPIKE", "SPEI"   Chemistry      Component Value Date/Time   NA 141 12/08/2023 0921   NA 143 10/23/2022 1612   NA 141 07/25/2017 1131   NA 140 12/04/2016 0747   K 4.6 12/08/2023 0921   K 3.9 07/25/2017 1131   K 4.2 12/04/2016 0747   CL 103 12/08/2023 0921   CL 108 07/25/2017 1131   CO2 29 12/08/2023 0921   CO2 27 07/25/2017 1131   CO2 25 12/04/2016 0747   BUN 12 12/08/2023 0921   BUN 12 10/23/2022 1612   BUN 12 07/25/2017 1131   BUN 13.2 12/04/2016 0747   CREATININE 0.58 12/08/2023 0921   CREATININE 0.7 07/25/2017 1131   CREATININE 0.7 12/04/2016 0747      Component Value Date/Time   CALCIUM  9.8 12/08/2023 0921   CALCIUM  9.4 07/25/2017 1131   CALCIUM  9.3 12/04/2016 0747   ALKPHOS 68 12/08/2023 0921   ALKPHOS 65 07/25/2017 1131   ALKPHOS 64 12/04/2016 0747   AST 17 12/08/2023 0921   AST 17 12/04/2016 0747   ALT 14 12/08/2023 0921   ALT 20 07/25/2017 1131   ALT 10 12/04/2016 0747   BILITOT 0.6 12/08/2023 0921   BILITOT 0.73 12/04/2016  0747     Encounter Diagnoses  Name Primary?   Ductal carcinoma in situ (DCIS) of left breast Yes   Iron  deficiency anemia, unspecified iron  deficiency anemia type    Vitamin D  deficiency     Impression and Plan:  Pamela Ray is a very pleasant 58 y.o.caucasian female with 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 She had a lumpectomy followed by radiation and has completed therapy of Fareston . She is also followed for IDA and vitamin D  deficiency.   Last mammogram was on Nov/2024- next scheduled for Nov 2025 Iron   studies pending. Will replace if needed  RTC 6 months MD/APP, labs (CBC, CMP, iron , ferritin, vitamin D )   Sharla Davis, PA-C 4/21/202511:23 AM

## 2023-12-08 NOTE — Telephone Encounter (Signed)
 Advised via MyChart.

## 2023-12-18 ENCOUNTER — Ambulatory Visit: Payer: 59 | Admitting: Psychology

## 2023-12-18 DIAGNOSIS — F411 Generalized anxiety disorder: Secondary | ICD-10-CM

## 2023-12-18 DIAGNOSIS — F332 Major depressive disorder, recurrent severe without psychotic features: Secondary | ICD-10-CM

## 2023-12-18 NOTE — Progress Notes (Signed)
 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 (Status: maintained) -- No Description Entered  Excessive and/or unrealistic worry that is difficult to control occurring more days than not for at least 6 months about a number of events or activities. (Status: maintained) -- No Description Entered  Feelings of hopelessness, worthlessness, or inappropriate guilt. (Status: maintained) -- No Description Entered  Medication Status compliance  Safety none  If Suicidal or Homicidal State Action Taken: unspecified  Current Risk: low Medications unspecified Objectives Related Problem: Develop healthy thinking patterns and beliefs about self, others, and the world that lead to the alleviation and help prevent the relapse of depression. Description: Learn and implement conflict resolution skills to resolve interpersonal problems. Target Date: 2023-08-05 Frequency: Daily Modality: individual Progress: 100%  Related Problem: Develop healthy thinking patterns and beliefs about self, others, and the world that lead to the alleviation and help prevent the relapse of depression. Description: Learn and implement problem-solving and decision-making skills. Target Date: 2024-08-04 Frequency: Daily Modality: individual Progress: 95%  Related Problem: Develop healthy thinking patterns and beliefs about self, others, and the world that lead to the alleviation and help prevent the relapse of depression. Description: Learn and implement  behavioral strategies to overcome depression. Target Date: 2023-02-03 Frequency: Daily Modality: individual Progress:100%  Related Problem: Develop healthy thinking patterns and beliefs about self, others, and the world that lead to the alleviation and help prevent the relapse of depression. Description: Identify and replace thoughts and beliefs that support depression. Target Date: 2023-08-05 Frequency: Daily Modality: individual Progress: 100%  Related Problem: Develop healthy thinking patterns and beliefs about self, others, and the world that lead to the alleviation and help prevent the relapse of depression. Description: Describe current and past experiences with depression including their impact on functioning and attempts to resolve it. Target Date: 2023-02-03 Frequency: Daily Modality: individual Progress: 100%  Related Problem: Reduce overall frequency, intensity, and duration of the anxiety so that daily functioning is not impaired. Description: Maintain involvement in work, family, and social activities. Target Date: 2023-02-03 Frequency: Daily Modality: individual Progress: 100%  Related Problem: Reduce overall frequency, intensity, and duration of the anxiety so that daily functioning is not impaired. Description: Learn to accept limitations in life and commit to tolerating, rather than avoiding, unpleasant emotions while accomplishing meaningful goals. Target Date: 2024-08-04 Frequency: Daily Modality: individual Progress: 90%  Related Problem: Reduce overall frequency, intensity, and duration of the  anxiety so that daily functioning is not impaired. Description: Learn and implement problem-solving strategies for realistically addressing worries. Target Date: 2024-08-04 Frequency: Daily Modality: individual Progress: 85%  Related Problem: Reduce overall frequency, intensity, and duration of the anxiety so that daily functioning is not impaired. Description: Identify,  challenge, and replace biased, fearful self-talk with positive, realistic, and empowering self-talk. Target Date: 2023-02-03 Frequency: Daily Modality: individual Progress: 100%  Related Problem: Reduce overall frequency, intensity, and duration of the anxiety so that daily functioning is not impaired. Description: Describe situations, thoughts, feelings, and actions associated with anxieties and worries, their impact on functioning, and attempts to resolve them. Target Date: 2024-08-04 Frequency: Daily Modality: individual Progress: 90%  Client Response full compliance  Service Location Location, [address] [place], [place] [address]  Service Code cpt [phone]  Lifestyle change (exercise, nutrition)  Self-monitoring  Normalize/Reframe  Facilitate problem solving  Identify/label emotions  Validate/empathize  Emotion regulation skills  Self care activities  Related past to present  Session Notes F33.2, F41.1.  Goals: Resolve grief related to the loss of her mother, foster independence, resolve guilt, manage unresolved family of origin issues, resolve feelings associated with failed marriage, address issues related to establishing new romantic relationship. Also, has some conflicts with her sons that are not resolved. Goal Date is 12-25.  Patient agrees to a video session and is aware of the limitations. She is at home and I am in my home office.   Pamela Ray: Her surgery was rescheduled for tomorrow because she was sick. Starts at 7:30am and she hopes to be home late morning. She says that she got sick on the family trip and she got Pamela Ray sick. She told Pamela Ray he didn't have to go and he got upset with her, saying "you make me feel like you don't want me to go". When he got sick, she was angry with him wanting to say to him "I told you so". She did not share with Pamela Ray that she was upset. When Pamela Ray got upset with her, it made her realize that there is some distrust in the relationship. We  talked about saying to each other "How are we"?                                                                          Jola Nash, PhD Time 5:10p-6:00p 50 min.

## 2023-12-29 ENCOUNTER — Encounter: Payer: Self-pay | Admitting: Family

## 2023-12-29 ENCOUNTER — Other Ambulatory Visit

## 2023-12-31 ENCOUNTER — Ambulatory Visit (INDEPENDENT_AMBULATORY_CARE_PROVIDER_SITE_OTHER): Admitting: Podiatrist

## 2023-12-31 DIAGNOSIS — B351 Tinea unguium: Secondary | ICD-10-CM

## 2023-12-31 NOTE — Progress Notes (Signed)
 Patient presents today for the laser treatment # 1. Diagnosed with mycotic nail infection by Dr. Hannah Lewis most affected are left first and second and right fifth toenail She relates she thinks the fungus started in the right fifth nail.  She experienced trauma to the left hallux nail.  It has been yellow and brown for some time.  Also is loose and not affixed the the 1/3 of the underlying nail bed.  There is some new nail in the proximal portion of the nail.  Left second toenail has superficial white onychomycosis as it appears clinically.   All other systems are negative.  Patients left hallux nail was trimmed back to try and lesson the likelihood it will catch and come off.  Then it was filed thin using a sterile burr.  Nails 2 left and 5 right were also filed thin.     Laser therapy via [facility] Laser therapy system was admininstered to affected toenails x 3  .  The Patient tolerated the treatment well. All safety precautions were in place.   Single laser pass was done on non-affected nails.   Follow up in 4 weeks for laser # 2  Also I took a proximal sample of the left hallux nail to see if fungal elements are present.  Will call with culture result

## 2024-01-01 ENCOUNTER — Ambulatory Visit: Admitting: Psychology

## 2024-01-08 ENCOUNTER — Other Ambulatory Visit: Payer: Self-pay | Admitting: Podiatrist

## 2024-01-14 ENCOUNTER — Ambulatory Visit: Payer: Self-pay | Admitting: Podiatrist

## 2024-01-14 MED ORDER — TERBINAFINE HCL 250 MG PO TABS: 250.0000 mg | ORAL_TABLET | Freq: Every day | ORAL | 0 refills | Status: AC

## 2024-01-15 ENCOUNTER — Ambulatory Visit: Admitting: Psychology

## 2024-01-29 ENCOUNTER — Ambulatory Visit: Admitting: Psychology

## 2024-02-12 ENCOUNTER — Ambulatory Visit: Admitting: Psychology

## 2024-02-12 DIAGNOSIS — F330 Major depressive disorder, recurrent, mild: Secondary | ICD-10-CM | POA: Diagnosis not present

## 2024-02-12 DIAGNOSIS — F411 Generalized anxiety disorder: Secondary | ICD-10-CM | POA: Diagnosis not present

## 2024-02-12 NOTE — Progress Notes (Signed)
 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 (Status: maintained) -- No Description Entered  Excessive and/or unrealistic worry that is difficult to control occurring more days than not for at least 6 months about a number of events or activities. (Status: maintained) -- No Description Entered  Feelings of hopelessness, worthlessness, or inappropriate guilt. (Status: maintained) -- No Description Entered  Medication Status compliance  Safety none  If Suicidal or Homicidal State Action Taken: unspecified  Current Risk: low Medications unspecified Objectives Related Problem: Develop healthy thinking patterns and beliefs about self, others, and the world that lead to the alleviation and help prevent the relapse of depression. Description: Learn and implement conflict resolution skills to resolve interpersonal problems. Target Date: 2023-08-05 Frequency: Daily Modality: individual Progress: 100%  Related Problem: Develop healthy thinking patterns and beliefs about self, others, and the world that lead to the alleviation and help prevent the relapse of depression. Description: Learn and implement problem-solving and decision-making skills. Target Date: 2024-08-04 Frequency: Daily Modality: individual Progress: 95%  Related Problem: Develop healthy thinking patterns and beliefs about self, others, and the world that lead to the alleviation and help prevent the relapse of  depression. Description: Learn and implement behavioral strategies to overcome depression. Target Date: 2023-02-03 Frequency: Daily Modality: individual Progress:100%  Related Problem: Develop healthy thinking patterns and beliefs about self, others, and the world that lead to the alleviation and help prevent the relapse of depression. Description: Identify and replace thoughts and beliefs that support depression. Target Date: 2023-08-05 Frequency: Daily Modality: individual Progress: 100%  Related Problem: Develop healthy thinking patterns and beliefs about self, others, and the world that lead to the alleviation and help prevent the relapse of depression. Description: Describe current and past experiences with depression including their impact on functioning and attempts to resolve it. Target Date: 2023-02-03 Frequency: Daily Modality: individual Progress: 100%  Related Problem: Reduce overall frequency, intensity, and duration of the anxiety so that daily functioning is not impaired. Description: Maintain involvement in work, family, and social activities. Target Date: 2023-02-03 Frequency: Daily Modality: individual Progress: 100%  Related Problem: Reduce overall frequency, intensity, and duration of the anxiety so that daily functioning is not impaired. Description: Learn to accept limitations in life and commit to tolerating, rather than avoiding, unpleasant emotions while accomplishing meaningful goals. Target Date: 2024-08-04 Frequency: Daily  Modality: individual Progress: 90%  Related Problem: Reduce overall frequency, intensity, and duration of the anxiety so that daily functioning is not impaired. Description: Learn and implement problem-solving strategies for realistically addressing worries. Target Date: 2024-08-04 Frequency: Daily Modality: individual Progress: 85%  Related Problem: Reduce overall frequency, intensity, and duration of the anxiety so that daily  functioning is not impaired. Description: Identify, challenge, and replace biased, fearful self-talk with positive, realistic, and empowering self-talk. Target Date: 2023-02-03 Frequency: Daily Modality: individual Progress: 100%  Related Problem: Reduce overall frequency, intensity, and duration of the anxiety so that daily functioning is not impaired. Description: Describe situations, thoughts, feelings, and actions associated with anxieties and worries, their impact on functioning, and attempts to resolve them. Target Date: 2024-08-04 Frequency: Daily Modality: individual Progress: 90%  Client Response full compliance  Service Location Location, [address] [place], [place] [address]  Service Code cpt [phone]  Lifestyle change (exercise, nutrition)  Self-monitoring  Normalize/Reframe  Facilitate problem solving  Identify/label emotions  Validate/empathize  Emotion regulation skills  Self care activities  Related past to present  Session Notes F33.2, F41.1.  Goals: Resolve grief related to the loss of her mother, foster independence, resolve guilt, manage unresolved family of origin issues, resolve feelings associated with failed marriage, address issues related to establishing new romantic relationship. Also, has some conflicts with her sons that are not resolved. Goal Date is 12-25.  Patient agrees to a video session and is aware of the limitations. She is at home and I am in my home office.   Pamela Ray: Says that she has not been staying at [employer] lately. She has to be at home taking care of the animals. She admits that it is a bit of an excuse and fits where they are in the relationship. She gave example of Pamela Ray doing things that make her feel he is not sensitive to understanding how his behavior, at times, sends the message that she isn't loved. She says he has only slept at her house maybe 4 times in 6 years and had dinner at her house a dozen times. She has shared that  she has feelings about this, but he isn't responsive. Pamela Ray has not been, however, especially clear with him that his house is ours and my house is mine. Discussed why certain conversations feel risky to her. It is related to former relationships in which her expectations have been missed in the past. Needs to be able to trust the relationship can handle those difficult situations/conversations.                                                                            CONI ALM KERNS, PhD Time 5:00p-5:45p 50 min.

## 2024-03-04 ENCOUNTER — Ambulatory Visit: Admitting: Family Medicine

## 2024-03-04 ENCOUNTER — Ambulatory Visit: Payer: Self-pay

## 2024-03-04 VITALS — BP 108/68 | HR 74 | Temp 98.7°F | Ht 65.0 in | Wt 146.2 lb

## 2024-03-04 DIAGNOSIS — T7840XA Allergy, unspecified, initial encounter: Secondary | ICD-10-CM | POA: Diagnosis not present

## 2024-03-04 DIAGNOSIS — H02849 Edema of unspecified eye, unspecified eyelid: Secondary | ICD-10-CM

## 2024-03-04 NOTE — Progress Notes (Signed)
 Subjective:  Patient ID: Pamela Ray, female    DOB: 06-09-1966  Age: 58 y.o. MRN: [id number]  CC:  Chief Complaint  Patient presents with   Facial Swelling    Pt is here today with C/O of lips itchin,eye swelling Pt states he OB-GYN gave her Fluonazole 150mg  and Metronidazole She took this medication 03/03/2024. She noticed facial swelling after shower later in the day OTC -Benadryl      HPI Mirren Gest presents for   Acute visit for above, PCP is Dr. Mahlon.  Possible allergic reaction Seen by her OB/GYN on July 15, wet prep with yeast and clue cells.  Treated with Diflucan 150 mg and metronidazole.  Took metronidazole at 8am and 9pm last night.  Took diflucan about 1pm yesterday.  R eyelid itchy yesterday morning - after taking metronidazole. Has taken flagyl and diflucan in past without reaction. Throughout day yesterday - more itchy eyelid, eyelid started to become more puffy throughout the day. More swelling of eyelid last night - took 1 benadryl . Cool compress. Woke up this morning and both eyes swollen.  Called GYN this am - advised to take antihistamine and be seen if not improved in . Some improvement after the chlorpheniramine 4mg .  Throat feels itchy since this am, lips feel itchy since yesterday, but no swelling of lips or tongue.  Drinking/swallowing ok. These started prior to use of chlorpheniramine.  No wheezing or dyspnea. No rash/hives.  GYN rx metrogel vaginal today in place  No new food, supplements or dermatologic products.    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 history   Personal history of breast cancer 09/26/2021   Raynaud's phenomenon (by history or observed) 10/11/2011   Seasonal allergies    Past Surgical History:  Procedure Laterality Date   HERNIA REPAIR     double 1999   MASTECTOMY, PARTIAL     TONSILLECTOMY     Allergies  Allergen Reactions   Amoxicillin Itching   Cephalexin Itching   Penicillins Hives   Venofer  [Iron  Sucrose] Palpitations    Patient medicated with acetaminophen  and diphenhydramine . Able to restart infusion and patient tolerated. See progress noted from 12/14/2021.   Prior to Admission medications   Medication Sig Start Date End Date Taking? Authorizing Provider  Boric Acid GRAN Insert 1 capsule in vagina every night x 14 days then every Monday and Thursday night indefinitely 10/16/18  Yes [provider]  BOTOX 100 units SOLR injection  10/04/23  Yes [provider]  Botulinum Toxin Type A, Cosm, 100 units SOLR  05/20/12  Yes [provider]  chlorproMAZINE (THORAZINE) 25 MG tablet  08/10/20  Yes [provider]  cloNIDine (CATAPRES) 0.1 MG tablet Take 0.1 mg by mouth at bedtime.   Yes [provider]  desonide (DESOWEN) 0.05 % ointment Apply topically. 05/12/13  Yes [provider]  estradiol  (ESTRACE ) 0.1 MG/GM vaginal cream Place 1 g vaginally 2 (two) times a week.   Yes [provider]  hydrocortisone 2.5 % ointment  06/23/19  Yes [provider]  magnesium (MAGTAB) 84 MG ( ) TBCR SR tablet Take 144 mg by mouth daily. Pt takes Neuro Mag Threonate daily  at bedtime   Yes [provider]  metroNIDAZOLE (FLAGYL) 500 MG tablet Take 500 mg by mouth. 03/02/24 03/09/24 Yes [provider]  metroNIDAZOLE (METROGEL) 0.75 % gel Apply topically. 03/04/24  Yes [provider]  misoprostol (CYTOTEC) 200 MCG tablet Take by mouth. 12/12/23  Yes [provider]  rosuvastatin  (CRESTOR ) 10 MG tablet TAKE 1 TABLET BY MOUTH DAILY 05/16/23  Yes Tabori, Katherine E, MD  SUMAtriptan (IMITREX) 100 MG tablet Take by mouth.   Yes [provider]  terbinafine  (LAMISIL ) 250 MG tablet Take 1 tablet (250 mg total) by mouth daily. 01/14/24  Yes Scherrie Lamarr SQUIBB, DPM  tretinoin (RETIN-A) 0.025 % cream SMARTSIG:1 Application Topical Every Evening 10/01/23  Yes [provider]  Vitamin D , Ergocalciferol , (DRISDOL ) 1.25 MG (50000 UNIT) CAPS capsule TAKE 1 CAPSULE BY MOUTH ONCE  WEEKLY 09/03/23  Yes Ennever, Maude SAUNDERS, MD   Social History   Socioeconomic History   Marital status: Significant Other    Spouse name: Not on file   Number of children: 2   Years of education: Not on file   Highest education level: Not on file  Occupational History   Occupation: HUMAN RESOURCE    Employer: [employer]  Tobacco Use   Smoking status: Never   Smokeless tobacco: Never   Tobacco comments:    never used tobacco  Vaping Use   Vaping status: Never Used  Substance and Sexual Activity   Alcohol use: No    Alcohol/week: 0.0 standard drinks of alcohol   Drug use: No   Sexual activity: Yes    Birth control/protection: None  Other Topics Concern   Not on file  Social History Narrative   Not on file   Social Drivers of Health   Financial Resource Strain: Not on file  Food Insecurity: Not on file  Transportation Needs: Not on file  Physical Activity: Not on file  Stress: Not on file  Social Connections: Unknown (12/02/2022)   Received from [facility]   Social Network    Social Network: Not on file  Intimate Partner  Violence: Unknown (12/02/2022)   Received from [facility]   HITS    Physically Hurt: Not on file    Insult or Talk Down To: Not on file    Threaten Physical Harm: Not on file    Scream or Curse: Not on file    Review of Systems   Objective:   Vitals:   03/04/24 1346  BP: 108/68  Pulse: 74  Temp: 98.7 F (37.1 C)  SpO2: 98%  Weight: 146 lb 4 oz (66.3 kg)  Height: 5' 5 (1.651 m)     Physical Exam Vitals reviewed.  Constitutional:      Appearance: Normal appearance. She is well-developed.  HENT:     Head: Normocephalic and atraumatic.     Mouth/Throat:     Mouth: Mucous membranes are moist.     Pharynx: No oropharyngeal exudate  or posterior oropharyngeal erythema.     Comments: No oral lesions, no lip or tongue swelling, clearing secretions normally, normal speech without distress.  No stridor. Eyes:     Conjunctiva/sclera: Conjunctivae normal.     Pupils: Pupils are equal, round, and reactive to light.     Comments: See photo, edema greater than lower eyelids, no rash, no hives, no visible swelling of face otherwise.  Neck:     Vascular: No carotid bruit.  Cardiovascular:     Rate and Rhythm: Normal rate and regular rhythm.     Heart sounds: Normal heart sounds.  Pulmonary:     Effort: Pulmonary effort is normal. No respiratory distress.     Breath sounds: Normal breath sounds. No wheezing, rhonchi or rales.  Abdominal:     Palpations: Abdomen is soft. There is no pulsatile mass.     Tenderness: There is no abdominal tenderness.  Musculoskeletal:     Right lower leg: No edema.     Left lower leg: No edema.  Skin:    General: Skin is warm and dry.  Neurological:     Mental Status: She is alert and oriented to person, place, and time.  Psychiatric:        Mood and Affect: Mood normal.        Behavior: Behavior normal.        Assessment & Plan:  Nikala Walsworth is a 58 y.o. female . Allergic reaction, initial encounter  Swelling of eyelid,  unspecified laterality Suspected allergic reaction to possibly metronidazole, less likely Diflucan based on time.  Symptoms are limited to face, no urticaria, no stridor or wheeze.  Itching sensation in mouth but clearing secretions, no oral lesions appreciated.  Outpatient treatment discussed with antihistamine, Zyrtec and Pepcid , short-term Benadryl  if needed.  Hold on further metronidazole p.o. and cautioned about topical Metronidazole gel,  but if she does use that medication watch for any new recurrence of symptoms and would wait until everything is cleared with current reaction.  ER precautions, RTC precautions given.  Follow-up message today later with some persistent symptoms when she woke up with swelling around her eyes.  Advised if not improving throughout the day can start prednisone  and 40 mg x 3 days prescribed with potential side effects and risk discussed on messaging.  ER precautions given.  No orders of the defined types were placed in this encounter.  Patient Instructions  Pepcid  over the counter, zyrtec once per day for next week. Can take additional benadryl  tonight if needed. No further metronidazole pills for now, then defer the metrogel until all symptoms have cleared, then be very cautious using metronidazole gel and stop if any return of swelling, irritation or itching.  Avoid eye makeup for now.   Return to the clinic or go to the nearest emergency room if any of your symptoms worsen or new symptoms occur.       Signed,   Reyes Pines, MD [facility] 03/04/24 2:38 PM

## 2024-03-04 NOTE — Patient Instructions (Addendum)
 Pepcid  over the counter, zyrtec once per day for next week. Can take additional benadryl  tonight if needed. No further metronidazole pills for now, then defer the metrogel until all symptoms have cleared, then be very cautious using metronidazole gel and stop if any return of swelling, irritation or itching.  Avoid eye makeup for now.   Return to the clinic or go to the nearest emergency room if any of your symptoms worsen or new symptoms occur.

## 2024-03-04 NOTE — Telephone Encounter (Signed)
 FYI Only or Action Required?: FYI only for provider.  Patient was last seen in primary care on 02/05/2023 by Mahlon Comer BRAVO, MD.  Called Nurse Triage reporting Allergic Reaction.  Symptoms began yesterday.  Interventions attempted: Prescription medications: flagyl.  Symptoms are: unchanged.  Triage Disposition: No disposition on file.  Patient/caregiver understands and will follow disposition?:      Copied from CRM [id number]. Topic: Clinical - Red Word Triage >> Mar 04, 2024  9:32 AM Mercedes MATSU wrote: Red Word that prompted transfer to Nurse Triage: Patient called in stating that she is having a allergic reaction to a  medication prescribed by her ob/gyn. She states she contacted her gynecologist and she informed the patient to take Claritin and if her symptoms don't clear up to go to the urgent care. Patient does not want to go to urgent care. Symptoms include, itchy throat, swollen mouth, swollen eyes. Reason for Disposition  Face swelling began after taking a drug  Answer Assessment - Initial Assessment Questions 1. ONSET: When did the swelling start? (e.g., minutes, hours, days)     Yesterday; states took medication from OBGYN flagyl, two pills 2. LOCATION: What part of the face is swollen? (e.g., cheek, entire face, jaw joint area, under jaw)     Eyes swollen, itchy throat, swollen mouth 3. SEVERITY: How swollen is it?     moderate 4. ITCHING: Is there any itching? If Yes, ask: How much?   (Scale 1-10; mild, moderate or severe)     Yes, mild 5. PAIN: Is the swelling painful to touch? If Yes, ask: How painful is it?   (Scale 0-10; mild, moderate or severe)     no 6. FEVER: Do you have a fever? If Yes, ask: What is it, how was it measured, and when did it start?      no 7. CAUSE: What do you think is causing the face swelling?     medication 8. NEW MEDICINES: Have there been any new medicines started recently?     flagyl 9. RECURRENT SYMPTOM: Have  you had face swelling before? If Yes, ask: When was the last time? What happened that time?     Yes, with bees wax 10. OTHER SYMPTOMS: Do you have any other symptoms? (e.g., leg swelling, toothache)       no 11. PREGNANCY: Is there any chance you are pregnant? When was your last menstrual period?       na  Protocols used: Face Swelling-A-AH

## 2024-03-05 ENCOUNTER — Other Ambulatory Visit: Payer: Self-pay | Admitting: Family Medicine

## 2024-03-05 ENCOUNTER — Ambulatory Visit (INDEPENDENT_AMBULATORY_CARE_PROVIDER_SITE_OTHER): Admitting: *Deleted

## 2024-03-05 ENCOUNTER — Encounter: Payer: Self-pay | Admitting: Family Medicine

## 2024-03-05 DIAGNOSIS — T7840XA Allergy, unspecified, initial encounter: Secondary | ICD-10-CM

## 2024-03-05 DIAGNOSIS — H02849 Edema of unspecified eye, unspecified eyelid: Secondary | ICD-10-CM

## 2024-03-05 DIAGNOSIS — B351 Tinea unguium: Secondary | ICD-10-CM

## 2024-03-05 MED ORDER — PREDNISONE 20 MG PO TABS: 40.0000 mg | ORAL_TABLET | Freq: Every day | ORAL | 0 refills | Status: AC

## 2024-03-05 NOTE — Progress Notes (Signed)
 See phone message regarding eyelid swelling, allergic reaction.  Prednisone  provided if not improving as the day goes on, with potential side effects and risks discussed on note.

## 2024-03-05 NOTE — Progress Notes (Signed)
 Patient presents today for the 2nd laser treatment.. Diagnosed with mycotic nail infection by Dr. Magdalen Done most affected are 1st and 2nd left and 5th right.  All other systems are negative.  Patients left hallux nail was trimmed back. Then it was filed thin using a sterile burr.  Nails 2 left and 5 right were also filed thin.     Laser therapy via [facility] therapy system was admininstered to affected toenails x 3  .  The Patient tolerated the treatment well. All safety precautions were in place.    Follow up in 4 weeks for laser # 3  Patient states Dr. Scherrie had prescribed her oral terbinafine  and she was unsure about taking it and had questions. I advised she could send a MyChart message to Dr. Scherrie with her concerns prior to taking.

## 2024-03-05 NOTE — Telephone Encounter (Signed)
 Patient Is still having swelling under left eye. Patient is asking if she can take zyrtec more than once per day?

## 2024-03-06 ENCOUNTER — Encounter: Payer: Self-pay | Admitting: Family Medicine

## 2024-03-11 ENCOUNTER — Ambulatory Visit: Admitting: Psychology

## 2024-03-17 ENCOUNTER — Encounter: Payer: Self-pay | Admitting: Podiatrist

## 2024-03-19 ENCOUNTER — Ambulatory Visit (INDEPENDENT_AMBULATORY_CARE_PROVIDER_SITE_OTHER): Admitting: Psychology

## 2024-03-19 DIAGNOSIS — F330 Major depressive disorder, recurrent, mild: Secondary | ICD-10-CM | POA: Diagnosis not present

## 2024-03-19 DIAGNOSIS — F411 Generalized anxiety disorder: Secondary | ICD-10-CM | POA: Diagnosis not present
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
## 2024-03-23 ENCOUNTER — Other Ambulatory Visit: Payer: Self-pay | Admitting: Podiatry

## 2024-03-23 DIAGNOSIS — Z79899 Other long term (current) drug therapy: Secondary | ICD-10-CM

## 2024-03-25 ENCOUNTER — Ambulatory Visit (INDEPENDENT_AMBULATORY_CARE_PROVIDER_SITE_OTHER): Admitting: Psychology

## 2024-03-25 DIAGNOSIS — F411 Generalized anxiety disorder: Secondary | ICD-10-CM | POA: Diagnosis not present

## 2024-03-25 DIAGNOSIS — F330 Major depressive disorder, recurrent, mild: Secondary | ICD-10-CM

## 2024-03-25 NOTE — Progress Notes (Signed)
 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 (Status: maintained) -- No Description Entered  Excessive and/or unrealistic worry that is difficult to control occurring more days than not for at least 6 months about a number of events or activities. (Status: maintained) -- No Description Entered  Feelings of hopelessness, worthlessness, or inappropriate guilt. (Status: maintained) -- No Description Entered  Medication Status compliance  Safety none  If Suicidal or Homicidal State Action Taken: unspecified  Current Risk: low Medications unspecified Objectives Related Problem: Develop healthy thinking patterns and beliefs about self, others, and the world that lead to the alleviation and help prevent the relapse of depression. Description: Learn and implement conflict resolution skills to resolve interpersonal problems. Target Date: 2023-08-05 Frequency: Daily Modality: individual Progress: 100%  Related Problem: Develop healthy thinking patterns and beliefs about self, others, and the world that lead to the alleviation and help prevent the relapse of depression. Description: Learn and implement problem-solving and decision-making skills. Target Date: 2024-08-04 Frequency: Daily Modality: individual Progress: 95%  Related Problem: Develop healthy thinking patterns and beliefs about self, others, and the world that lead to the alleviation and help prevent the relapse of depression. Description: Learn and implement behavioral strategies to overcome depression. Target Date: 2023-02-03 Frequency: Daily Modality: individual Progress:100%  Related Problem: Develop healthy thinking patterns and beliefs about self, others, and the world that lead to the alleviation and help prevent  the relapse of depression. Description: Identify and replace thoughts and beliefs that support depression. Target Date: 2023-08-05 Frequency: Daily Modality: individual Progress: 100%  Related Problem: Develop healthy thinking patterns and beliefs about self, others, and the world that lead to the alleviation and help prevent the relapse of depression. Description: Describe current and past experiences with depression including their impact on functioning and attempts to resolve it. Target Date: 2023-02-03 Frequency: Daily Modality: individual Progress: 100%  Related Problem: Reduce overall frequency, intensity, and duration of the anxiety so that daily functioning is not impaired. Description: Maintain involvement in work, family, and social activities. Target Date: 2023-02-03 Frequency: Daily Modality: individual Progress: 100%  Related Problem: Reduce overall frequency, intensity, and duration of the anxiety so that daily functioning is not impaired. Description: Learn to accept limitations in life and commit to tolerating, rather than avoiding, unpleasant emotions while accomplishing meaningful goals. Target Date: 2024-08-04 Frequency: Daily Modality: individual Progress: 90%  Related Problem: Reduce overall frequency, intensity, and duration of the anxiety so that daily functioning is not impaired. Description: Learn and implement problem-solving strategies for realistically addressing worries. Target Date: 2024-08-04 Frequency: Daily Modality: individual Progress: 85%  Related Problem: Reduce overall frequency, intensity, and duration of the anxiety so that daily functioning is not impaired. Description: Identify, challenge, and replace biased, fearful self-talk with positive, realistic, and empowering self-talk. Target Date: 2023-02-03 Frequency: Daily Modality: individual Progress: 100%  Related Problem: Reduce overall frequency, intensity, and duration of the anxiety so that  daily functioning is not impaired. Description: Describe situations, thoughts, feelings, and actions associated with anxieties and worries, their impact on functioning, and attempts to resolve them. Target Date: 2024-08-04 Frequency: Daily Modality: individual Progress: 90%  Client Response full compliance  Service Location Location, [address] [place], [place] [address]  Service Code cpt [id number]  Lifestyle change (exercise, nutrition)  Self-monitoring  Normalize/Reframe  Facilitate problem solving  Identify/label emotions  Validate/empathize  Emotion regulation skills  Self care activities  Related past to present  Session Notes F33.2, F41.1.  Goals: Resolve grief related to the loss of her mother, foster independence, resolve guilt, manage unresolved family of origin issues, resolve feelings associated with failed marriage, address issues related to establishing new romantic relationship. Also, has some conflicts with her sons that are not resolved. Goal Date is 12-25.  Patient agrees to a video session and is aware of the limitations. She is at home and I am in my home office.   Pamela Ray: She states that she did not have an intentional conversation with [name] since last session. He did acknowledge to her that he has not paid her enough attention and she was grateful for him sharing that with her. Pamela Ray says that she got a text from Autumn, saying that they are having a full transition (sex change). Has already scheduled the surgery. Pamela Ray most upset that she was told by text rather than a phone call. Pamela Ray knows she has proven to Autumn that they can count on her and trust her and is offended that there was not a direct phone call. Pamela Ray did not feel she could talk to Autumn about her feeling hurt regarding the text. She did, however, end up sharing her thoughts. Autumn did not consider any of Kathy's needs or schedule when making the appointment for surgery, and expects her to be  their care-taker. Pamela Ray is still trying to get used to the pronouns and conceptualizing Autumn as her daughter.                                                                             CONI ALM KERNS, PhD Time 5:10p-6:00p 50 min.

## 2024-04-05 ENCOUNTER — Encounter: Payer: Self-pay | Admitting: Family

## 2024-04-06 ENCOUNTER — Inpatient Hospital Stay: Attending: Hematology & Oncology

## 2024-04-06 ENCOUNTER — Telehealth: Payer: Self-pay | Admitting: Nurse Practitioner

## 2024-04-06 ENCOUNTER — Other Ambulatory Visit: Payer: Self-pay | Admitting: *Deleted

## 2024-04-06 DIAGNOSIS — D508 Other iron deficiency anemias: Secondary | ICD-10-CM

## 2024-04-06 DIAGNOSIS — Z923 Personal history of irradiation: Secondary | ICD-10-CM | POA: Diagnosis not present

## 2024-04-06 DIAGNOSIS — Z08 Encounter for follow-up examination after completed treatment for malignant neoplasm: Secondary | ICD-10-CM | POA: Diagnosis present

## 2024-04-06 DIAGNOSIS — D509 Iron deficiency anemia, unspecified: Secondary | ICD-10-CM | POA: Insufficient documentation

## 2024-04-06 DIAGNOSIS — Z86000 Personal history of in-situ neoplasm of breast: Secondary | ICD-10-CM | POA: Insufficient documentation

## 2024-04-06 DIAGNOSIS — E559 Vitamin D deficiency, unspecified: Secondary | ICD-10-CM | POA: Diagnosis not present

## 2024-04-06 LAB — VITAMIN D 25 HYDROXY (VIT D DEFICIENCY, FRACTURES): Vit D, 25-Hydroxy: 61.66 ng/mL (ref 30–100)

## 2024-04-06 LAB — CBC WITH DIFFERENTIAL (CANCER CENTER ONLY)
Abs Immature Granulocytes: 0.01 K/uL (ref 0.00–0.07)
Basophils Absolute: 0.1 K/uL (ref 0.0–0.1)
Basophils Relative: 1 %
Eosinophils Absolute: 0.1 K/uL (ref 0.0–0.5)
Eosinophils Relative: 2 %
HCT: 44.6 % (ref 36.0–46.0)
Hemoglobin: 14.9 g/dL (ref 12.0–15.0)
Immature Granulocytes: 0 %
Lymphocytes Relative: 34 %
Lymphs Abs: 1.9 K/uL (ref 0.7–4.0)
MCH: 28.7 pg (ref 26.0–34.0)
MCHC: 33.4 g/dL (ref 30.0–36.0)
MCV: 85.9 fL (ref 80.0–100.0)
Monocytes Absolute: 0.6 K/uL (ref 0.1–1.0)
Monocytes Relative: 10 %
Neutro Abs: 3 K/uL (ref 1.7–7.7)
Neutrophils Relative %: 53 %
Platelet Count: 218 K/uL (ref 150–400)
RBC: 5.19 MIL/uL — ABNORMAL HIGH (ref 3.87–5.11)
RDW: 13.3 % (ref 11.5–15.5)
WBC Count: 5.7 K/uL (ref 4.0–10.5)
nRBC: 0 % (ref 0.0–0.2)

## 2024-04-06 LAB — CMP (CANCER CENTER ONLY)
ALT: 10 U/L (ref 0–44)
AST: 16 U/L (ref 15–41)
Albumin: 4.3 g/dL (ref 3.5–5.0)
Alkaline Phosphatase: 60 U/L (ref 38–126)
Anion gap: 4 — ABNORMAL LOW (ref 5–15)
BUN: 13 mg/dL (ref 6–20)
CO2: 31 mmol/L (ref 22–32)
Calcium: 9.6 mg/dL (ref 8.9–10.3)
Chloride: 105 mmol/L (ref 98–111)
Creatinine: 0.59 mg/dL (ref 0.44–1.00)
GFR, Estimated: >60 mL/min (ref >60)
Glucose, Bld: 100 mg/dL — ABNORMAL HIGH (ref 70–99)
Potassium: 4 mmol/L (ref 3.5–5.1)
Sodium: 140 mmol/L (ref 135–145)
Total Bilirubin: 0.4 mg/dL (ref 0.0–1.2)
Total Protein: 6.8 g/dL (ref 6.5–8.1)

## 2024-04-06 LAB — IRON AND IRON BINDING CAPACITY (CC-WL,HP ONLY)
Iron: 53 ug/dL (ref 28–170)
Saturation Ratios: 13 % (ref 10.4–31.8)
TIBC: 416 ug/dL (ref 250–450)
UIBC: 363 ug/dL (ref 148–442)

## 2024-04-07 LAB — FERRITIN: Ferritin: 38 ng/mL (ref 11–307)

## 2024-04-09 ENCOUNTER — Other Ambulatory Visit

## 2024-04-12 ENCOUNTER — Ambulatory Visit: Payer: Self-pay | Admitting: Medical Oncology

## 2024-04-22 ENCOUNTER — Ambulatory Visit: Admitting: Psychology

## 2024-05-06 ENCOUNTER — Ambulatory Visit: Admitting: Psychology

## 2024-05-07 ENCOUNTER — Encounter: Payer: Self-pay | Admitting: Podiatry

## 2024-05-07 ENCOUNTER — Ambulatory Visit (INDEPENDENT_AMBULATORY_CARE_PROVIDER_SITE_OTHER): Payer: Self-pay

## 2024-05-07 DIAGNOSIS — B351 Tinea unguium: Secondary | ICD-10-CM

## 2024-05-07 NOTE — Progress Notes (Signed)
 Patient presents today for the 3rd laser treatment. Diagnosed with mycotic nail infection by Dr. Magdalen.   Toenail most affected left 1st & 2nd, right 5th.  All other systems are negative.  Nails were filed thin. Laser therapy was administered to  left 1st & 2nd, right 5th toenails and patient tolerated the treatment well. All safety precautions were in place.   Post treatment instructions reviewed and provided to patient. Patient had no questions regarding plan of care.   Patient reported allergic reaction to Lamisil  that was originally prescribed by Dr. DOROTHA Fees (GYN with [facility]). She is sending a message to Dr. Magdalen with this information. I did add Lamisil  to her allergy list.   Follow up in 6 weeks for laser # 4.

## 2024-05-13 ENCOUNTER — Encounter: Payer: Self-pay | Admitting: Family

## 2024-05-17 ENCOUNTER — Telehealth: Payer: Self-pay | Admitting: Family

## 2024-05-17 NOTE — Telephone Encounter (Signed)
 Called to schedule 5 doses of IV Iron  with pt. Scheduled first 2 doses, but pt noted she would be out of town with a family matter starting the following week. Pt will schedule remaining 3 irons when she is back in town.

## 2024-05-20 ENCOUNTER — Ambulatory Visit: Admitting: Psychology

## 2024-05-28 ENCOUNTER — Inpatient Hospital Stay: Attending: Hematology & Oncology

## 2024-05-28 VITALS — BP 96/59 | HR 70 | Temp 97.9°F | Resp 16

## 2024-05-28 DIAGNOSIS — D508 Other iron deficiency anemias: Secondary | ICD-10-CM

## 2024-05-28 DIAGNOSIS — D509 Iron deficiency anemia, unspecified: Secondary | ICD-10-CM | POA: Insufficient documentation

## 2024-05-28 MED ORDER — DIPHENHYDRAMINE HCL 25 MG PO CAPS: 50.0000 mg | ORAL_CAPSULE | Freq: Once | ORAL | Status: DC

## 2024-05-28 MED ORDER — SODIUM CHLORIDE 0.9 % IV SOLN: INTRAVENOUS | Status: DC

## 2024-05-28 MED ORDER — IRON SUCROSE 20 MG/ML IV SOLN
200.0000 mg | Freq: Once | INTRAVENOUS | Status: AC
  Administered 2024-05-28: 200 mg via INTRAVENOUS
  Filled 2024-05-28: qty 10

## 2024-05-28 MED ORDER — ACETAMINOPHEN 325 MG PO TABS: 650.0000 mg | ORAL_TABLET | Freq: Once | ORAL | Status: DC

## 2024-05-28 NOTE — Patient Instructions (Signed)
 [facility] - A DEPT OF [facility]  Discharge Instructions: Thank you for choosing [facility] to provide your oncology and hematology care.   If you have a lab appointment with the Cancer Center, please go directly to the Cancer Center and check in at the registration area.  Wear comfortable clothing and clothing appropriate for easy access to any Portacath or PICC line.   We strive to give you quality time with your provider. You may need to reschedule your appointment if you arrive late (15 or more minutes).  Arriving late affects you and other patients whose appointments are after yours.  Also, if you miss three or more appointments without notifying the office, you may be dismissed from the clinic at the provider's discretion.      For prescription refill requests, have your pharmacy contact our office and allow 72 hours for refills to be completed.    Today you received the following  agents Venofer       To help prevent nausea and vomiting after your treatment, we encourage you to take your nausea medication as directed.  BELOW ARE SYMPTOMS THAT SHOULD BE REPORTED IMMEDIATELY: *FEVER GREATER THAN 100.4 F (38 C) OR HIGHER *CHILLS OR SWEATING *NAUSEA AND VOMITING THAT IS NOT CONTROLLED WITH YOUR NAUSEA MEDICATION *UNUSUAL SHORTNESS OF BREATH *UNUSUAL BRUISING OR BLEEDING *URINARY PROBLEMS (pain or burning when urinating, or frequent urination) *BOWEL PROBLEMS (unusual diarrhea, constipation, pain near the anus) TENDERNESS IN MOUTH AND THROAT WITH OR WITHOUT PRESENCE OF ULCERS (sore throat, sores in mouth, or a toothache) UNUSUAL RASH, SWELLING OR PAIN  UNUSUAL VAGINAL DISCHARGE OR ITCHING   Items with * indicate a potential emergency and should be followed up as soon as possible or go to the Emergency Department if any problems should occur.  Please show the CHEMOTHERAPY ALERT CARD or IMMUNOTHERAPY ALERT CARD at check-in to the  Emergency Department and triage nurse. Should you have questions after your visit or need to cancel or reschedule your appointment, please contact [facility] - A DEPT OF [facility]  [phone] and follow the prompts.  Office hours are 8:00 a.m. to 4:30 p.m. Monday - Friday. Please note that voicemails left after 4:00 p.m. may not be returned until the following business day.  We are closed weekends and major holidays. You have access to a nurse at all times for urgent questions. Please call the main number to the clinic [phone] and follow the prompts.  For any non-urgent questions, you may also contact your provider using MyChart. We now offer e-Visits for anyone 58 and older to request care online for non-urgent symptoms. For details visit [email or web address].   Also download the MyChart app! Go to the app store, search MyChart, open the app, select [place], and log in with your MyChart username and password. Iron  Sucrose Injection What is this medication? IRON  SUCROSE (EYE ern SOO krose) treats low levels of iron  (iron  deficiency anemia) in people with kidney disease. Iron  is a mineral that plays an important role in making red blood cells, which carry oxygen from your lungs to the rest of your body. This medicine may be used for other purposes; ask your health care provider or pharmacist if you have questions. COMMON BRAND NAME(S): Venofer  What should I tell my care team before I take this medication? They need to know if you have any of these conditions: Anemia not caused by low  iron  levels Heart disease High levels of iron  in the blood Kidney disease Liver disease An unusual or allergic reaction to iron , other medications, foods, dyes, or preservatives Pregnant or trying to get pregnant Breastfeeding How should I use this medication? This medication is for infusion into a vein. It is given in a hospital or clinic setting. Talk to your  care team about the use of this medication in children. While this medication may be prescribed for children as young as 2 years for selected conditions, precautions do apply. Overdosage: If you think you have taken too much of this medicine contact a poison control center or emergency room at once. NOTE: This medicine is only for you. Do not share this medicine with others. What if I miss a dose? Keep appointments for follow-up doses. It is important not to miss your dose. Call your care team if you are unable to keep an appointment. What may interact with this medication? Do not take this medication with any of the following: Deferoxamine Dimercaprol Other iron  products This medication may also interact with the following: Chloramphenicol Deferasirox This list may not describe all possible interactions. Give your health care provider a list of all the medicines, herbs, non-prescription drugs, or dietary supplements you use. Also tell them if you smoke, drink alcohol, or use illegal drugs. Some items may interact with your medicine. What should I watch for while using this medication? Visit your care team regularly. Tell your care team if your symptoms do not start to get better or if they get worse. You may need blood work done while you are taking this medication. You may need to follow a special diet. Talk to your care team. Foods that contain iron  include: whole grains/cereals, dried fruits, beans, or peas, leafy green vegetables, and organ meats (liver, kidney). What side effects may I notice from receiving this medication? Side effects that you should report to your care team as soon as possible: Allergic reactions--skin rash, itching, hives, swelling of the face, lips, tongue, or throat Low blood pressure--dizziness, feeling faint or lightheaded, blurry vision Shortness of breath Side effects that usually do not require medical attention (report to your care team if they continue or are  bothersome): Flushing Headache Joint pain Muscle pain Nausea Pain, redness, or irritation at injection site This list may not describe all possible side effects. Call your doctor for medical advice about side effects. You may report side effects to FDA at 1-800-FDA-1088. Where should I keep my medication? This medication is given in a hospital or clinic. It will not be stored at home. NOTE: This sheet is a summary. It may not cover all possible information. If you have questions about this medicine, talk to your doctor, pharmacist, or health care provider.  2024 Elsevier/Gold Standard (2023-01-10 00:00:00)

## 2024-06-03 ENCOUNTER — Ambulatory Visit (INDEPENDENT_AMBULATORY_CARE_PROVIDER_SITE_OTHER): Admitting: Psychology

## 2024-06-03 DIAGNOSIS — F411 Generalized anxiety disorder: Secondary | ICD-10-CM

## 2024-06-03 DIAGNOSIS — F330 Major depressive disorder, recurrent, mild: Secondary | ICD-10-CM

## 2024-06-03 NOTE — Progress Notes (Signed)
 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 (Status: maintained) -- No Description Entered  Excessive and/or unrealistic worry that is difficult to control occurring more days than not for at least 6 months about a number of events or activities. (Status: maintained) -- No Description Entered  Feelings of hopelessness, worthlessness, or inappropriate guilt. (Status: maintained) -- No Description Entered  Medication Status compliance  Safety none  If Suicidal or Homicidal State Action Taken: unspecified  Current Risk: low Medications unspecified Objectives Related Problem: Develop healthy thinking patterns and beliefs about self, others, and the world that lead to the alleviation and help prevent the relapse of depression. Description: Learn and implement conflict resolution skills to resolve interpersonal problems. Target Date: 2023-08-05 Frequency: Daily Modality: individual Progress: 100%  Related Problem: Develop healthy thinking patterns and beliefs about self, others, and the world that lead to the alleviation and help prevent the relapse of depression. Description: Learn and implement problem-solving and decision-making skills. Target Date: 2024-08-04 Frequency: Daily Modality: individual Progress: 95%  Related Problem: Develop healthy thinking patterns and beliefs about self, others, and the world that lead to the alleviation and help prevent the relapse of depression. Description: Learn and implement behavioral strategies to overcome depression. Target Date: 2023-02-03 Frequency: Daily Modality: individual Progress:100%  Related Problem: Develop healthy thinking patterns and beliefs about self, others, and the world that lead to  the alleviation and help prevent the relapse of depression. Description: Identify and replace thoughts and beliefs that support depression. Target Date: 2023-08-05 Frequency: Daily Modality: individual Progress: 100%  Related Problem: Develop healthy thinking patterns and beliefs about self, others, and the world that lead to the alleviation and help prevent the relapse of depression. Description: Describe current and past experiences with depression including their impact on functioning and attempts to resolve it. Target Date: 2023-02-03 Frequency: Daily Modality: individual Progress: 100%  Related Problem: Reduce overall frequency, intensity, and duration of the anxiety so that daily functioning is not impaired. Description: Maintain involvement in work, family, and social activities. Target Date: 2023-02-03 Frequency: Daily Modality: individual Progress: 100%  Related Problem: Reduce overall frequency, intensity, and duration of the anxiety so that daily functioning is not impaired. Description: Learn to accept limitations in life and commit to tolerating, rather than avoiding, unpleasant emotions while accomplishing meaningful goals. Target Date: 2024-08-04 Frequency: Daily Modality: individual Progress: 90%  Related Problem: Reduce overall frequency, intensity, and duration of the anxiety so that daily functioning is not impaired. Description: Learn and implement problem-solving strategies for realistically addressing worries. Target Date: 2024-08-04 Frequency: Daily Modality: individual Progress: 85%  Related Problem: Reduce overall frequency, intensity, and duration of the anxiety so that daily functioning is not impaired. Description: Identify, challenge, and replace biased, fearful self-talk with positive, realistic, and empowering self-talk. Target Date: 2023-02-03 Frequency: Daily Modality: individual Progress: 100%  Related Problem: Reduce overall frequency, intensity,  and duration of the anxiety so that daily functioning is not impaired. Description: Describe situations, thoughts, feelings, and actions associated with anxieties and worries, their impact on functioning, and attempts to resolve them. Target Date: 2024-08-04 Frequency: Daily Modality: individual Progress: 90%  Client  Response full compliance  Service Location Location, [address] [place], [place] [address]  Service Code cpt [id number]  Lifestyle change (exercise, nutrition)  Self-monitoring  Normalize/Reframe  Facilitate problem solving  Identify/label emotions  Validate/empathize  Emotion regulation skills  Self care activities  Related past to present  Session Notes F33.2, F41.1.  Goals: Resolve grief related to the loss of her mother, foster independence, resolve guilt, manage unresolved family of origin issues, resolve feelings associated with failed marriage, address issues related to establishing new romantic relationship. Also, has some conflicts with her sons that are not resolved. Goal Date is 12-25.  Patient agrees to a video session and is aware of the limitations. She is at home and I am in my home office.   Pamela Ray: Wants to dive deeper into why she cares so much about what [name]'s son doug is doing. She thinks part of it is about how Doug's behavior effects [place]. She also wants to talk about daughter Autumn and wants to better understand the issues associated with being trans. She is having trouble reconciling her burly, bearded son is now aligned with being a woman.  She says that her brother is an ass and is suffering the consequences of never taking care of himself. He has had multiple strokes and now having toes amputated because of not taking care of his diabetes. She is angry that she cannot take care of him and feels guilty, and angry that he did this to himself. Will provide referral to her for a therapist who will help her better understand Autumn. Nathanel says  that Doug's wife is having second thoughts about many of the things that she told Georgette she wanted or would be willing to do. She doesn't want to have kids, friends or stay in the [place] . Doug confides in [place] about all of these problems in his relationship. Told her that [name] needs to tell son to get into therapy to resolve issues.                                                                                CONI ALM KERNS, PhD Time 4:10p-5:00p 50 min.

## 2024-06-04 ENCOUNTER — Other Ambulatory Visit

## 2024-06-04 ENCOUNTER — Inpatient Hospital Stay

## 2024-06-04 VITALS — BP 105/86 | HR 70 | Temp 98.2°F | Resp 17

## 2024-06-04 DIAGNOSIS — D509 Iron deficiency anemia, unspecified: Secondary | ICD-10-CM | POA: Diagnosis not present

## 2024-06-04 DIAGNOSIS — D508 Other iron deficiency anemias: Secondary | ICD-10-CM

## 2024-06-04 MED ORDER — IRON SUCROSE 20 MG/ML IV SOLN
200.0000 mg | Freq: Once | INTRAVENOUS | Status: AC
  Administered 2024-06-04: 200 mg via INTRAVENOUS
  Filled 2024-06-04: qty 10

## 2024-06-04 MED ORDER — SODIUM CHLORIDE 0.9 % IV SOLN: INTRAVENOUS | Status: DC

## 2024-06-04 NOTE — Patient Instructions (Signed)

## 2024-06-04 NOTE — Progress Notes (Signed)
 Pt states she took premeds prior to arrival in clinic

## 2024-06-17 ENCOUNTER — Ambulatory Visit: Admitting: Psychology

## 2024-06-25 ENCOUNTER — Inpatient Hospital Stay: Attending: Hematology & Oncology

## 2024-06-25 ENCOUNTER — Inpatient Hospital Stay

## 2024-06-25 ENCOUNTER — Ambulatory Visit: Admitting: Family

## 2024-06-25 VITALS — BP 114/76 | HR 64 | Temp 98.1°F | Resp 17

## 2024-06-25 DIAGNOSIS — D508 Other iron deficiency anemias: Secondary | ICD-10-CM

## 2024-06-25 DIAGNOSIS — D509 Iron deficiency anemia, unspecified: Secondary | ICD-10-CM | POA: Diagnosis present

## 2024-06-25 DIAGNOSIS — C9000 Multiple myeloma not having achieved remission: Secondary | ICD-10-CM

## 2024-06-25 MED ORDER — SODIUM CHLORIDE 0.9 % IV SOLN: INTRAVENOUS | Status: DC

## 2024-06-25 MED ORDER — DIPHENHYDRAMINE HCL 25 MG PO CAPS
50.0000 mg | ORAL_CAPSULE | Freq: Once | ORAL | Status: DC
  Filled 2024-06-25: qty 2

## 2024-06-25 MED ORDER — ACETAMINOPHEN 325 MG PO TABS
650.0000 mg | ORAL_TABLET | Freq: Once | ORAL | Status: DC
  Filled 2024-06-25: qty 2

## 2024-06-25 MED ORDER — IRON SUCROSE 20 MG/ML IV SOLN
200.0000 mg | Freq: Once | INTRAVENOUS | Status: AC
  Administered 2024-06-25: 200 mg via INTRAVENOUS
  Filled 2024-06-25: qty 10

## 2024-06-25 NOTE — Patient Instructions (Signed)

## 2024-06-29 ENCOUNTER — Ambulatory Visit (INDEPENDENT_AMBULATORY_CARE_PROVIDER_SITE_OTHER): Payer: Self-pay

## 2024-06-29 DIAGNOSIS — B351 Tinea unguium: Secondary | ICD-10-CM

## 2024-06-29 NOTE — Patient Instructions (Signed)

## 2024-06-29 NOTE — Progress Notes (Signed)
 Patient presents today for the 4th laser treatment. Diagnosed with mycotic nail infection by Dr. Magdalen.   Toenail most affected left 1st & 2nd, right 5th.  All other systems are negative.  Nails were filed thin. Laser therapy was administered to left 1st & 2nd, right 5th toenails and patient tolerated the treatment well. All safety precautions were in place.   Post treatment instructions reviewed and provided to patient. Patient had no questions regarding plan of care.   Follow up in 6 weeks for laser # 5.

## 2024-07-02 ENCOUNTER — Inpatient Hospital Stay

## 2024-07-02 VITALS — BP 100/61 | HR 71 | Temp 98.1°F | Resp 16

## 2024-07-02 DIAGNOSIS — D509 Iron deficiency anemia, unspecified: Secondary | ICD-10-CM | POA: Diagnosis not present

## 2024-07-02 DIAGNOSIS — D508 Other iron deficiency anemias: Secondary | ICD-10-CM

## 2024-07-02 MED ORDER — ACETAMINOPHEN 325 MG PO TABS: 650.0000 mg | ORAL_TABLET | Freq: Once | ORAL | Status: DC

## 2024-07-02 MED ORDER — DIPHENHYDRAMINE HCL 25 MG PO CAPS: 50.0000 mg | ORAL_CAPSULE | Freq: Once | ORAL | Status: DC

## 2024-07-02 MED ORDER — IRON SUCROSE 20 MG/ML IV SOLN
200.0000 mg | Freq: Once | INTRAVENOUS | Status: AC
  Administered 2024-07-02: 200 mg via INTRAVENOUS
  Filled 2024-07-02: qty 10

## 2024-07-02 NOTE — Patient Instructions (Signed)

## 2024-07-09 ENCOUNTER — Inpatient Hospital Stay

## 2024-07-09 VITALS — BP 108/70 | HR 67 | Temp 98.0°F | Resp 19 | Ht 66.0 in | Wt 148.0 lb

## 2024-07-09 DIAGNOSIS — D508 Other iron deficiency anemias: Secondary | ICD-10-CM

## 2024-07-09 DIAGNOSIS — D509 Iron deficiency anemia, unspecified: Secondary | ICD-10-CM | POA: Diagnosis not present

## 2024-07-09 MED ORDER — IRON SUCROSE 20 MG/ML IV SOLN
200.0000 mg | Freq: Once | INTRAVENOUS | Status: AC
  Administered 2024-07-09: 200 mg via INTRAVENOUS
  Filled 2024-07-09: qty 10

## 2024-07-09 MED ORDER — ACETAMINOPHEN 325 MG PO TABS: 650.0000 mg | ORAL_TABLET | Freq: Once | ORAL | Status: DC

## 2024-07-09 MED ORDER — DIPHENHYDRAMINE HCL 25 MG PO CAPS: 50.0000 mg | ORAL_CAPSULE | Freq: Once | ORAL | Status: DC

## 2024-07-09 MED ORDER — SODIUM CHLORIDE 0.9 % IV SOLN: Freq: Once | INTRAVENOUS | Status: AC

## 2024-07-29 ENCOUNTER — Ambulatory Visit: Admitting: Psychology

## 2024-08-01 ENCOUNTER — Encounter: Payer: Self-pay | Admitting: Podiatry

## 2024-08-03 ENCOUNTER — Telehealth: Payer: Self-pay | Admitting: Podiatry

## 2024-08-03 NOTE — Telephone Encounter (Signed)
 Patient called in asking about the topical antifungal cream that Dr. Magdalen recommends she would like to know the name of it.

## 2024-08-05 NOTE — Telephone Encounter (Signed)
 Formula 7 for nails

## 2024-08-08 ENCOUNTER — Other Ambulatory Visit: Payer: Self-pay | Admitting: Hematology & Oncology

## 2024-08-08 DIAGNOSIS — Z8249 Family history of ischemic heart disease and other diseases of the circulatory system: Secondary | ICD-10-CM

## 2024-08-08 DIAGNOSIS — R002 Palpitations: Secondary | ICD-10-CM

## 2024-08-08 DIAGNOSIS — D508 Other iron deficiency anemias: Secondary | ICD-10-CM

## 2024-08-08 DIAGNOSIS — M818 Other osteoporosis without current pathological fracture: Secondary | ICD-10-CM

## 2024-08-08 DIAGNOSIS — M810 Age-related osteoporosis without current pathological fracture: Secondary | ICD-10-CM

## 2024-08-08 DIAGNOSIS — D0512 Intraductal carcinoma in situ of left breast: Secondary | ICD-10-CM

## 2024-08-08 DIAGNOSIS — R739 Hyperglycemia, unspecified: Secondary | ICD-10-CM

## 2024-08-08 DIAGNOSIS — F064 Anxiety disorder due to known physiological condition: Secondary | ICD-10-CM

## 2024-08-08 DIAGNOSIS — E78019 Familial hypercholesterolemia, unspecified: Secondary | ICD-10-CM

## 2024-08-08 DIAGNOSIS — E559 Vitamin D deficiency, unspecified: Secondary | ICD-10-CM

## 2024-08-08 DIAGNOSIS — D509 Iron deficiency anemia, unspecified: Secondary | ICD-10-CM

## 2024-08-09 ENCOUNTER — Ambulatory Visit: Admitting: Psychology

## 2024-08-09 ENCOUNTER — Encounter: Payer: Self-pay | Admitting: Family

## 2024-08-09 DIAGNOSIS — F411 Generalized anxiety disorder: Secondary | ICD-10-CM | POA: Diagnosis not present

## 2024-08-09 NOTE — Progress Notes (Signed)
 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 (Status: maintained) -- No Description Entered  Excessive and/or unrealistic worry that is difficult to control occurring more days than not for at least 6 months about a number of events or activities. (Status: maintained) -- No Description Entered  Feelings of hopelessness, worthlessness, or inappropriate guilt. (Status: maintained) -- No Description Entered  Medication Status compliance  Safety none  If Suicidal or Homicidal State Action Taken: unspecified  Current Risk: low Medications unspecified Objectives Related Problem: Develop healthy thinking patterns and beliefs about self, others, and the world that lead to the alleviation and help prevent the relapse of depression. Description: Learn and implement conflict resolution skills to resolve interpersonal problems. Target Date: 2023-08-05 Frequency: Daily Modality: individual Progress: 100%  Related Problem: Develop healthy thinking patterns and beliefs about self, others, and the world that lead to the alleviation and help prevent the relapse of depression. Description: Learn and implement problem-solving and decision-making skills. Target Date: 2025-08-04 Frequency: Daily Modality: individual Progress: 95%  Related Problem: Develop healthy thinking patterns and beliefs about self, others, and the world that lead to the alleviation and help prevent the relapse of depression. Description: Learn and implement behavioral strategies to overcome depression. Target Date: 2023-02-03 Frequency: Daily Modality: individual Progress:100%  Related Problem: Develop healthy thinking patterns and beliefs about self, others,  and the world that lead to the alleviation and help prevent the relapse of depression. Description: Identify and replace thoughts and beliefs that support depression. Target Date: 2023-08-05 Frequency: Daily Modality: individual Progress: 100%  Related Problem: Develop healthy thinking patterns and beliefs about self, others, and the world that lead to the alleviation and help prevent the relapse of depression. Description: Describe current and past experiences with depression including their impact on functioning and attempts to resolve it. Target Date: 2023-02-03 Frequency: Daily Modality: individual Progress: 100%  Related Problem: Reduce overall frequency, intensity, and duration of the anxiety so that daily functioning is not impaired. Description: Maintain involvement in work, family, and social activities. Target Date: 2023-02-03 Frequency: Daily Modality: individual Progress: 100%  Related Problem: Reduce overall frequency, intensity, and duration of the anxiety so that daily functioning is not impaired. Description: Learn to accept limitations in life and commit to tolerating, rather than avoiding, unpleasant emotions while accomplishing meaningful goals. Target Date: 2025-08-04 Frequency: Daily Modality: individual Progress: 90%  Related Problem: Reduce overall frequency, intensity, and duration of the anxiety so that daily functioning is not impaired. Description: Learn and implement problem-solving strategies for realistically addressing worries. Target Date: 2025-08-04 Frequency: Daily Modality: individual Progress: 90%  Related Problem: Reduce overall frequency, intensity, and duration of the anxiety so that daily functioning is not impaired. Description: Identify, challenge, and replace biased, fearful self-talk with positive, realistic, and empowering self-talk. Target Date: 2023-02-03 Frequency: Daily Modality: individual Progress: 100%  Related Problem: Reduce  overall frequency, intensity, and duration of the anxiety so that daily functioning is not impaired. Description: Describe situations, thoughts, feelings, and actions associated with anxieties and worries, their impact on functioning,  and attempts to resolve them. Target Date: 2025-08-04 Frequency: Daily Modality: individual Progress: 90%  Client Response full compliance  Service Location Location, [address] [place], [place] [address]  Service Code cpt [phone]  Lifestyle change (exercise, nutrition)  Self-monitoring  Normalize/Reframe  Facilitate problem solving  Identify/label emotions  Validate/empathize  Emotion regulation skills  Self care activities  Related past to present  Session Notes F33.2, F41.1.  Goals: Resolve grief related to the loss of her mother, foster independence, resolve guilt, manage unresolved family of origin issues, resolve feelings associated with failed marriage, address issues related to establishing new romantic relationship. Also, has some conflicts with her sons that are not resolved. Goal Date is 12-26.   Patient agrees to a video session and is aware of the limitations. She is at home and I am in my home office.   Pamela Ray was born in October and she was in [place]  for 3 weeks. Then she found out that her ex had to have by-pass surgery and her son came to town to help take care of him. Her son and family stayed with her. She had to deal with Pamela Ray's drama, which was frustrating. Pamela Ray is expecting Pamela Ray to be available for her gender affirming surgery. Pamela Ray is getting more anxious as the surgery date gets closer. She is also concerned that son Pamela Ray will distance himself and his family from Pamela Ray and maybe not allow his children to be around Pamela Ray. She is not doing well with this transition. States she is overwhelmed and not getting things accomplished. Claims she is feeling beat down. Encouraged to to talk to Pamela Ray about being realistic  with regard to expectations of others. She is struggling with her place. She said that Pamela Ray went to [place]  to be with family in [place] . She planned to be alone, telling Pamela Ray to spend the day with his father and Pamela Ray was to be with her father. This isn't sure of her place and insecure in her relationship with Pamela Ray. Encouraged her to talk with him about that feeling.                                                                                   CONI ALM KERNS, PhD Time 11:40a-12:30a 50 min.       "

## 2024-08-19 ENCOUNTER — Encounter: Payer: Self-pay | Admitting: Family

## 2024-08-21 ENCOUNTER — Encounter: Payer: Self-pay | Admitting: Family

## 2024-08-24 ENCOUNTER — Ambulatory Visit

## 2024-08-26 ENCOUNTER — Ambulatory Visit (INDEPENDENT_AMBULATORY_CARE_PROVIDER_SITE_OTHER): Admitting: Psychology

## 2024-08-26 DIAGNOSIS — F411 Generalized anxiety disorder: Secondary | ICD-10-CM | POA: Diagnosis not present

## 2024-08-26 DIAGNOSIS — F332 Major depressive disorder, recurrent severe without psychotic features: Secondary | ICD-10-CM | POA: Diagnosis not present

## 2024-08-26 NOTE — Progress Notes (Signed)
 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 (Status: maintained) -- No Description Entered  Excessive and/or unrealistic worry that is difficult to control occurring more days than not for at least 6 months about a number of events or activities. (Status: maintained) -- No Description Entered  Feelings of hopelessness, worthlessness, or inappropriate guilt. (Status: maintained) -- No Description Entered  Medication Status compliance  Safety none  If Suicidal or Homicidal State Action Taken: unspecified  Current Risk: low Medications unspecified Objectives Related Problem: Develop healthy thinking patterns and beliefs about self, others, and the world that lead to the alleviation and help prevent the relapse of depression. Description: Learn and implement conflict resolution skills to resolve interpersonal problems. Target Date: 2023-08-05 Frequency: Daily Modality: individual Progress: 100%  Related Problem: Develop healthy thinking patterns and beliefs about self, others, and the world that lead to the alleviation and help prevent the relapse of depression. Description: Learn and implement problem-solving and decision-making skills. Target Date: 2025-08-04 Frequency: Daily Modality: individual Progress: 95%  Related Problem: Develop healthy thinking patterns and beliefs about self, others, and the world that lead to the alleviation and help prevent the relapse of depression. Description: Learn and implement behavioral strategies to overcome depression. Target Date: 2023-02-03 Frequency: Daily Modality: individual Progress:100%  Related Problem: Develop healthy thinking patterns and  beliefs about self, others, and the world that lead to the alleviation and help prevent the relapse of depression. Description: Identify and replace thoughts and beliefs that support depression. Target Date: 2023-08-05 Frequency: Daily Modality: individual Progress: 100%  Related Problem: Develop healthy thinking patterns and beliefs about self, others, and the world that lead to the alleviation and help prevent the relapse of depression. Description: Describe current and past experiences with depression including their impact on functioning and attempts to resolve it. Target Date: 2023-02-03 Frequency: Daily Modality: individual Progress: 100%  Related Problem: Reduce overall frequency, intensity, and duration of the anxiety so that daily functioning is not impaired. Description: Maintain involvement in work, family, and social activities. Target Date: 2023-02-03 Frequency: Daily Modality: individual Progress: 100%  Related Problem: Reduce overall frequency, intensity, and duration of the anxiety so that daily functioning is not impaired. Description: Learn to accept limitations in life and commit to tolerating, rather than avoiding, unpleasant emotions while accomplishing meaningful goals. Target Date: 2025-08-04 Frequency: Daily Modality: individual Progress: 90%  Related Problem: Reduce overall frequency, intensity, and duration of the anxiety so that daily functioning is not impaired. Description: Learn and implement problem-solving strategies for realistically addressing worries. Target Date: 2025-08-04 Frequency: Daily Modality: individual Progress: 90%  Related Problem: Reduce overall frequency, intensity, and duration of the anxiety so that daily functioning is not impaired. Description: Identify, challenge, and replace biased, fearful self-talk with positive, realistic, and empowering self-talk. Target Date: 2023-02-03 Frequency: Daily Modality: individual Progress: 100%   Related Problem: Reduce overall frequency, intensity, and duration of the anxiety so that daily functioning is not impaired. Description:  Describe situations, thoughts, feelings, and actions associated with anxieties and worries, their impact on functioning, and attempts to resolve them. Target Date: 2025-08-04 Frequency: Daily Modality: individual Progress: 90%  Client Response full compliance  Service Location Location, [address] [place], [place] [address]  Service Code cpt [phone]  Lifestyle change (exercise, nutrition)  Self-monitoring  Normalize/Reframe  Facilitate problem solving  Identify/label emotions  Validate/empathize  Emotion regulation skills  Self care activities  Related past to present  Session Notes F33.2, F41.1.  Goals: Resolve grief related to the loss of her mother, foster independence, resolve guilt, manage unresolved family of origin issues, resolve feelings associated with failed marriage, address issues related to establishing new romantic relationship. Also, has some conflicts with her sons that are not resolved. Goal Date is 12-26.   Patient agrees to a video session and is aware of the limitations. She is at home and I am in my home office.   Pamela Ray: She says holiday went well and she is relieved it is over. On NYE, she and [name] hosted some couples at their house. That was a good experience, but she says she overate and felt sick. She was going to stay the night with him, but felt so bad she wanted to go home. She came back the next morning before 9am. Had breakfast and left at 11:30  to walk with a friend. Came home and she felt terrible. She called [name] and told him and then fell asleep for most the day. She called him after her nap and  felt that, once again she had to go to his house. She ended up staying home because he was [place]. Next day they were to go to the beach together. He came to her house the next morning to say to her  we didn't have a good day yesterday. Made her feel that he didn't believe she was really sick. He told her that he questioned whether she wanted to be in their relationship anymore and he has questioned this for a while. She told him that she is insecure and that she isn't sure what he plans to do in the future, like moving away. She told him that she fits into his life, but he does not make effort to be in her life. They are in a good place having talked and her only regret is that she did not talk with [name] earlier about her feelings.                                                                                         CONI ALM KERNS, PhD Time 5:10p-6:00p 50 min.       "

## 2024-08-27 ENCOUNTER — Other Ambulatory Visit

## 2024-08-27 ENCOUNTER — Ambulatory Visit: Admitting: Family

## 2024-09-09 ENCOUNTER — Ambulatory Visit: Admitting: Psychology

## 2024-09-09 DIAGNOSIS — F411 Generalized anxiety disorder: Secondary | ICD-10-CM

## 2024-09-09 NOTE — Progress Notes (Signed)
 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 (Status: maintained) -- No Description Entered  Excessive and/or unrealistic worry that is difficult to control occurring more days than not for at least 6 months about a number of events or activities. (Status: maintained) -- No Description Entered  Feelings of hopelessness, worthlessness, or inappropriate guilt. (Status: maintained) -- No Description Entered  Medication Status compliance  Safety none  If Suicidal or Homicidal State Action Taken: unspecified  Current Risk: low Medications unspecified Objectives Related Problem: Develop healthy thinking patterns and beliefs about self, others, and the world that lead to the alleviation and help prevent the relapse of depression. Description: Learn and implement conflict resolution skills to resolve interpersonal problems. Target Date: 2023-08-05 Frequency: Daily Modality: individual Progress: 100%  Related Problem: Develop healthy thinking patterns and beliefs about self, others, and the world that lead to the alleviation and help prevent the relapse of depression. Description: Learn and implement problem-solving and decision-making skills. Target Date: 2025-08-04 Frequency: Daily Modality: individual Progress: 95%  Related Problem: Develop healthy thinking patterns and beliefs about self, others, and the world that lead to the alleviation and help prevent the relapse of depression. Description: Learn and implement behavioral strategies to overcome depression. Target Date: 2023-02-03 Frequency: Daily Modality: individual Progress:100%  Related Problem:  Develop healthy thinking patterns and beliefs about self, others, and the world that lead to the alleviation and help prevent the relapse of depression. Description: Identify and replace thoughts and beliefs that support depression. Target Date: 2023-08-05 Frequency: Daily Modality: individual Progress: 100%  Related Problem: Develop healthy thinking patterns and beliefs about self, others, and the world that lead to the alleviation and help prevent the relapse of depression. Description: Describe current and past experiences with depression including their impact on functioning and attempts to resolve it. Target Date: 2023-02-03 Frequency: Daily Modality: individual Progress: 100%  Related Problem: Reduce overall frequency, intensity, and duration of the anxiety so that daily functioning is not impaired. Description: Maintain involvement in work, family, and social activities. Target Date: 2023-02-03 Frequency: Daily Modality: individual Progress: 100%  Related Problem: Reduce overall frequency, intensity, and duration of the anxiety so that daily functioning is not impaired. Description: Learn to accept limitations in life and commit to tolerating, rather than avoiding, unpleasant emotions while accomplishing meaningful goals. Target Date: 2025-08-04 Frequency: Daily Modality: individual Progress: 90%  Related Problem: Reduce overall frequency, intensity, and duration of the anxiety so that daily functioning is not impaired. Description: Learn and implement problem-solving strategies for realistically addressing worries. Target Date: 2025-08-04 Frequency: Daily Modality: individual Progress: 90%  Related Problem: Reduce overall frequency, intensity, and duration of the anxiety so that daily functioning is not impaired. Description: Identify, challenge, and replace biased, fearful self-talk with positive, realistic, and empowering self-talk. Target Date: 2023-02-03 Frequency:  Daily Modality: individual Progress: 100%  Related Problem: Reduce overall  frequency, intensity, and duration of the anxiety so that daily functioning is not impaired. Description: Describe situations, thoughts, feelings, and actions associated with anxieties and worries, their impact on functioning, and attempts to resolve them. Target Date: 2025-08-04 Frequency: Daily Modality: individual Progress: 90%  Client Response full compliance  Service Location Location, [address] [place], [place] [address]  Service Code cpt [phone]  Lifestyle change (exercise, nutrition)  Self-monitoring  Normalize/Reframe  Facilitate problem solving  Identify/label emotions  Validate/empathize  Emotion regulation skills  Self care activities  Related past to present  Session Notes F33.2, F41.1.  Goals: Resolve grief related to the loss of her mother, foster independence, resolve guilt, manage unresolved family of origin issues, resolve feelings associated with failed marriage, address issues related to establishing new romantic relationship. Also, has some conflicts with her sons that are not resolved. Goal Date is 12-26.   Patient agrees to a video session and is aware of the limitations. She is at home and I am in my home office.   Pamela Ray: States she went to [place]  this weekend. Both she and Pamela Ray went to see his granddaughter. Pamela Ray (Pamela Ray's father) told them that Pamela Ray is very attached to her mother Pamela Ray. Pamela Ray is 8 and made an issue of the fact that Pamela Ray and Pamela Ray are not married. Pamela Ray says that she feels judged and this is a big problem for her. We discussed how to address this with Pamela Ray's son and daughter in law, without needing to be apologetic.                                                                                           Pamela ALM KERNS, PhD Time 5:10p-6:00p 50 min.       "

## 2024-09-10 ENCOUNTER — Ambulatory Visit (INDEPENDENT_AMBULATORY_CARE_PROVIDER_SITE_OTHER)

## 2024-09-10 DIAGNOSIS — B351 Tinea unguium: Secondary | ICD-10-CM

## 2024-09-10 NOTE — Progress Notes (Signed)
 Patient presents today for the 6th laser treatment. Diagnosed with mycotic nail infection by Dr. Magdalen.   Toenail most affected left 1st, 2nd and right 5th.  Patient is not happy with treatment progress, requesting a topical or oral treatment. Spoke with Dr. Magdalen and patient was recommended Formula 7 to be purchased at the front desk.  All other systems are negative.  Nails were filed thin. Laser therapy was administered to left 1st-2nd and right 5th toenails  and patient tolerated the treatment well. All safety precautions were in place.   Post treatment instructions reviewed and provided to patient. Patient had no questions regarding plan of care.   Follow up in 8 weeks for laser # 6.

## 2024-09-16 ENCOUNTER — Telehealth: Payer: Self-pay

## 2024-09-16 NOTE — Telephone Encounter (Signed)
 Patient called and left a message - she purchased Formula & in our office last week - she is concerned she got the wrong medicine -the bottle reads not for use on hair or nails. Patient prefers MyChart messaging for response -thanks

## 2024-09-23 ENCOUNTER — Ambulatory Visit: Admitting: Psychology

## 2024-09-23 DIAGNOSIS — F330 Major depressive disorder, recurrent, mild: Secondary | ICD-10-CM

## 2024-09-23 DIAGNOSIS — F411 Generalized anxiety disorder: Secondary | ICD-10-CM

## 2024-09-23 NOTE — Progress Notes (Signed)
 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 (Status: maintained) -- No Description Entered  Excessive and/or unrealistic worry that is difficult to control occurring more days than not for at least 6 months about a number of events or activities. (Status: maintained) -- No Description Entered  Feelings of hopelessness, worthlessness, or inappropriate guilt. (Status: maintained) -- No Description Entered  Medication Status compliance  Safety none  If Suicidal or Homicidal State Action Taken: unspecified  Current Risk: low Medications unspecified Objectives Related Problem: Develop healthy thinking patterns and beliefs about self, others, and the world that lead to the alleviation and help prevent the relapse of depression. Description: Learn and implement conflict resolution skills to resolve interpersonal problems. Target Date: 2023-08-05 Frequency: Daily Modality: individual Progress: 100%  Related Problem: Develop healthy thinking patterns and beliefs about self, others, and the world that lead to the alleviation and help prevent the relapse of depression. Description: Learn and implement problem-solving and decision-making skills. Target Date: 2025-08-04 Frequency: Daily Modality: individual Progress: 95%  Related Problem: Develop healthy thinking patterns and beliefs about self, others, and the world that lead to the alleviation and help prevent the relapse of depression. Description: Learn and implement behavioral strategies to overcome depression. Target Date: 2023-02-03 Frequency: Daily Modality:  individual Progress:100%  Related Problem: Develop healthy thinking patterns and beliefs about self, others, and the world that lead to the alleviation and help prevent the relapse of depression. Description: Identify and replace thoughts and beliefs that support depression. Target Date: 2023-08-05 Frequency: Daily Modality: individual Progress: 100%  Related Problem: Develop healthy thinking patterns and beliefs about self, others, and the world that lead to the alleviation and help prevent the relapse of depression. Description: Describe current and past experiences with depression including their impact on functioning and attempts to resolve it. Target Date: 2023-02-03 Frequency: Daily Modality: individual Progress: 100%  Related Problem: Reduce overall frequency, intensity, and duration of the anxiety so that daily functioning is not impaired. Description: Maintain involvement in work, family, and social activities. Target Date: 2023-02-03 Frequency: Daily Modality: individual Progress: 100%  Related Problem: Reduce overall frequency, intensity, and duration of the anxiety so that daily functioning is not impaired. Description: Learn to accept limitations in life and commit to tolerating, rather than avoiding, unpleasant emotions while accomplishing meaningful goals. Target Date: 2025-08-04 Frequency: Daily Modality: individual Progress: 90%  Related Problem: Reduce overall frequency, intensity, and duration of the anxiety so that daily functioning is not impaired. Description: Learn and implement problem-solving strategies for realistically addressing worries. Target Date: 2025-08-04 Frequency: Daily Modality: individual Progress: 90%  Related Problem: Reduce overall frequency, intensity, and duration of the anxiety so that daily functioning is not impaired. Description: Identify, challenge, and replace biased, fearful self-talk with positive, realistic, and empowering  self-talk. Target Date: 2023-02-03 Frequency: Daily Modality: individual Progress: 100%  Related Problem: Reduce overall frequency, intensity, and duration of the anxiety so that daily functioning is not impaired. Description: Describe situations, thoughts, feelings, and actions associated with anxieties and worries, their impact on functioning, and attempts to resolve them. Target Date: 2025-08-04 Frequency: Daily Modality: individual Progress: 90%  Client Response full compliance  Service Location Location, [address] [place], [place] [address]  Service Code cpt [phone]  Lifestyle change (exercise, nutrition)  Self-monitoring  Normalize/Reframe  Facilitate problem solving  Identify/label emotions  Validate/empathize  Emotion regulation skills  Self care activities  Related past to present  Session Notes F33.2, F41.1.  Goals: Resolve grief related to the loss of her mother, foster independence, resolve guilt, manage unresolved family of origin issues, resolve feelings associated with failed marriage, address issues related to establishing new romantic relationship. Also, has some conflicts with her sons that are not resolved. Goal Date is 12-26.   Patient agrees to a video session and is aware of the limitations. She is at home and I am in my home office.   Nathanel: She sent me an e-mail with a series of texts with Autumn. These texts were expressions of her frustration and upset about not feeling supported in their effort to transition. We discussed Autumn's accusations and Kathy's response. Addressed some of her options in talking with Autumn.                                                                                              CONI ALM KERNS, PhD Time 5:10p-6:00p 50 min.       "

## 2024-09-24 ENCOUNTER — Encounter: Payer: Self-pay | Admitting: Family

## 2024-09-24 ENCOUNTER — Inpatient Hospital Stay: Payer: Self-pay | Attending: Hematology & Oncology

## 2024-09-24 ENCOUNTER — Inpatient Hospital Stay: Payer: Self-pay | Admitting: Family

## 2024-09-24 ENCOUNTER — Other Ambulatory Visit: Payer: Self-pay

## 2024-09-24 DIAGNOSIS — D509 Iron deficiency anemia, unspecified: Secondary | ICD-10-CM

## 2024-09-24 DIAGNOSIS — E559 Vitamin D deficiency, unspecified: Secondary | ICD-10-CM

## 2024-09-24 DIAGNOSIS — D0512 Intraductal carcinoma in situ of left breast: Secondary | ICD-10-CM

## 2024-09-24 LAB — CMP (CANCER CENTER ONLY)
ALT: 14 U/L (ref 0–44)
AST: 20 U/L (ref 15–41)
Albumin: 4.5 g/dL (ref 3.5–5.0)
Alkaline Phosphatase: 76 U/L (ref 38–126)
Anion gap: 9 (ref 5–15)
BUN: 15 mg/dL (ref 6–20)
CO2: 27 mmol/L (ref 22–32)
Calcium: 9.7 mg/dL (ref 8.9–10.3)
Chloride: 103 mmol/L (ref 98–111)
Creatinine: 0.56 mg/dL (ref 0.44–1.00)
GFR, Estimated: >60 mL/min
Glucose, Bld: 117 mg/dL — ABNORMAL HIGH (ref 70–99)
Potassium: 4.4 mmol/L (ref 3.5–5.1)
Sodium: 139 mmol/L (ref 135–145)
Total Bilirubin: 0.5 mg/dL (ref 0.0–1.2)
Total Protein: 6.8 g/dL (ref 6.5–8.1)

## 2024-09-24 LAB — IRON AND IRON BINDING CAPACITY (CC-WL,HP ONLY)
Iron: 103 ug/dL (ref 28–170)
Saturation Ratios: 29 % (ref 10.4–31.8)
TIBC: 360 ug/dL (ref 250–450)
UIBC: 257 ug/dL

## 2024-09-24 LAB — CBC
HCT: 44.2 % (ref 36.0–46.0)
Hemoglobin: 14.9 g/dL (ref 12.0–15.0)
MCH: 28.9 pg (ref 26.0–34.0)
MCHC: 33.7 g/dL (ref 30.0–36.0)
MCV: 85.7 fL (ref 80.0–100.0)
Platelets: 222 10*3/uL (ref 150–400)
RBC: 5.16 MIL/uL — ABNORMAL HIGH (ref 3.87–5.11)
RDW: 13.6 % (ref 11.5–15.5)
WBC: 6.2 10*3/uL (ref 4.0–10.5)
nRBC: 0 % (ref 0.0–0.2)

## 2024-09-24 LAB — VITAMIN D 25 HYDROXY (VIT D DEFICIENCY, FRACTURES): Vit D, 25-Hydroxy: 30.3 ng/mL (ref 30–100)

## 2024-09-24 LAB — FERRITIN: Ferritin: 388 ng/mL — ABNORMAL HIGH (ref 11–307)

## 2024-09-24 NOTE — Progress Notes (Unsigned)
 " Hematology and Oncology Follow Up Visit  Pamela Ray [id number] 09/05/1965 59 y.o. 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 She is     Mammogram in 06/2024 was negative.   ECOG Performance Status: 1 - Symptomatic but completely ambulatory  Medications:  Allergies as of 09/24/2024       Reactions   Fluconazole Itching, Other (See Comments)   Swelling of eyes and swelling of face   Lamisil  At [terbinafine  Hcl] Swelling   Facial   Amoxicillin Itching   Cephalexin Itching   Penicillins Hives   Venofer  [iron  Sucrose] Palpitations   Patient medicated with acetaminophen  and diphenhydramine . Able to restart infusion and patient tolerated. See progress noted from 12/14/2021.        Medication List        Accurate as of September 24, 2024  2:55 PM. If you have any questions, ask your nurse or doctor.          Boric Acid Gran Insert 1 capsule in vagina every night x 14 days then every Monday and Thursday night indefinitely   Botox 100 units Solr injection Generic drug: botulinum toxin Type A   Botulinum Toxin Type A (Cosm) 100 units Solr   chlorproMAZINE 25 MG tablet Commonly known as: THORAZINE   cloNIDine 0.1 MG tablet Commonly known as: CATAPRES Take 0.1 mg by mouth at bedtime.   desonide 0.05 % ointment Commonly known as: DESOWEN Apply topically.   estradiol  0.1 MG/GM vaginal cream Commonly known as: ESTRACE  Place 1 g vaginally 2 (two) times a week.   fluconazole 150 MG tablet Commonly known as: DIFLUCAN Take 150 mg by mouth once.   hydrocortisone 2.5 % ointment   magnesium 84 MG ( ) Tbcr SR tablet Commonly known as:  MAGTAB Take 144 mg by mouth daily. Pt takes Neuro Mag Threonate daily at bedtime   methocarbamol 500 MG tablet Commonly known as: ROBAXIN Take 500 mg by mouth.   metroNIDAZOLE 0.75 % gel Commonly known as: METROGEL Apply topically.   misoprostol 200 MCG tablet Commonly known as: CYTOTEC Take by mouth.   predniSONE  20 MG tablet Commonly known as: DELTASONE  Take 2 tablets (40 mg total) by mouth daily with breakfast.   rosuvastatin  10 MG tablet Commonly known as: CRESTOR  TAKE 1 TABLET BY MOUTH DAILY   SUMAtriptan 100 MG tablet Commonly known as: IMITREX Take by mouth.   terbinafine  250 MG tablet Commonly known as: LAMISIL  Take 1 tablet (250 mg total) by mouth daily.   tretinoin 0.025 % cream Commonly known as: RETIN-A SMARTSIG:1 Application Topical Every Evening   Vitamin D  (Ergocalciferol ) 1.25 MG (50000 UNIT) Caps capsule Commonly known as: DRISDOL  TAKE 1 CAPSULE BY MOUTH ONCE  WEEKLY        Allergies: Allergies[1]  Past Medical History, Surgical history, Social history, and Family History were reviewed and updated.  Review of Systems: All other 10 point review of systems is negative.   Physical Exam:  height is 5' 6 (1.676 m) and weight is 148 lb (67.1 kg). Her oral temperature is 98  F (36.7 C). Her blood pressure is 117/80 and her pulse is 84. Her respiration is 18 and oxygen saturation is 99%.   Wt Readings from Last 3 Encounters:  09/24/24 148 lb (67.1 kg)  07/09/24 148 lb (67.1 kg)  03/04/24 146 lb 4 oz (66.3 kg)    Ocular: Sclerae unicteric, pupils equal, round and reactive to light Ear-nose-throat: Oropharynx clear, dentition fair Lymphatic: No cervical or supraclavicular adenopathy Lungs no rales or rhonchi, good excursion bilaterally Heart regular rate and rhythm, no murmur appreciated Abd soft, nontender, positive bowel sounds MSK no focal spinal tenderness, no joint edema Neuro: non-focal, well-oriented, appropriate affect Breasts:  Deferred   Lab Results  Component Value Date   WBC 6.2 09/24/2024   HGB 14.9 09/24/2024   HCT 44.2 09/24/2024   MCV 85.7 09/24/2024   PLT 222 09/24/2024   Lab Results  Component Value Date   FERRITIN 38 04/06/2024   IRON  53 04/06/2024   TIBC 416 04/06/2024   UIBC 363 04/06/2024   IRONPCTSAT 13 04/06/2024   Lab Results  Component Value Date   RETICCTPCT 1.4 05/20/2023   RBC 5.16 (H) 09/24/2024   RETICCTABS 48.9 05/12/2015   No results found for: KPAFRELGTCHN, LAMBDASER, KAPLAMBRATIO No results found for: IGGSERUM, IGA, IGMSERUM No results found for: STEPHANY CARLOTA BENSON MARKEL EARLA JOANNIE DOC VICK, SPEI   Chemistry      Component Value Date/Time   NA 140 04/06/2024 1611   NA 143 10/23/2022 1612   NA 141 07/25/2017 1131   NA 140 12/04/2016 0747   K 4.0 04/06/2024 1611   K 3.9 07/25/2017 1131   K 4.2 12/04/2016 0747   CL 105 04/06/2024 1611   CL 108 07/25/2017 1131   CO2 31 04/06/2024 1611   CO2 27 07/25/2017 1131   CO2 25 12/04/2016 0747   BUN 13 04/06/2024 1611   BUN 12 10/23/2022 1612   BUN 12 07/25/2017 1131   BUN 13.2 12/04/2016 0747   CREATININE 0.59 04/06/2024 1611   CREATININE 0.7 07/25/2017 1131   CREATININE 0.7 12/04/2016 0747      Component Value Date/Time   CALCIUM  9.6 04/06/2024 1611   CALCIUM  9.4 07/25/2017 1131   CALCIUM  9.3 12/04/2016 0747   ALKPHOS 60 04/06/2024 1611   ALKPHOS 65 07/25/2017 1131   ALKPHOS 64 12/04/2016 0747   AST 16 04/06/2024 1611   AST 17 12/04/2016 0747   ALT 10 04/06/2024 1611   ALT 20 07/25/2017 1131   ALT 10 12/04/2016 0747   BILITOT 0.4 04/06/2024 1611   BILITOT 0.73 12/04/2016 0747       Impression and Plan: Pamela Ray is a very pleasant 59 y.o.caucasian female with 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 She had a lumpectomy followed by radiation and has completed therapy of Fareston . She is also followed for IDA and vitamin D  deficiency.    Lauraine Pepper, NP 2/6/20262:55 PM     [1]  Allergies Allergen Reactions   Fluconazole Itching and Other (See Comments)    Swelling of eyes and swelling of face   Lamisil  At [Terbinafine  Hcl] Swelling    Facial    Amoxicillin Itching   Cephalexin Itching   Penicillins Hives   Venofer  [Iron  Sucrose] Palpitations    Patient medicated with acetaminophen  and diphenhydramine . Able to restart infusion and patient tolerated. See progress noted from 12/14/2021.   "

## 2024-10-07 ENCOUNTER — Ambulatory Visit: Admitting: Psychology

## 2024-10-21 ENCOUNTER — Ambulatory Visit: Admitting: Psychology

## 2024-10-22 ENCOUNTER — Ambulatory Visit

## 2024-11-04 ENCOUNTER — Ambulatory Visit: Admitting: Psychology

## 2024-11-18 ENCOUNTER — Ambulatory Visit: Admitting: Psychology

## 2024-12-02 ENCOUNTER — Ambulatory Visit: Admitting: Psychology

## 2024-12-16 ENCOUNTER — Ambulatory Visit: Admitting: Psychology
